# Patient Record
Sex: Male | Born: 1960 | Hispanic: No | Marital: Single | Smoking: Former smoker
Health system: Southern US, Community
[De-identification: ages and names within clinical notes are randomized; demographics above are authoritative.]

## PROBLEM LIST (undated history)

## (undated) DIAGNOSIS — J45909 Unspecified asthma, uncomplicated: Secondary | ICD-10-CM

## (undated) DIAGNOSIS — L409 Psoriasis, unspecified: Secondary | ICD-10-CM

## (undated) DIAGNOSIS — F1291 Cannabis use, unspecified, in remission: Secondary | ICD-10-CM

## (undated) DIAGNOSIS — I77810 Thoracic aortic ectasia: Secondary | ICD-10-CM

## (undated) DIAGNOSIS — I700 Atherosclerosis of aorta: Secondary | ICD-10-CM

## (undated) DIAGNOSIS — M47816 Spondylosis without myelopathy or radiculopathy, lumbar region: Secondary | ICD-10-CM

## (undated) DIAGNOSIS — Z87442 Personal history of urinary calculi: Secondary | ICD-10-CM

## (undated) DIAGNOSIS — I2510 Atherosclerotic heart disease of native coronary artery without angina pectoris: Secondary | ICD-10-CM

## (undated) DIAGNOSIS — G4730 Sleep apnea, unspecified: Secondary | ICD-10-CM

## (undated) DIAGNOSIS — J189 Pneumonia, unspecified organism: Secondary | ICD-10-CM

## (undated) DIAGNOSIS — E559 Vitamin D deficiency, unspecified: Secondary | ICD-10-CM

## (undated) DIAGNOSIS — I5189 Other ill-defined heart diseases: Secondary | ICD-10-CM

## (undated) DIAGNOSIS — R7303 Prediabetes: Secondary | ICD-10-CM

## (undated) DIAGNOSIS — Z8619 Personal history of other infectious and parasitic diseases: Secondary | ICD-10-CM

## (undated) DIAGNOSIS — M87051 Idiopathic aseptic necrosis of right femur: Secondary | ICD-10-CM

## (undated) DIAGNOSIS — N529 Male erectile dysfunction, unspecified: Secondary | ICD-10-CM

## (undated) DIAGNOSIS — A528 Late syphilis, latent: Secondary | ICD-10-CM

## (undated) DIAGNOSIS — L309 Dermatitis, unspecified: Secondary | ICD-10-CM

## (undated) DIAGNOSIS — D709 Neutropenia, unspecified: Secondary | ICD-10-CM

## (undated) DIAGNOSIS — Z87898 Personal history of other specified conditions: Secondary | ICD-10-CM

## (undated) DIAGNOSIS — M4185 Other forms of scoliosis, thoracolumbar region: Secondary | ICD-10-CM

## (undated) DIAGNOSIS — B20 Human immunodeficiency virus [HIV] disease: Secondary | ICD-10-CM

## (undated) DIAGNOSIS — F419 Anxiety disorder, unspecified: Secondary | ICD-10-CM

## (undated) DIAGNOSIS — R200 Anesthesia of skin: Secondary | ICD-10-CM

## (undated) DIAGNOSIS — J439 Emphysema, unspecified: Secondary | ICD-10-CM

## (undated) DIAGNOSIS — M545 Low back pain, unspecified: Secondary | ICD-10-CM

## (undated) DIAGNOSIS — M1990 Unspecified osteoarthritis, unspecified site: Secondary | ICD-10-CM

## (undated) DIAGNOSIS — E785 Hyperlipidemia, unspecified: Secondary | ICD-10-CM

## (undated) DIAGNOSIS — I10 Essential (primary) hypertension: Secondary | ICD-10-CM

## (undated) DIAGNOSIS — Z860100 Personal history of colon polyps, unspecified: Secondary | ICD-10-CM

## (undated) DIAGNOSIS — G8929 Other chronic pain: Secondary | ICD-10-CM

## (undated) DIAGNOSIS — J449 Chronic obstructive pulmonary disease, unspecified: Secondary | ICD-10-CM

## (undated) DIAGNOSIS — D649 Anemia, unspecified: Secondary | ICD-10-CM

## (undated) DIAGNOSIS — T7840XA Allergy, unspecified, initial encounter: Secondary | ICD-10-CM

## (undated) DIAGNOSIS — G2581 Restless legs syndrome: Secondary | ICD-10-CM

## (undated) DIAGNOSIS — G4700 Insomnia, unspecified: Secondary | ICD-10-CM

## (undated) DIAGNOSIS — M87052 Idiopathic aseptic necrosis of left femur: Secondary | ICD-10-CM

## (undated) DIAGNOSIS — A539 Syphilis, unspecified: Secondary | ICD-10-CM

## (undated) DIAGNOSIS — K635 Polyp of colon: Secondary | ICD-10-CM

## (undated) DIAGNOSIS — F32A Depression, unspecified: Secondary | ICD-10-CM

## (undated) HISTORY — DX: Hyperlipidemia, unspecified: E78.5

## (undated) HISTORY — DX: Unspecified asthma, uncomplicated: J45.909

## (undated) HISTORY — PX: EYE SURGERY: SHX253

## (undated) HISTORY — DX: Low back pain: M54.5

## (undated) HISTORY — DX: Restless legs syndrome: G25.81

## (undated) HISTORY — DX: Personal history of other infectious and parasitic diseases: Z86.19

## (undated) HISTORY — DX: Human immunodeficiency virus (HIV) disease: B20

## (undated) HISTORY — DX: Spondylosis without myelopathy or radiculopathy, lumbar region: M47.816

## (undated) HISTORY — DX: Polyp of colon: K63.5

## (undated) HISTORY — DX: Other forms of scoliosis, thoracolumbar region: M41.85

## (undated) HISTORY — DX: Personal history of colon polyps, unspecified: Z86.0100

## (undated) HISTORY — DX: Other chronic pain: G89.29

## (undated) HISTORY — DX: Emphysema, unspecified: J43.9

## (undated) HISTORY — DX: Insomnia, unspecified: G47.00

## (undated) HISTORY — DX: Low back pain, unspecified: M54.50

## (undated) HISTORY — DX: Syphilis, unspecified: A53.9

## (undated) HISTORY — DX: Personal history of colonic polyps: Z86.010

## (undated) HISTORY — DX: Allergy, unspecified, initial encounter: T78.40XA

---

## 1993-03-10 DIAGNOSIS — Z21 Asymptomatic human immunodeficiency virus [HIV] infection status: Secondary | ICD-10-CM

## 1993-03-10 DIAGNOSIS — B20 Human immunodeficiency virus [HIV] disease: Secondary | ICD-10-CM

## 1993-03-10 HISTORY — DX: Asymptomatic human immunodeficiency virus (hiv) infection status: Z21

## 1993-03-10 HISTORY — DX: Human immunodeficiency virus (HIV) disease: B20

## 1997-07-17 ENCOUNTER — Encounter: Admission: RE | Admit: 1997-07-17 | Discharge: 1997-07-17 | Payer: Self-pay | Admitting: Internal Medicine

## 1997-07-31 ENCOUNTER — Encounter: Admission: RE | Admit: 1997-07-31 | Discharge: 1997-07-31 | Payer: Self-pay | Admitting: Internal Medicine

## 2011-09-23 DIAGNOSIS — F4320 Adjustment disorder, unspecified: Secondary | ICD-10-CM | POA: Insufficient documentation

## 2012-01-19 HISTORY — PX: SPINAL CORD STIMULATOR IMPLANT: SHX2422

## 2012-01-21 HISTORY — PX: SPINE SURGERY: SHX786

## 2012-09-09 DIAGNOSIS — G8929 Other chronic pain: Secondary | ICD-10-CM | POA: Insufficient documentation

## 2012-09-09 DIAGNOSIS — M5136 Other intervertebral disc degeneration, lumbar region with discogenic back pain only: Secondary | ICD-10-CM

## 2012-09-09 DIAGNOSIS — M47816 Spondylosis without myelopathy or radiculopathy, lumbar region: Secondary | ICD-10-CM

## 2012-09-09 DIAGNOSIS — IMO0001 Reserved for inherently not codable concepts without codable children: Secondary | ICD-10-CM | POA: Insufficient documentation

## 2012-09-09 HISTORY — DX: Other intervertebral disc degeneration, lumbar region with discogenic back pain only: M51.360

## 2012-09-09 HISTORY — DX: Other intervertebral disc degeneration, lumbar region: M51.36

## 2012-09-09 HISTORY — DX: Spondylosis without myelopathy or radiculopathy, lumbar region: M47.816

## 2013-01-08 DIAGNOSIS — R561 Post traumatic seizures: Secondary | ICD-10-CM

## 2013-01-08 HISTORY — DX: Post traumatic seizures: R56.1

## 2013-04-19 DIAGNOSIS — Z87820 Personal history of traumatic brain injury: Secondary | ICD-10-CM | POA: Insufficient documentation

## 2013-04-19 HISTORY — DX: Personal history of traumatic brain injury: Z87.820

## 2013-10-26 ENCOUNTER — Other Ambulatory Visit: Payer: Self-pay | Admitting: Pain Medicine

## 2013-10-26 ENCOUNTER — Ambulatory Visit: Payer: Self-pay | Admitting: Pain Medicine

## 2013-10-26 LAB — BASIC METABOLIC PANEL
Anion Gap: 6 — ABNORMAL LOW (ref 7–16)
BUN: 8 mg/dL (ref 7–18)
Calcium, Total: 8.9 mg/dL (ref 8.5–10.1)
Chloride: 106 mmol/L (ref 98–107)
Co2: 31 mmol/L (ref 21–32)
Creatinine: 1.03 mg/dL (ref 0.60–1.30)
EGFR (African American): >60
EGFR (Non-African Amer.): >60
Glucose: 90 mg/dL (ref 65–99)
Osmolality: 283 (ref 275–301)
Potassium: 4 mmol/L (ref 3.5–5.1)
Sodium: 143 mmol/L (ref 136–145)

## 2013-10-26 LAB — HEPATIC FUNCTION PANEL A (ARMC)
Albumin: 4.1 g/dL (ref 3.4–5.0)
Alkaline Phosphatase: 98 U/L
Bilirubin, Direct: 0.1 mg/dL (ref 0.00–0.20)
Bilirubin,Total: 0.6 mg/dL (ref 0.2–1.0)
SGOT(AST): 30 U/L (ref 15–37)
SGPT (ALT): 21 U/L
Total Protein: 8.2 g/dL (ref 6.4–8.2)

## 2013-10-26 LAB — SEDIMENTATION RATE: Erythrocyte Sed Rate: 2 mm/hr (ref 0–20)

## 2013-10-26 LAB — MAGNESIUM: Magnesium: 2.2 mg/dL

## 2013-12-19 ENCOUNTER — Ambulatory Visit: Payer: Self-pay | Admitting: Pain Medicine

## 2014-01-02 ENCOUNTER — Ambulatory Visit: Payer: Self-pay | Admitting: Pain Medicine

## 2014-02-20 ENCOUNTER — Ambulatory Visit: Payer: Self-pay | Admitting: Family Medicine

## 2014-03-17 ENCOUNTER — Ambulatory Visit: Admit: 2014-03-17 | Disposition: A | Discharge status: Home / Self Care | Payer: Self-pay | Admitting: Pain Medicine

## 2014-04-06 DIAGNOSIS — L209 Atopic dermatitis, unspecified: Secondary | ICD-10-CM | POA: Insufficient documentation

## 2014-05-12 LAB — HEMOGLOBIN A1C: Hgb A1c MFr Bld: 6.1 % — AB (ref 4.0–6.0)

## 2014-05-12 LAB — LIPID PANEL
Cholesterol: 237 mg/dL — AB (ref 0–200)
HDL: 48 mg/dL (ref 35–70)
LDL Cholesterol: 170 mg/dL
Triglycerides: 96 mg/dL (ref 40–160)

## 2014-09-04 ENCOUNTER — Other Ambulatory Visit: Payer: Self-pay | Admitting: Family Medicine

## 2014-09-04 DIAGNOSIS — M545 Low back pain: Principal | ICD-10-CM

## 2014-09-04 DIAGNOSIS — G8929 Other chronic pain: Secondary | ICD-10-CM

## 2014-09-12 ENCOUNTER — Encounter: Payer: Self-pay | Admitting: Family Medicine

## 2014-09-12 ENCOUNTER — Ambulatory Visit (INDEPENDENT_AMBULATORY_CARE_PROVIDER_SITE_OTHER): Payer: Medicare Other | Admitting: Family Medicine

## 2014-09-12 VITALS — BP 134/88 | HR 103 | Temp 98.8°F | Resp 18 | Ht 69.2 in | Wt 162.3 lb

## 2014-09-12 DIAGNOSIS — J309 Allergic rhinitis, unspecified: Secondary | ICD-10-CM | POA: Insufficient documentation

## 2014-09-12 DIAGNOSIS — Z720 Tobacco use: Secondary | ICD-10-CM

## 2014-09-12 DIAGNOSIS — R05 Cough: Secondary | ICD-10-CM

## 2014-09-12 DIAGNOSIS — M419 Scoliosis, unspecified: Secondary | ICD-10-CM | POA: Insufficient documentation

## 2014-09-12 DIAGNOSIS — F17200 Nicotine dependence, unspecified, uncomplicated: Secondary | ICD-10-CM

## 2014-09-12 DIAGNOSIS — E785 Hyperlipidemia, unspecified: Secondary | ICD-10-CM | POA: Insufficient documentation

## 2014-09-12 DIAGNOSIS — B20 Human immunodeficiency virus [HIV] disease: Secondary | ICD-10-CM

## 2014-09-12 DIAGNOSIS — E1169 Type 2 diabetes mellitus with other specified complication: Secondary | ICD-10-CM | POA: Insufficient documentation

## 2014-09-12 DIAGNOSIS — E559 Vitamin D deficiency, unspecified: Secondary | ICD-10-CM | POA: Insufficient documentation

## 2014-09-12 DIAGNOSIS — E782 Mixed hyperlipidemia: Secondary | ICD-10-CM | POA: Insufficient documentation

## 2014-09-12 DIAGNOSIS — Z21 Asymptomatic human immunodeficiency virus [HIV] infection status: Secondary | ICD-10-CM | POA: Insufficient documentation

## 2014-09-12 DIAGNOSIS — R059 Cough, unspecified: Secondary | ICD-10-CM

## 2014-09-12 DIAGNOSIS — G2581 Restless legs syndrome: Secondary | ICD-10-CM | POA: Insufficient documentation

## 2014-09-12 DIAGNOSIS — F32A Depression, unspecified: Secondary | ICD-10-CM

## 2014-09-12 DIAGNOSIS — F329 Major depressive disorder, single episode, unspecified: Secondary | ICD-10-CM | POA: Insufficient documentation

## 2014-09-12 DIAGNOSIS — N529 Male erectile dysfunction, unspecified: Secondary | ICD-10-CM | POA: Insufficient documentation

## 2014-09-12 DIAGNOSIS — F419 Anxiety disorder, unspecified: Secondary | ICD-10-CM

## 2014-09-12 HISTORY — DX: Depression, unspecified: F32.A

## 2014-09-12 MED ORDER — HYDROCOD POLST-CPM POLST ER 10-8 MG/5ML PO SUER: 5.0000 mL | Freq: Every evening | ORAL | Status: DC | PRN

## 2014-09-12 MED ORDER — FLUTICASONE FUROATE-VILANTEROL 100-25 MCG/INH IN AEPB: 1.0000 | INHALATION_SPRAY | Freq: Once | RESPIRATORY_TRACT | Status: DC

## 2014-09-12 MED ORDER — MOXIFLOXACIN HCL 400 MG PO TABS: 400.0000 mg | ORAL_TABLET | Freq: Every day | ORAL | Status: DC

## 2014-09-12 MED ORDER — BENZONATATE 100 MG PO CAPS: 100.0000 mg | ORAL_CAPSULE | Freq: Three times a day (TID) | ORAL | Status: DC | PRN

## 2014-09-12 NOTE — Addendum Note (Signed)
Addended by: Inda Coke on: 09/12/2014 04:36 PM   Modules accepted: Orders, Medications

## 2014-09-12 NOTE — Progress Notes (Signed)
Name: Samuel Burnett   MRN: [id number]    DOB: 10/13/1960   Date:09/12/2014       Progress Note  Subjective  Chief Complaint  Chief Complaint  Patient presents with  . 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 He is a smoker, not diagnosed with COPD.  He developed cold symptoms about 5 days ago. He is now having a productive cough, SOB, some pleuritic chest pain. He denies a fever. He is allergic to penicillin. No sick contacts.  Patient Active Problem List   Diagnosis Date Noted  . Allergic rhinitis 09/12/2014  . Cannot sleep 09/12/2014  . Anxiety and depression 09/12/2014  . ED (erectile dysfunction) of organic origin 09/12/2014  . HIV (human immunodeficiency virus infection) 09/12/2014  . HLD (hyperlipidemia) 09/12/2014  . BP (high blood pressure) 09/12/2014  . Degenerative arthritis of lumbar spine 09/12/2014  . Restless leg 09/12/2014  . Current tobacco use 09/12/2014  . Avitaminosis D 09/12/2014  . AD (atopic dermatitis) 04/06/2014  . History of concussion 04/19/2013  . LBP (low back pain) 09/09/2012    Past Surgical History  Procedure Laterality Date  . Spine surgery  01.01.13  . Eye surgery  age 58    uncross eyes    Family History  Problem Relation Age of Onset  . Lupus Mother   . Cancer Father     Pancreatis  . Diabetes Sister   . Diabetes Brother     History   Social History  . Marital Status: Single    Spouse Name: N/A  . Number of Children: N/A  . Years of Education: N/A   Occupational History  . Not on file.   Social History Main Topics  . Smoking status: Current Every Day Smoker -- 0.50 packs/day for 30 years    Types: Cigarettes    Start date: 09/11/1984  . Smokeless tobacco: Never Used  . Alcohol Use: No  . Drug Use: No  . Sexual Activity: No    Other Topics Concern  . Not on file   Social History Narrative     Current outpatient prescriptions:  .  HYDROCORTISONE, TOPICAL, 1 % SOLN, Apply 1 application topically as needed., Disp: , Rfl:  .  amLODipine (NORVASC) 10 MG tablet, Take by mouth., Disp: , Rfl:  .  chlorpheniramine-HYDROcodone (TUSSIONEX PENNKINETIC ER) 10-8 MG/5ML SUER, Take 5 mLs by mouth at bedtime as needed for cough (up to twice daily)., Disp: 140 mL, Rfl: 0 .  Cholecalciferol (VITAMIN D3) 50000 UNITS CAPS, Take by mouth., Disp: , Rfl:  .  DULoxetine (CYMBALTA) 60 MG capsule, Take by mouth., Disp: , Rfl:  .  Ergocalciferol 2000 UNITS TABS, Take by mouth., Disp: , Rfl:  .  fluticasone (FLONASE) 50 MCG/ACT nasal spray, Place into the nose., Disp: , Rfl:  .  Fluticasone Furoate-Vilanterol (BREO ELLIPTA) 100-25 MCG/INH AEPB, Inhale 1 puff into the lungs once., Disp: 1 each, Rfl: 0 .  gabapentin (NEURONTIN) 300 MG capsule, Take by mouth., Disp: , Rfl:  .  Hydrocortisone Acetate 1 % CREA, HYDROCORTISONE ACETATE, 1% (External Cream) - Historical Medication  (1 %) Active, Disp: , Rfl:  .  ibuprofen (ADVIL,MOTRIN) 800 MG tablet, Take by mouth., Disp: , Rfl:  .  meloxicam (MOBIC) 15 MG tablet, TAKE  1 TABLET ORALLY ONCE A DAY, Disp: 30 tablet, Rfl: 4 .  mirtazapine (REMERON) 15 MG tablet, Take by mouth., Disp: , Rfl:  .  moxifloxacin (AVELOX) 400 MG tablet, Take 1 tablet (400 mg total) by mouth daily., Disp: 7 tablet, Rfl: 0 .  oxyCODONE-acetaminophen (PERCOCET) 5-325 MG per tablet, Take by mouth., Disp: , Rfl:  .  pravastatin (PRAVACHOL) 20 MG tablet, Take by mouth., Disp: , Rfl:  .  rOPINIRole (REQUIP) 1 MG tablet, Take by mouth., Disp: , Rfl:   Allergies  Allergen Reactions  . Fentanyl Shortness Of Breath  . Aspirin Nausea And Vomiting  . Penicillin G Swelling    Other reaction(s): Difficulty breathing     ROS  Constitutional: Negative for fever or weight change.  Respiratory: Positive  for cough and shortness  of breath.   Cardiovascular: Positive  for chest pain - pleuritic  or palpitations.  Gastrointestinal: Negative for abdominal pain, no bowel changes.  Musculoskeletal: Negative for gait problem or joint swelling. Chronic back pain Skin: Negative for rash.  Neurological: Negative for dizziness or headache.  No other specific complaints in a complete review of systems (except as listed in HPI above).  Objective  Filed Vitals:   09/12/14 1449  BP: 134/88  Pulse: 103  Temp: 98.8 F (37.1 C)  TempSrc: Oral  Resp: 18  Height: 5' 9.2" (1.758 m)  Weight: 162 lb 4.8 oz (73.619 kg)  SpO2: 97%    Body mass index is 23.82 kg/(m^2).  Physical Exam  Constitutional: Patient appears well-developed and well-nourished. No distress. Thin Eyes:  No scleral icterus. PERL Neck: Normal range of motion. Neck supple. Cardiovascular: Normal rate, regular rhythm and normal heart sounds.  No murmur heard. No BLE edema. Pulmonary/Chest: Effort normal , breath sounds shows some rhonchi both lung fields. No respiratory distress. Abdominal: Soft.  There is no tenderness. Psychiatric: Patient has a normal mood and affect. behavior is normal. Judgment and thought content normal.   PHQ2/9: Depression screen PHQ 2/9 09/12/2014  Decreased Interest 0  Down, Depressed, Hopeless 0  PHQ - 2 Score 0     Fall Risk: Fall Risk  09/12/2014  Falls in the past year? No    Assessment & Plan  1. Cough Discussed risk of tendinitis  with Avelox - Fluticasone Furoate-Vilanterol (BREO ELLIPTA) 100-25 MCG/INH AEPB; Inhale 1 puff into the lungs once.  Dispense: 1 each; Refill: 0 - moxifloxacin (AVELOX) 400 MG tablet; Take 1 tablet (400 mg total) by mouth daily.  Dispense: 7 tablet; Refill: 0 - chlorpheniramine-HYDROcodone (TUSSIONEX PENNKINETIC ER) 10-8 MG/5ML SUER; Take 5 mLs by mouth at bedtime as needed for cough (up to twice daily).  Dispense: 140 mL; Refill: 0  2. Tobacco smoker within last 12 months  Discussed  importance of quitting  3. HIV (human immunodeficiency virus infection)  Continue follow up with ID, offered to get CD4 and CXR, but he wants to hold off at this time

## 2014-09-20 ENCOUNTER — Ambulatory Visit: Payer: Medicare Other | Admitting: Family Medicine

## 2014-09-21 ENCOUNTER — Ambulatory Visit: Payer: Medicare Other | Admitting: Family Medicine

## 2014-09-21 ENCOUNTER — Ambulatory Visit (INDEPENDENT_AMBULATORY_CARE_PROVIDER_SITE_OTHER): Payer: Medicare Other | Admitting: Family Medicine

## 2014-09-21 ENCOUNTER — Ambulatory Visit
Admission: RE | Admit: 2014-09-21 | Discharge: 2014-09-21 | Disposition: A | Discharge status: Home / Self Care | Payer: Medicare Other | Source: Ambulatory Visit | Attending: Family Medicine | Admitting: Family Medicine

## 2014-09-21 ENCOUNTER — Encounter: Payer: Self-pay | Admitting: Family Medicine

## 2014-09-21 VITALS — BP 120/70 | HR 93 | Temp 98.7°F | Resp 18 | Ht 69.2 in | Wt 160.1 lb

## 2014-09-21 DIAGNOSIS — Z21 Asymptomatic human immunodeficiency virus [HIV] infection status: Secondary | ICD-10-CM

## 2014-09-21 DIAGNOSIS — B20 Human immunodeficiency virus [HIV] disease: Secondary | ICD-10-CM

## 2014-09-21 DIAGNOSIS — M4726 Other spondylosis with radiculopathy, lumbar region: Secondary | ICD-10-CM

## 2014-09-21 DIAGNOSIS — J40 Bronchitis, not specified as acute or chronic: Secondary | ICD-10-CM | POA: Insufficient documentation

## 2014-09-21 DIAGNOSIS — J209 Acute bronchitis, unspecified: Secondary | ICD-10-CM

## 2014-09-21 DIAGNOSIS — R05 Cough: Secondary | ICD-10-CM | POA: Diagnosis not present

## 2014-09-21 HISTORY — DX: Bronchitis, not specified as acute or chronic: J40

## 2014-09-21 MED ORDER — PREDNISONE 5 MG (21) PO TBPK: 5.0000 mg | ORAL_TABLET | Freq: Every day | ORAL | Status: DC

## 2014-09-21 MED ORDER — OXYCODONE-ACETAMINOPHEN 5-325 MG PO TABS: 1.0000 | ORAL_TABLET | Freq: Three times a day (TID) | ORAL | Status: DC | PRN

## 2014-09-21 MED ORDER — LEVOFLOXACIN 750 MG PO TABS: 750.0000 mg | ORAL_TABLET | Freq: Every day | ORAL | Status: DC

## 2014-09-21 NOTE — Progress Notes (Signed)
Name: Samuel Burnett   MRN: [id number]    DOB: 11/09/1960   Date:09/21/2014       Progress Note  Subjective  Chief Complaint  Chief Complaint  Patient presents with  . Follow-up    patient is here because his sx has not improved and he now has gray phlem  . Pain    patient has a stimilator in his spine but has been without his pain medicaiton    HPI  Chest congestion: Patient is here today with concerns regarding the following symptoms congestion, productive cough and achiness that started about 2 weeks ago.  Associated with fatigue and malaise. Patient's symptoms has not improved with the prescribed treatment, Avelox 400mg  one a day for 7 days and Breo inhaler. Patient stated that he now has thick gray mucus when he coughs. Patient states that he has this every time he visits his son.   Joint/Muscle Pain: Patient complains of arthralgias for which has been present for several years. Pain is located in lumbar spine, is described as aching, boring and throbbing, and is constant, moderate .  Associated symptoms include: decreased range of motion.  The patient has tried cold, heat, NSAIDs, position change, rest and opioid medication for pain, with moderate relief.  Related to injury:   not applicable. Has been working on securing pain specialist and spine specialist appointment regarding his implanted spinal stimulation device and they are currently awaiting his previous records to then schedule his appointment. He is requesting a refill on his pain medication until he sees the specialist.     Past Medical History  Diagnosis Date  . Controlled insomnia   . Osteoarthritis of lumbar spine   . Hyperlipidemia   . Other forms of scoliosis, thoracolumbar region   . HIV infection   . Restless leg syndrome   . Allergy     History  Substance Use Topics  . Smoking status: Current Every Day Smoker -- 0.50 packs/day for 30 years    Types: Cigarettes    Start date: 09/11/1984  . Smokeless  tobacco: Never Used  . Alcohol Use: No     Current outpatient prescriptions:  .  amLODipine (NORVASC) 10 MG tablet, Take by mouth., Disp: , Rfl:  .  benzonatate (TESSALON PERLES) 100 MG capsule, Take 1 capsule (100 mg total) by mouth 3 (three) times daily as needed for cough., Disp: 20 capsule, Rfl: 0 .  Cholecalciferol (VITAMIN D3) 50000 UNITS CAPS, Take by mouth., Disp: , Rfl:  .  DULoxetine (CYMBALTA) 60 MG capsule, Take by mouth., Disp: , Rfl:  .  Ergocalciferol 2000 UNITS TABS, Take by mouth., Disp: , Rfl:  .  fluticasone (FLONASE) 50 MCG/ACT nasal spray, Place into the nose., Disp: , Rfl:  .  Fluticasone Furoate-Vilanterol (BREO ELLIPTA) 100-25 MCG/INH AEPB, Inhale 1 puff into the lungs once., Disp: 1 each, Rfl: 0 .  gabapentin (NEURONTIN) 300 MG capsule, Take by mouth., Disp: , Rfl:  .  Hydrocortisone Acetate 1 % CREA, HYDROCORTISONE ACETATE, 1% (External Cream) - Historical Medication  (1 %) Active, Disp: , Rfl:  .  HYDROCORTISONE, TOPICAL, 1 % SOLN, Apply 1 application topically as needed., Disp: , Rfl:  .  ibuprofen (ADVIL,MOTRIN) 800 MG tablet, Take by mouth., Disp: , Rfl:  .  meloxicam (MOBIC) 15 MG tablet, TAKE 1 TABLET ORALLY ONCE A DAY, Disp: 30 tablet, Rfl: 4 .  mirtazapine (REMERON) 15 MG tablet, Take by mouth., Disp: , Rfl:  .  moxifloxacin (AVELOX) 400 MG tablet,  Take 1 tablet (400 mg total) by mouth daily., Disp: 7 tablet, Rfl: 0 .  oxyCODONE-acetaminophen (PERCOCET) 5-325 MG per tablet, Take by mouth., Disp: , Rfl:  .  pravastatin (PRAVACHOL) 20 MG tablet, Take by mouth., Disp: , Rfl:  .  rOPINIRole (REQUIP) 1 MG tablet, Take by mouth., Disp: , Rfl:   Allergies  Allergen Reactions  . Fentanyl Shortness Of Breath  . Aspirin Nausea And Vomiting  . Penicillin G Swelling    Other reaction(s): Difficulty breathing    ROS  CONSTITUTIONAL: No significant weight changes, fever, chills, weakness or fatigue.  HEENT:  - Eyes: No visual changes.  - Ears: No auditory  changes. No pain.  - Nose: No sneezing, congestion, runny nose. - Throat: No sore throat. No changes in swallowing. SKIN: No rash or itching.  CARDIOVASCULAR: No chest pain, chest pressure or chest discomfort. No palpitations or edema.  RESPIRATORY: Yes coughing with sputum production, chest tightness with deep breaths, wheezing left side.  GASTROINTESTINAL: No anorexia, nausea, vomiting. No changes in bowel habits. No abdominal pain or blood.  GENITOURINARY: No dysuria. No frequency. No discharge. NEUROLOGICAL: No headache, dizziness, syncope, paralysis, ataxia, numbness or tingling in the extremities. No memory changes. No change in bowel or bladder control.  MUSCULOSKELETAL: Chronic lumbar back pain. No muscle pain. HEMATOLOGIC: No anemia, bleeding or bruising.  LYMPHATICS: No enlarged lymph nodes.  PSYCHIATRIC: No change in mood. No change in sleep pattern.  ENDOCRINOLOGIC: No reports of sweating, cold or heat intolerance. No polyuria or polydipsia.      Objective  Filed Vitals:   09/21/14 1109  BP: 120/70  Pulse: 93  Temp: 98.7 F (37.1 C)  TempSrc: Oral  Resp: 18  Height: 5' 9.2" (1.758 m)  Weight: 160 lb 1.6 oz (72.621 kg)  SpO2: 96%   Body mass index is 23.5 kg/(m^2).   Physical Exam  Constitutional: Patient appears well-developed and well-nourished. In no acute distress but does appear to be fatigued from acute illness. HEENT:  - Head: Normocephalic and atraumatic.  - Ears: RIGHT TM bulging with minimal clear exudate, LEFT TM bulging with minimal clear exudate.  - Nose: Nasal mucosa boggy and congested.  - Mouth/Throat: Oropharynx is moist with slight erythema of bilateral tonsils without hypertrophy or exudates. Post nasal drainage present.  - Eyes: Conjunctivae clear, EOM movements normal. PERRLA. No scleral icterus.  Neck: Normal range of motion. Neck supple. No JVD present. No thyromegaly present. No local lymphadenopathy. Cardiovascular: Regular rate,  regular rhythm with no murmurs heard.  Pulmonary/Chest: Effort normal and breath sounds clear with exception to left lower lateral postero-lateral lung field with decreased breath sounds.  Musculoskeletal: Normal range of motion bilateral UE and LE, no joint effusions. Lumbar spine with no palpable step off or point tenderness over spinal column. Skin: Skin is warm and dry. No rash noted. Psychiatric: Patient has a normal mood and affect. Behavior is normal in office today. Judgment and thought content normal in office today.   Assessment & Plan  1. Osteoarthritis of spine with radiculopathy, lumbar region Refilled pain medication.   - oxyCODONE-acetaminophen (PERCOCET) 5-325 MG per tablet; Take 1 tablet by mouth every 8 (eight) hours as needed for severe pain.  Dispense: 60 tablet; Refill: 0  2. Bronchitis with bronchospasm Ongoing symptoms. Will switch patient to high dose Levofloxacin along with steroid taper and get CXR.   - predniSONE (STERAPRED UNI-PAK 21 TAB) 5 MG (21) TBPK tablet; Take 1 tablet (5 mg total) by mouth daily.  Use as directed in a 6 day taper PredPak  Dispense: 21 tablet; Refill: 1 - levofloxacin (LEVAQUIN) 750 MG tablet; Take 1 tablet (750 mg total) by mouth daily.  Dispense: 10 tablet; Refill: 0 - DG Chest 2 View; Future  3. HIV (human immunodeficiency virus infection) At risk for rare pathogens, encouraged patient to f/u with specialist of symptoms do not improve.

## 2014-10-05 DIAGNOSIS — Z113 Encounter for screening for infections with a predominantly sexual mode of transmission: Secondary | ICD-10-CM

## 2014-10-05 HISTORY — DX: Encounter for screening for infections with a predominantly sexual mode of transmission: Z11.3

## 2014-11-12 ENCOUNTER — Other Ambulatory Visit: Payer: Self-pay | Admitting: Family Medicine

## 2014-11-15 ENCOUNTER — Encounter: Payer: Self-pay | Admitting: Family Medicine

## 2014-11-15 ENCOUNTER — Telehealth: Payer: Self-pay | Admitting: Family Medicine

## 2014-11-15 ENCOUNTER — Ambulatory Visit (INDEPENDENT_AMBULATORY_CARE_PROVIDER_SITE_OTHER): Payer: Medicare Other | Admitting: Family Medicine

## 2014-11-15 ENCOUNTER — Other Ambulatory Visit: Payer: Self-pay | Admitting: Family Medicine

## 2014-11-15 VITALS — BP 118/72 | HR 85 | Temp 97.8°F | Resp 16 | Ht 69.0 in | Wt 155.3 lb

## 2014-11-15 DIAGNOSIS — G8929 Other chronic pain: Secondary | ICD-10-CM

## 2014-11-15 DIAGNOSIS — Z125 Encounter for screening for malignant neoplasm of prostate: Secondary | ICD-10-CM | POA: Diagnosis not present

## 2014-11-15 DIAGNOSIS — Z136 Encounter for screening for cardiovascular disorders: Secondary | ICD-10-CM | POA: Diagnosis not present

## 2014-11-15 DIAGNOSIS — Z2821 Immunization not carried out because of patient refusal: Secondary | ICD-10-CM

## 2014-11-15 DIAGNOSIS — Z13220 Encounter for screening for lipoid disorders: Secondary | ICD-10-CM | POA: Diagnosis not present

## 2014-11-15 DIAGNOSIS — Z113 Encounter for screening for infections with a predominantly sexual mode of transmission: Secondary | ICD-10-CM

## 2014-11-15 DIAGNOSIS — B20 Human immunodeficiency virus [HIV] disease: Secondary | ICD-10-CM

## 2014-11-15 DIAGNOSIS — Z0000 Encounter for general adult medical examination without abnormal findings: Secondary | ICD-10-CM

## 2014-11-15 DIAGNOSIS — Z1211 Encounter for screening for malignant neoplasm of colon: Secondary | ICD-10-CM | POA: Diagnosis not present

## 2014-11-15 DIAGNOSIS — Z8619 Personal history of other infectious and parasitic diseases: Secondary | ICD-10-CM | POA: Insufficient documentation

## 2014-11-15 DIAGNOSIS — M5416 Radiculopathy, lumbar region: Secondary | ICD-10-CM

## 2014-11-15 DIAGNOSIS — Z7689 Persons encountering health services in other specified circumstances: Secondary | ICD-10-CM | POA: Diagnosis not present

## 2014-11-15 DIAGNOSIS — Z21 Asymptomatic human immunodeficiency virus [HIV] infection status: Secondary | ICD-10-CM

## 2014-11-15 DIAGNOSIS — Z720 Tobacco use: Secondary | ICD-10-CM

## 2014-11-15 DIAGNOSIS — IMO0001 Reserved for inherently not codable concepts without codable children: Secondary | ICD-10-CM

## 2014-11-15 DIAGNOSIS — A539 Syphilis, unspecified: Secondary | ICD-10-CM

## 2014-11-15 DIAGNOSIS — F17210 Nicotine dependence, cigarettes, uncomplicated: Secondary | ICD-10-CM

## 2014-11-15 MED ORDER — BUPROPION HCL ER (SR) 150 MG PO TB12: 150.0000 mg | ORAL_TABLET | Freq: Two times a day (BID) | ORAL | Status: DC

## 2014-11-15 NOTE — Addendum Note (Signed)
Addended by: Bobetta Lime on: 11/15/2014 01:21 PM   Modules accepted: Miquel Dunn

## 2014-11-15 NOTE — Telephone Encounter (Signed)
Spoke with Naaman Plummer at Dr. Delfino Lovett Frothingham's office to get a follow up appt for this patient's HIV & Syphilis management. Patient was given an appt for 12/07/14 @12 :30pm.  A message was left on his voicemail with his appt date and time on it.

## 2014-11-15 NOTE — Telephone Encounter (Signed)
Please contact offices of   Dr. Delma Post, MD Infectious Disease Medicine  Male & Revonda Humphrey a Review  [place] [address] [place], [place] [address] Get Directions  [phone]  And make a follow up appointment for Zenovia Jarred for HIV, Syphilis management. He missed his appointment 09/2014.

## 2014-11-15 NOTE — Progress Notes (Signed)
Name: Samuel Burnett   MRN: [id number]    DOB: [birth year]   Date:11/15/2014       Progress Note  Subjective  Chief Complaint  Chief Complaint  Patient presents with  . Annual Exam    HPI  Patient is here today for a Complete Male Physical Exam:  The patient has no acute concerns. Overall feels health needs are stable. Diet is well balanced. In general does exercise regularly. Sees dentist regularly and addresses vision concerns with ophthalmologist if applicable. In regards to sexual activity the patient is currently not sexually active for 1 year now. He reports being bi-sexual. Currently is not concerned about exposure to any STDs but would like his urine tested. Current ever day cigarette smoker 1/2-1 ppd for many years. Interested in smoking cessation as OTC methods such as patches, gum, lozenges not helpful or cost effective. Patient complains of pain in lower back for which has been present for several years. Pain is located in lumbar spine, is described as aching, boring and throbbing, and is constant, moderate . Associated symptoms include: decreased range of motion. The patient has tried cold, heat, NSAIDs, position change, rest, invasive therapies, implanted stimulator and opioid medication for pain, with moderate relief. Related to injury: not applicable. Has consulted with spine specialist and brings me all relevant documents for review. Dr. Lauris Poag office visit note from 06/15/14 is included.    Past Medical History  Diagnosis Date  . Controlled insomnia   . Osteoarthritis of lumbar spine   . Hyperlipidemia   . Other forms of scoliosis, thoracolumbar region   . HIV infection   . Restless leg syndrome   . Allergy     Past Surgical History  Procedure Laterality Date  . Spine surgery  01.01.13  . Eye surgery  age 71    uncross eyes    Family History  Problem Relation Age of Onset  . Lupus Mother   . Cancer Father     Pancreatis  . Diabetes Sister   . Diabetes  Brother     Social History   Social History  . Marital Status: Single    Spouse Name: N/A  . Number of Children: N/A  . Years of Education: N/A   Occupational History  . Not on file.   Social History Main Topics  . Smoking status: Current Every Day Smoker -- 0.50 packs/day for 30 years    Types: Cigarettes    Start date: 09/11/1984  . Smokeless tobacco: Never Used  . Alcohol Use: No  . Drug Use: No  . Sexual Activity: No   Other Topics Concern  . Not on file   Social History Narrative     Current outpatient prescriptions:  .  amLODipine (NORVASC) 10 MG tablet, Take by mouth., Disp: , Rfl:  .  Cholecalciferol (VITAMIN D3) 50000 UNITS CAPS, Take by mouth., Disp: , Rfl:  .  DULoxetine (CYMBALTA) 60 MG capsule, Take by mouth., Disp: , Rfl:  .  Ergocalciferol 2000 UNITS TABS, Take by mouth., Disp: , Rfl:  .  fluticasone (FLONASE) 50 MCG/ACT nasal spray, Place into the nose., Disp: , Rfl:  .  Fluticasone Furoate-Vilanterol (BREO ELLIPTA) 100-25 MCG/INH AEPB, Inhale 1 puff into the lungs once., Disp: 1 each, Rfl: 0 .  gabapentin (NEURONTIN) 300 MG capsule, Take by mouth., Disp: , Rfl:  .  Hydrocortisone Acetate 1 % CREA, HYDROCORTISONE ACETATE, 1% (External Cream) - Historical Medication  (1 %) Active, Disp: , Rfl:  .  HYDROCORTISONE,  TOPICAL, 1 % SOLN, Apply 1 application topically as needed., Disp: , Rfl:  .  meloxicam (MOBIC) 15 MG tablet, TAKE 1 TABLET ORALLY ONCE A DAY, Disp: 30 tablet, Rfl: 4 .  mirtazapine (REMERON) 15 MG tablet, Take by mouth., Disp: , Rfl:  .  moxifloxacin (AVELOX) 400 MG tablet, Take 1 tablet (400 mg total) by mouth daily., Disp: 7 tablet, Rfl: 0 .  oxyCODONE-acetaminophen (PERCOCET) 5-325 MG per tablet, Take 1 tablet by mouth every 8 (eight) hours as needed for severe pain., Disp: 60 tablet, Rfl: 0 .  pravastatin (PRAVACHOL) 20 MG tablet, Take by mouth., Disp: , Rfl:  .  predniSONE (STERAPRED UNI-PAK 21 TAB) 5 MG (21) TBPK tablet, Take 1 tablet (5 mg  total) by mouth daily. Use as directed in a 6 day taper PredPak, Disp: 21 tablet, Rfl: 1 .  rOPINIRole (REQUIP) 1 MG tablet, Take by mouth., Disp: , Rfl:   Allergies  Allergen Reactions  . Fentanyl Shortness Of Breath  . Aspirin Nausea And Vomiting  . Penicillin G Swelling    Other reaction(s): Difficulty breathing    ROS  CONSTITUTIONAL: No significant weight changes, fever, chills, weakness or fatigue.  HEENT:  - Eyes: No visual changes.  - Ears: No auditory changes. No pain.  - Nose: No sneezing, congestion, runny nose. - Throat: No sore throat. No changes in swallowing. SKIN: No rash or itching.  CARDIOVASCULAR: No chest pain, chest pressure or chest discomfort. No palpitations or edema.  RESPIRATORY: No shortness of breath, cough or sputum.  GASTROINTESTINAL: No anorexia, nausea, vomiting. No changes in bowel habits. No abdominal pain or blood.  GENITOURINARY: No dysuria. No frequency. No discharge.  NEUROLOGICAL: No headache, dizziness, syncope, paralysis, ataxia, numbness or tingling in the extremities. No memory changes. No change in bowel or bladder control.  MUSCULOSKELETAL: Chronic back joint pain. No muscle pain. HEMATOLOGIC: No anemia, bleeding or bruising.  LYMPHATICS: No enlarged lymph nodes.  PSYCHIATRIC: No change in mood. No change in sleep pattern.  ENDOCRINOLOGIC: No reports of sweating, cold or heat intolerance. No polyuria or polydipsia.   Objective  Filed Vitals:   11/15/14 1207  BP: 118/72  Pulse: 85  Temp: 97.8 F (36.6 C)  TempSrc: Oral  Resp: 16  Height: 5\' 9"  (1.753 m)  Weight: 155 lb 4.8 oz (70.444 kg)  SpO2: 94%   Body mass index is 22.92 kg/(m^2).  Depression screen [facility] 2/9 11/15/2014 09/12/2014  Decreased Interest 0 0  Down, Depressed, Hopeless 0 0  PHQ - 2 Score 0 0     Physical Exam  Constitutional: Patient appears well-developed and well-nourished. In no distress.  HEENT:  - Head: Normocephalic and atraumatic.  - Ears:  Bilateral TMs gray, no erythema or effusion - Nose: Nasal mucosa moist - Mouth/Throat: Oropharynx is clear and moist. No tonsillar hypertrophy or erythema. No post nasal drainage.  - Eyes: Conjunctivae clear, EOM movements normal. PERRLA. No scleral icterus.  Neck: Normal range of motion. Neck supple. No JVD present. No thyromegaly present.  Cardiovascular: Normal rate, regular rhythm and normal heart sounds.  No murmur heard.  Pulmonary/Chest: Effort normal and breath sounds normal. No respiratory distress. Abdominal: Soft. Bowel sounds are normal, no distension. There is no tenderness. no masses BREAST: Bilateral breast exam normal with no masses, skin changes or nipple discharge MALE GENITALIA: Bilateral testes descended with no masses, no penile lesions, no penile discharge. PROSTATE: Normal prostate size and consistency. RECTAL: No rectal masses or hemorrhoids, prostate size normal. Musculoskeletal:  Normal range of motion bilateral UE and LE, no joint effusions. Peripheral vascular: Bilateral LE no edema. Neurological: CN II-XII grossly intact with no focal deficits. Alert and oriented to person, place, and time. Coordination, balance, strength, speech and gait are normal.  Skin: Skin is warm and dry. No rash noted. No erythema.  Psychiatric: Patient has a normal mood and affect. Behavior is normal in office today. Judgment and thought content normal in office today.    Assessment & Plan  1. Annual physical exam Declined flu shot. Has missed his appointment with ID Specialist on review of Care Everywhere in [facility] EMR. He states he is having a hard time securing appointment over the phone, my nursing staff will try.  2. Syphilis in male On review of his records at [facility] I am now aware of this diagnosis. Mr. Goodall confirms knowledge of diagnosis and previous treatments, forgot to mention it to me. Clinically stable.   3. Encounter for cholesteral screening for cardiovascular  disease  - Lipid panel  4. Screening for STD (sexually transmitted disease)  - GC/chlamydia probe amp, urine  5. HIV (human immunodeficiency virus infection) Will try and secure appointment with Dr. Delma Post, [facility]. Last labs done 03/2014 reviewed through [facility] EMR. Clinically stable picture.  - CBC with Differential/Platelet - Comprehensive metabolic panel  6. Chronic radicular lumbar pain Reviewed notes from neurosurgical specialist. Encouraged patient to continue following up with his specialists.   7. Encounter for screening for malignant neoplasm of prostate  - PSA  8. Influenza vaccination declined by patient   9. Encounter for screening for malignant neoplasm of colon Hemoccult negative. Due for C-scope.   - Ambulatory referral to General Surgery - POC Hemoccult Bld/Stl (1-Cd Office Dx)  10. Cigarette smoker The patient has been counseled on smoking cessation benefits, goals, strategies and available over the counter and prescription medications that may help them in their efforts.  Options discussed include Nicoderm patches, Wellbutrin and Chantix.  The patient voices understanding their increased risk of cardiovascular and pulmonary diseases with continued use of tobacco products.  - buPROPion (WELLBUTRIN SR) 150 MG 12 hr tablet; Take 1 tablet (150 mg total) by mouth 2 (two) times daily.  Dispense: 60 tablet; Refill: 1

## 2014-11-16 LAB — CBC WITH DIFFERENTIAL/PLATELET
Basophils Absolute: 0 10*3/uL (ref 0.0–0.2)
Basos: 1 %
EOS (ABSOLUTE): 0 10*3/uL (ref 0.0–0.4)
Eos: 1 %
Hematocrit: 45.9 % (ref 37.5–51.0)
Hemoglobin: 15.8 g/dL (ref 12.6–17.7)
Immature Grans (Abs): 0 10*3/uL (ref 0.0–0.1)
Immature Granulocytes: 0 %
Lymphocytes Absolute: 1.6 10*3/uL (ref 0.7–3.1)
Lymphs: 42 %
MCH: 31.7 pg (ref 26.6–33.0)
MCHC: 34.4 g/dL (ref 31.5–35.7)
MCV: 92 fL (ref 79–97)
Monocytes Absolute: 0.1 10*3/uL (ref 0.1–0.9)
Monocytes: 3 %
Neutrophils Absolute: 2 10*3/uL (ref 1.4–7.0)
Neutrophils: 53 %
Platelets: 193 10*3/uL (ref 150–379)
RBC: 4.98 x10E6/uL (ref 4.14–5.80)
RDW: 14.2 % (ref 12.3–15.4)
WBC: 3.8 10*3/uL (ref 3.4–10.8)

## 2014-11-16 LAB — LIPID PANEL
Chol/HDL Ratio: 3.5 ratio units (ref 0.0–5.0)
Cholesterol, Total: 163 mg/dL (ref 100–199)
HDL: 47 mg/dL (ref >39)
LDL Calculated: 103 mg/dL — ABNORMAL HIGH (ref 0–99)
Triglycerides: 67 mg/dL (ref 0–149)
VLDL Cholesterol Cal: 13 mg/dL (ref 5–40)

## 2014-11-16 LAB — COMPREHENSIVE METABOLIC PANEL
ALT: 13 IU/L (ref 0–44)
AST: 22 IU/L (ref 0–40)
Albumin/Globulin Ratio: 1.6 (ref 1.1–2.5)
Albumin: 4.4 g/dL (ref 3.5–5.5)
Alkaline Phosphatase: 102 IU/L (ref 39–117)
BUN/Creatinine Ratio: 9 (ref 9–20)
BUN: 8 mg/dL (ref 6–24)
Bilirubin Total: 0.3 mg/dL (ref 0.0–1.2)
CO2: 26 mmol/L (ref 18–29)
Calcium: 9.7 mg/dL (ref 8.7–10.2)
Chloride: 101 mmol/L (ref 97–108)
Creatinine, Ser: 0.9 mg/dL (ref 0.76–1.27)
GFR calc Af Amer: 110 mL/min/{1.73_m2} (ref >59)
GFR calc non Af Amer: 95 mL/min/{1.73_m2} (ref >59)
Globulin, Total: 2.7 g/dL (ref 1.5–4.5)
Glucose: 112 mg/dL — ABNORMAL HIGH (ref 65–99)
Potassium: 5 mmol/L (ref 3.5–5.2)
Sodium: 139 mmol/L (ref 134–144)
Total Protein: 7.1 g/dL (ref 6.0–8.5)

## 2014-11-16 LAB — PSA: Prostate Specific Ag, Serum: 0.6 ng/mL (ref 0.0–4.0)

## 2014-11-17 ENCOUNTER — Telehealth: Payer: Self-pay

## 2014-11-17 NOTE — Telephone Encounter (Signed)
I tried to contact Dr. Marlaine Hind at [facility] to see if we can get this patient in for a follow-up appointment but they had left for the evening.  A message was left for his staff stating that we we trying to get this patient in for a follow up appointment to either give [place] a call or they could reach out to the patient.

## 2014-11-18 LAB — SPECIMEN STATUS REPORT

## 2014-11-18 LAB — GC/CHLAMYDIA PROBE AMP
Chlamydia trachomatis, NAA: NEGATIVE
Neisseria gonorrhoeae by PCR: NEGATIVE

## 2014-11-20 ENCOUNTER — Telehealth: Payer: Self-pay | Admitting: Family Medicine

## 2014-11-20 NOTE — Telephone Encounter (Signed)
Please call back about patient

## 2014-11-21 ENCOUNTER — Telehealth: Payer: Self-pay

## 2014-11-21 ENCOUNTER — Other Ambulatory Visit: Payer: Self-pay

## 2014-11-21 ENCOUNTER — Telehealth: Payer: Self-pay | Admitting: Family Medicine

## 2014-11-21 NOTE — Telephone Encounter (Signed)
Spoke to patient regarding the message from earlier.

## 2014-11-21 NOTE — Telephone Encounter (Signed)
Patient stated that Dr. Assunta Curtis is wanting his records prior to seeing him b/c he has never seen a patient with an implant before. I informed the patient that the records that he brought [place] may have been sent out to be scanned in but I will fax what we have here to Dr. Dellis Filbert office.

## 2014-11-21 NOTE — Telephone Encounter (Signed)
Requesting return call. States the appointment with Dr Assunta Curtis ([facility]) is needing notes from [facility] Dr Camila Li and you.

## 2014-11-21 NOTE — Telephone Encounter (Signed)
Gastroenterology Pre-Procedure Review  Request Date: 12/01/14 Requesting Physician: Dr. Bobetta Lime  PATIENT REVIEW QUESTIONS: The patient responded to the following health history questions as indicated:    1. Are you having any GI issues? no 2. Do you have a personal history of Polyps? yes (5 years) 3. Do you have a family history of Colon Cancer or Polyps? no 4. Diabetes Mellitus? no 5. Joint replacements in the past 12 months?yes (01/2012) 6. Major health problems in the past 3 months?no 7. Any artificial heart valves, MVP, or defibrillator?no    MEDICATIONS & ALLERGIES:    Patient reports the following regarding taking any anticoagulation/antiplatelet therapy:   Plavix, Coumadin, Eliquis, Xarelto, Lovenox, Pradaxa, Brilinta, or Effient? no Aspirin? no  Patient confirms/reports the following medications:  Current Outpatient Prescriptions  Medication Sig Dispense Refill  . amLODipine (NORVASC) 10 MG tablet Take by mouth.    Marland Kitchen buPROPion (WELLBUTRIN SR) 150 MG 12 hr tablet Take 1 tablet (150 mg total) by mouth 2 (two) times daily. 60 tablet 1  . Cholecalciferol (VITAMIN D3) 50000 UNITS CAPS Take by mouth.    . DULoxetine (CYMBALTA) 60 MG capsule Take by mouth.    . Ergocalciferol 2000 UNITS TABS Take by mouth.    . fluticasone (FLONASE) 50 MCG/ACT nasal spray Place into the nose.    Marland Kitchen Fluticasone Furoate-Vilanterol (BREO ELLIPTA) 100-25 MCG/INH AEPB Inhale 1 puff into the lungs once. 1 each 0  . gabapentin (NEURONTIN) 300 MG capsule Take by mouth.    . Hydrocortisone Acetate 1 % CREA HYDROCORTISONE ACETATE, 1% (External Cream) - Historical Medication  (1 %) Active    . HYDROCORTISONE, TOPICAL, 1 % SOLN Apply 1 application topically as needed.    . meloxicam (MOBIC) 15 MG tablet TAKE 1 TABLET ORALLY ONCE A DAY 30 tablet 4  . mirtazapine (REMERON) 15 MG tablet Take by mouth.    . oxyCODONE-acetaminophen (PERCOCET) 5-325 MG per tablet Take 1 tablet by mouth every 8 (eight) hours  as needed for severe pain. 60 tablet 0  . pravastatin (PRAVACHOL) 20 MG tablet Take by mouth.    . predniSONE (STERAPRED UNI-PAK 21 TAB) 5 MG (21) TBPK tablet Take 1 tablet (5 mg total) by mouth daily. Use as directed in a 6 day taper PredPak 21 tablet 1  . rOPINIRole (REQUIP) 1 MG tablet Take by mouth.     No current facility-administered medications for this visit.    Patient confirms/reports the following allergies:  Allergies  Allergen Reactions  . Fentanyl Shortness Of Breath  . Aspirin Nausea And Vomiting  . Penicillin G Swelling    Other reaction(s): Difficulty breathing    No orders of the defined types were placed in this encounter.    AUTHORIZATION INFORMATION Primary Insurance: 1D#: Group #:  Secondary Insurance: 1D#: Group #:  SCHEDULE INFORMATION: Date: 12/01/14 Time: Location: [place]

## 2014-11-27 ENCOUNTER — Other Ambulatory Visit: Payer: Self-pay | Admitting: Family Medicine

## 2014-11-27 ENCOUNTER — Encounter: Payer: Self-pay | Admitting: *Deleted

## 2014-11-30 NOTE — Discharge Instructions (Signed)

## 2014-12-01 ENCOUNTER — Ambulatory Visit: Payer: Medicare Other | Admitting: Anesthesiology

## 2014-12-01 ENCOUNTER — Encounter: Payer: Self-pay | Admitting: *Deleted

## 2014-12-01 ENCOUNTER — Ambulatory Visit
Admission: RE | Admit: 2014-12-01 | Discharge: 2014-12-01 | Disposition: A | Discharge status: Home / Self Care | Payer: Medicare Other | Source: Ambulatory Visit | Attending: Gastroenterology | Admitting: Gastroenterology

## 2014-12-01 ENCOUNTER — Encounter
Admission: RE | Disposition: A | Discharge status: Home / Self Care | Payer: Self-pay | Source: Ambulatory Visit | Attending: Gastroenterology

## 2014-12-01 ENCOUNTER — Other Ambulatory Visit: Payer: Self-pay | Admitting: Gastroenterology

## 2014-12-01 DIAGNOSIS — Z91048 Other nonmedicinal substance allergy status: Secondary | ICD-10-CM | POA: Insufficient documentation

## 2014-12-01 DIAGNOSIS — Z91018 Allergy to other foods: Secondary | ICD-10-CM | POA: Insufficient documentation

## 2014-12-01 DIAGNOSIS — Z886 Allergy status to analgesic agent status: Secondary | ICD-10-CM | POA: Insufficient documentation

## 2014-12-01 DIAGNOSIS — D124 Benign neoplasm of descending colon: Secondary | ICD-10-CM | POA: Diagnosis not present

## 2014-12-01 DIAGNOSIS — Z21 Asymptomatic human immunodeficiency virus [HIV] infection status: Secondary | ICD-10-CM | POA: Diagnosis not present

## 2014-12-01 DIAGNOSIS — K635 Polyp of colon: Secondary | ICD-10-CM | POA: Insufficient documentation

## 2014-12-01 DIAGNOSIS — E785 Hyperlipidemia, unspecified: Secondary | ICD-10-CM | POA: Diagnosis not present

## 2014-12-01 DIAGNOSIS — K641 Second degree hemorrhoids: Secondary | ICD-10-CM | POA: Insufficient documentation

## 2014-12-01 DIAGNOSIS — G4700 Insomnia, unspecified: Secondary | ICD-10-CM | POA: Insufficient documentation

## 2014-12-01 DIAGNOSIS — M479 Spondylosis, unspecified: Secondary | ICD-10-CM | POA: Diagnosis not present

## 2014-12-01 DIAGNOSIS — Z860100 Personal history of colon polyps, unspecified: Secondary | ICD-10-CM | POA: Insufficient documentation

## 2014-12-01 DIAGNOSIS — Z885 Allergy status to narcotic agent status: Secondary | ICD-10-CM | POA: Diagnosis not present

## 2014-12-01 DIAGNOSIS — Z1211 Encounter for screening for malignant neoplasm of colon: Secondary | ICD-10-CM | POA: Diagnosis present

## 2014-12-01 DIAGNOSIS — L309 Dermatitis, unspecified: Secondary | ICD-10-CM | POA: Insufficient documentation

## 2014-12-01 DIAGNOSIS — I10 Essential (primary) hypertension: Secondary | ICD-10-CM | POA: Insufficient documentation

## 2014-12-01 DIAGNOSIS — Z880 Allergy status to penicillin: Secondary | ICD-10-CM | POA: Diagnosis not present

## 2014-12-01 DIAGNOSIS — D125 Benign neoplasm of sigmoid colon: Secondary | ICD-10-CM | POA: Insufficient documentation

## 2014-12-01 DIAGNOSIS — K5730 Diverticulosis of large intestine without perforation or abscess without bleeding: Secondary | ICD-10-CM | POA: Diagnosis not present

## 2014-12-01 DIAGNOSIS — G2581 Restless legs syndrome: Secondary | ICD-10-CM | POA: Diagnosis not present

## 2014-12-01 DIAGNOSIS — F17210 Nicotine dependence, cigarettes, uncomplicated: Secondary | ICD-10-CM | POA: Insufficient documentation

## 2014-12-01 DIAGNOSIS — Z91013 Allergy to seafood: Secondary | ICD-10-CM | POA: Diagnosis not present

## 2014-12-01 DIAGNOSIS — M160 Bilateral primary osteoarthritis of hip: Secondary | ICD-10-CM | POA: Insufficient documentation

## 2014-12-01 HISTORY — PX: POLYPECTOMY: SHX5525

## 2014-12-01 HISTORY — DX: Dermatitis, unspecified: L30.9

## 2014-12-01 HISTORY — DX: Anesthesia of skin: R20.0

## 2014-12-01 HISTORY — PX: COLONOSCOPY WITH PROPOFOL: SHX5780

## 2014-12-01 HISTORY — DX: Essential (primary) hypertension: I10

## 2014-12-01 SURGERY — COLONOSCOPY WITH PROPOFOL
Anesthesia: Monitor Anesthesia Care | Wound class: Contaminated

## 2014-12-01 MED ORDER — PROPOFOL 10 MG/ML IV BOLUS
INTRAVENOUS | Status: DC | PRN
  Administered 2014-12-01 (×2): 30 mg via INTRAVENOUS
  Administered 2014-12-01: 40 mg via INTRAVENOUS
  Administered 2014-12-01: 50 mg via INTRAVENOUS
  Administered 2014-12-01 (×4): 30 mg via INTRAVENOUS

## 2014-12-01 MED ORDER — ACETAMINOPHEN 325 MG PO TABS: 325.0000 mg | ORAL_TABLET | ORAL | Status: DC | PRN

## 2014-12-01 MED ORDER — STERILE WATER FOR IRRIGATION IR SOLN
Status: DC | PRN
  Administered 2014-12-01: 11:00:00

## 2014-12-01 MED ORDER — LACTATED RINGERS IV SOLN
INTRAVENOUS | Status: DC
  Administered 2014-12-01: 10:00:00 via INTRAVENOUS

## 2014-12-01 MED ORDER — LIDOCAINE HCL (CARDIAC) 20 MG/ML IV SOLN
INTRAVENOUS | Status: DC | PRN
  Administered 2014-12-01: 50 mg via INTRAVENOUS

## 2014-12-01 MED ORDER — ACETAMINOPHEN 160 MG/5ML PO SOLN: 325.0000 mg | ORAL | Status: DC | PRN

## 2014-12-01 SURGICAL SUPPLY — 28 items

## 2014-12-01 NOTE — Transfer of Care (Signed)
Immediate Anesthesia Transfer of Care Note  Patient: Samuel Burnett  Procedure(s) Performed: Procedure(s): COLONOSCOPY WITH PROPOFOL (N/A) POLYPECTOMY  Patient Location: PACU  Anesthesia Type: MAC  Level of Consciousness: awake, alert  and patient cooperative  Airway and Oxygen Therapy: Patient Spontanous Breathing and Patient connected to supplemental oxygen  Post-op Assessment: Post-op Vital signs reviewed, Patient's Cardiovascular Status Stable, Respiratory Function Stable, Patent Airway and No signs of Nausea or vomiting  Post-op Vital Signs: Reviewed and stable  Complications: No apparent anesthesia complications

## 2014-12-01 NOTE — Op Note (Signed)
[facility] Patient Name: Samuel Burnett Procedure Date: 12/01/2014 11:04 AM MRN: [id number] Account #: [id number] Date of Birth: [birth year] Admit Type: Outpatient Age: 54 Room: [facility] OR ROOM 01 Gender: Male Note Status: Finalized Procedure:         Colonoscopy Indications:       High risk colon cancer surveillance: Personal history of                     colonic polyps Providers:         [name], MD Referring MD:      Bobetta Lime (Referring MD) Medicines:         Propofol per Anesthesia Complications:     No immediate complications. Procedure:         Pre-Anesthesia Assessment:                    - Prior to the procedure, a History and Physical was                     performed, and patient medications and allergies were                     reviewed. The patient's tolerance of previous anesthesia                     was also reviewed. The risks and benefits of the procedure                     and the sedation options and risks were discussed with the                     patient. All questions were answered, and informed consent                     was obtained. Prior Anticoagulants: The patient has taken                     no previous anticoagulant or antiplatelet agents. ASA                     Grade Assessment: II - A patient with mild systemic                     disease. After reviewing the risks and benefits, the                     patient was deemed in satisfactory condition to undergo                     the procedure.                    After obtaining informed consent, the colonoscope was                     passed under direct vision. Throughout the procedure, the                     patient's blood pressure, pulse, and oxygen saturations                     were monitored continuously. The [place]  colonoscope (S#: [id number]) was introduced through the anus                     and advanced to the  the cecum, identified by appendiceal                     orifice and ileocecal valve. The colonoscopy was performed                     without difficulty. The patient tolerated the procedure                     well. The quality of the bowel preparation was poor. Findings:      The perianal and digital rectal examinations were normal.      Multiple small-mouthed diverticula were found in the entire colon.      A 5 mm polyp was found in the descending colon. The polyp was sessile.       The polyp was removed with a cold snare. Resection and retrieval were       complete.      A 3 mm polyp was found in the sigmoid colon. The polyp was sessile. The       polyp was removed with a cold biopsy forceps. Resection and retrieval       were complete.      Non-bleeding internal hemorrhoids were found during retroflexion. The       hemorrhoids were Grade II (internal hemorrhoids that prolapse but reduce       spontaneously). Impression:        - Preparation of the colon was poor.                    - Diverticulosis in the entire examined colon.                    - One 5 mm polyp in the descending colon. Resected and                     retrieved.                    - One 3 mm polyp in the sigmoid colon. Resected and                     retrieved.                    - Non-bleeding internal hemorrhoids. Recommendation:    - Await pathology results.                    - Repeat colonoscopy in 5 years for surveillance. Procedure Code(s): --- Professional ---                    [phone], Colonoscopy, flexible; with removal of tumor(s),                     polyp(s), or other lesion(s) by snare technique                    [id number], 63, Colonoscopy, flexible; with biopsy, single or                     multiple Diagnosis Code(s): --- Professional ---  Z86.010, Personal history of colonic polyps                    D12.4, Benign neoplasm of descending colon                    D12.5, Benign  neoplasm of sigmoid colon CPT copyright 2014 American Medical Association. All rights reserved. The codes documented in this report are preliminary and upon coder review may  be revised to meet current compliance requirements. [name], MD 12/01/2014 11:36:04 AM This report has been signed electronically. Number of Addenda: 0 Note Initiated On: 12/01/2014 11:04 AM Scope Withdrawal Time: 0 hours 5 minutes 43 seconds  Total Procedure Duration: 0 hours 11 minutes 50 seconds       [facility]

## 2014-12-01 NOTE — Anesthesia Preprocedure Evaluation (Signed)
Anesthesia Evaluation  Patient identified by MRN, date of birth, ID band Patient awake    Reviewed: Allergy & Precautions, H&P , NPO status , Patient's Chart, lab work & pertinent test results  Airway Mallampati: II  TM Distance: >3 FB Neck ROM: full    Dental no notable dental hx.    Pulmonary Current Smoker,    Pulmonary exam normal        Cardiovascular hypertension, Normal cardiovascular exam     Neuro/Psych Lower extremity numbness, s/p fusion and spinal cord stimulator placement.    GI/Hepatic negative GI ROS, Neg liver ROS,   Endo/Other  negative endocrine ROS  Renal/GU negative Renal ROS  negative genitourinary   Musculoskeletal   Abdominal   Peds  Hematology  (+) HIV,   Anesthesia Other Findings   Reproductive/Obstetrics                             Anesthesia Physical Anesthesia Plan  ASA: II  Anesthesia Plan: MAC   Post-op Pain Management:    Induction: Intravenous  Airway Management Planned: Nasal Cannula  Additional Equipment:   Intra-op Plan:   Post-operative Plan:   Informed Consent: I have reviewed the patients History and Physical, chart, labs and discussed the procedure including the risks, benefits and alternatives for the proposed anesthesia with the patient or authorized representative who has indicated his/her understanding and acceptance.     Plan Discussed with: CRNA  Anesthesia Plan Comments:         Anesthesia Quick Evaluation

## 2014-12-01 NOTE — Anesthesia Postprocedure Evaluation (Signed)
  Anesthesia Post-op Note  Patient: Samuel Burnett  Procedure(s) Performed: Procedure(s): COLONOSCOPY WITH PROPOFOL (N/A) POLYPECTOMY  Anesthesia type:MAC  Patient location: PACU  Post pain: Pain level controlled  Post assessment: Post-op Vital signs reviewed, Patient's Cardiovascular Status Stable, Respiratory Function Stable, Patent Airway and No signs of Nausea or vomiting  Post vital signs: Reviewed and stable  Last Vitals:  Filed Vitals:   12/01/14 1145  BP: 108/89  Pulse: 88  Temp:   Resp: 20    Level of consciousness: awake, alert  and patient cooperative  Complications: No apparent anesthesia complications

## 2014-12-01 NOTE — H&P (Signed)
[facility]  [address], [place], [place] [address] Phone: [phone] Fax : [phone]  Primary Care Physician:  Bobetta Lime, MD Primary Gastroenterologist:  Dr. Allen Norris  Pre-Procedure History & Physical: HPI:  Samuel Burnett is a 54 y.o. male is here for an colonoscopy.   Past Medical History  Diagnosis Date  . Controlled insomnia   . Hyperlipidemia   . Other forms of scoliosis, thoracolumbar region   . HIV infection   . Restless leg syndrome   . Allergy   . Numbness in right leg     outside of right foot, related to medical device implant in spine  . Osteoarthritis of lumbar spine     also- hips  . Hypertension   . Eczema     Past Surgical History  Procedure Laterality Date  . Spine surgery  01/21/12    implanted medtronics SCS  . Eye surgery  age 17    uncross eyes    Prior to Admission medications   Medication Sig Start Date End Date Taking? Authorizing Provider  amLODipine (NORVASC) 10 MG tablet Take by mouth. 05/11/14  Yes Historical Provider, MD  amLODipine (NORVASC) 5 MG tablet TAKE 1 TABLET BY MOUTH DAILY 11/28/14  Yes Bobetta Lime, MD  buPROPion (WELLBUTRIN SR) 150 MG 12 hr tablet Take 1 tablet (150 mg total) by mouth 2 (two) times daily. 11/15/14  Yes Bobetta Lime, MD  DULoxetine (CYMBALTA) 60 MG capsule Take by mouth. 05/11/14  Yes Historical Provider, MD  fluticasone (FLONASE) 50 MCG/ACT nasal spray Place into the nose. 05/11/14  Yes Historical Provider, MD  Fluticasone Furoate-Vilanterol (BREO ELLIPTA) 100-25 MCG/INH AEPB Inhale 1 puff into the lungs once. 09/12/14  Yes Steele Sizer, MD  gabapentin (NEURONTIN) 300 MG capsule Take by mouth. 05/11/14  Yes Historical Provider, MD  Hydrocortisone Acetate 1 % CREA HYDROCORTISONE ACETATE, 1% (External Cream) - Historical Medication  (1 %) Active   Yes Historical Provider, MD  HYDROCORTISONE, TOPICAL, 1 % SOLN Apply 1 application topically as needed. 12/20/10  Yes Historical Provider, MD    meloxicam (MOBIC) 15 MG tablet TAKE 1 TABLET ORALLY ONCE A DAY 09/05/14  Yes Bobetta Lime, MD  mirtazapine (REMERON) 15 MG tablet Take by mouth. 05/11/14  Yes Historical Provider, MD  oxyCODONE-acetaminophen (PERCOCET) 5-325 MG per tablet Take 1 tablet by mouth every 8 (eight) hours as needed for severe pain. 09/21/14  Yes Bobetta Lime, MD  pravastatin (PRAVACHOL) 20 MG tablet Take by mouth. 05/11/14  Yes Historical Provider, MD  rOPINIRole (REQUIP) 1 MG tablet Take by mouth. 06/04/14  Yes Historical Provider, MD  Cholecalciferol (VITAMIN D3) 50000 UNITS CAPS Take by mouth. 05/11/14   Historical Provider, MD  Ergocalciferol 2000 UNITS TABS Take by mouth. 05/12/14   Historical Provider, MD  predniSONE (STERAPRED UNI-PAK 21 TAB) 5 MG (21) TBPK tablet Take 1 tablet (5 mg total) by mouth daily. Use as directed in a 6 day taper PredPak 09/21/14   Bobetta Lime, MD    Allergies as of 11/21/2014 - Review Complete 11/21/2014  Allergen Reaction Noted  . Fentanyl Shortness Of Breath 09/12/2014  . Aspirin Nausea And Vomiting 09/12/2014  . Dust mite extract  11/21/2014  . Penicillin g Swelling 09/12/2014  . Pollen extract  11/21/2014    Family History  Problem Relation Age of Onset  . Lupus Mother   . Cancer Father     Pancreatis  . Diabetes Sister   . Diabetes Brother     Social History  Social History  . Marital Status: Single    Spouse Name: N/A  . Number of Children: N/A  . Years of Education: N/A   Occupational History  . Not on file.   Social History Main Topics  . Smoking status: Current Every Day Smoker -- 0.50 packs/day for 30 years    Types: Cigarettes    Start date: 09/11/1984  . Smokeless tobacco: Never Used  . Alcohol Use: No     Comment: may drink on "special occasions"  . Drug Use: No  . Sexual Activity: No   Other Topics Concern  . Not on file   Social History Narrative    Review of Systems: See HPI, otherwise negative ROS  Physical Exam: BP 121/92 mmHg   Pulse 81  Temp(Src) 97.5 F (36.4 C)  Resp 17  Ht 5\' 9"  (1.753 m)  Wt 153 lb (69.4 kg)  BMI 22.58 kg/m2  SpO2 95% General:   Alert,  pleasant and cooperative in NAD Head:  Normocephalic and atraumatic. Neck:  Supple; no masses or thyromegaly. Lungs:  Clear throughout to auscultation.    Heart:  Regular rate and rhythm. Abdomen:  Soft, nontender and nondistended. Normal bowel sounds, without guarding, and without rebound.   Neurologic:  Alert and  oriented x4;  grossly normal neurologically.  Impression/Plan: Samuel Burnett is here for an colonoscopy to be performed for history of colon polyps.  Risks, benefits, limitations, and alternatives regarding  colonoscopy have been reviewed with the patient.  Questions have been answered.  All parties agreeable.   Ollen Bowl, MD  12/01/2014, 10:29 AM

## 2014-12-01 NOTE — Anesthesia Procedure Notes (Signed)
Procedure Name: MAC Performed by: AMYOT, MICHAEL Pre-anesthesia Checklist: Patient identified, Emergency Drugs available, Suction available, Timeout performed and Patient being monitored Patient Re-evaluated:Patient Re-evaluated prior to inductionOxygen Delivery Method: Nasal cannula Placement Confirmation: positive ETCO2       

## 2014-12-04 ENCOUNTER — Encounter: Payer: Self-pay | Admitting: Gastroenterology

## 2014-12-06 ENCOUNTER — Encounter: Payer: Self-pay | Admitting: Gastroenterology

## 2014-12-07 DIAGNOSIS — D72820 Lymphocytosis (symptomatic): Secondary | ICD-10-CM | POA: Diagnosis not present

## 2014-12-07 DIAGNOSIS — Z21 Asymptomatic human immunodeficiency virus [HIV] infection status: Secondary | ICD-10-CM | POA: Diagnosis not present

## 2014-12-07 DIAGNOSIS — A528 Late syphilis, latent: Secondary | ICD-10-CM | POA: Diagnosis not present

## 2014-12-11 ENCOUNTER — Other Ambulatory Visit: Payer: Self-pay

## 2014-12-11 DIAGNOSIS — G8929 Other chronic pain: Secondary | ICD-10-CM

## 2014-12-11 DIAGNOSIS — M545 Low back pain: Principal | ICD-10-CM

## 2014-12-11 MED ORDER — MELOXICAM 15 MG PO TABS: ORAL_TABLET | ORAL | Status: DC

## 2014-12-11 MED ORDER — ROPINIROLE HCL 1 MG PO TABS: 1.0000 mg | ORAL_TABLET | Freq: Every day | ORAL | Status: DC

## 2014-12-11 NOTE — Telephone Encounter (Signed)
Refill request was sent to Dr. Ashany Sundaram for approval and submission.  

## 2014-12-22 ENCOUNTER — Other Ambulatory Visit: Payer: Self-pay | Admitting: Family Medicine

## 2015-02-14 ENCOUNTER — Other Ambulatory Visit: Payer: Self-pay | Admitting: Family Medicine

## 2015-02-14 ENCOUNTER — Ambulatory Visit: Payer: Medicare Other | Admitting: Family Medicine

## 2015-02-14 DIAGNOSIS — M4726 Other spondylosis with radiculopathy, lumbar region: Secondary | ICD-10-CM

## 2015-02-14 MED ORDER — OXYCODONE-ACETAMINOPHEN 5-325 MG PO TABS: 1.0000 | ORAL_TABLET | Freq: Three times a day (TID) | ORAL | Status: DC | PRN

## 2015-02-15 DIAGNOSIS — M19041 Primary osteoarthritis, right hand: Secondary | ICD-10-CM | POA: Diagnosis not present

## 2015-02-15 DIAGNOSIS — M19042 Primary osteoarthritis, left hand: Secondary | ICD-10-CM | POA: Diagnosis not present

## 2015-02-15 DIAGNOSIS — M545 Low back pain: Secondary | ICD-10-CM | POA: Diagnosis not present

## 2015-04-10 ENCOUNTER — Other Ambulatory Visit: Payer: Self-pay | Admitting: Family Medicine

## 2015-04-15 ENCOUNTER — Other Ambulatory Visit: Payer: Self-pay | Admitting: Family Medicine

## 2015-04-24 ENCOUNTER — Encounter: Payer: Self-pay | Admitting: Family Medicine

## 2015-04-24 ENCOUNTER — Ambulatory Visit (INDEPENDENT_AMBULATORY_CARE_PROVIDER_SITE_OTHER): Payer: Medicare Other | Admitting: Family Medicine

## 2015-04-24 VITALS — BP 126/82 | HR 78 | Temp 98.0°F | Resp 18 | Wt 167.5 lb

## 2015-04-24 DIAGNOSIS — Z6741 Type O blood, Rh negative: Secondary | ICD-10-CM

## 2015-04-24 DIAGNOSIS — J40 Bronchitis, not specified as acute or chronic: Secondary | ICD-10-CM

## 2015-04-24 DIAGNOSIS — Z21 Asymptomatic human immunodeficiency virus [HIV] infection status: Secondary | ICD-10-CM | POA: Diagnosis not present

## 2015-04-24 DIAGNOSIS — R05 Cough: Secondary | ICD-10-CM

## 2015-04-24 DIAGNOSIS — R059 Cough, unspecified: Secondary | ICD-10-CM

## 2015-04-24 DIAGNOSIS — Z720 Tobacco use: Secondary | ICD-10-CM | POA: Diagnosis not present

## 2015-04-24 DIAGNOSIS — B20 Human immunodeficiency virus [HIV] disease: Secondary | ICD-10-CM

## 2015-04-24 DIAGNOSIS — F17210 Nicotine dependence, cigarettes, uncomplicated: Secondary | ICD-10-CM

## 2015-04-24 HISTORY — DX: Type O blood, Rh negative: Z67.41

## 2015-04-24 MED ORDER — SULFAMETHOXAZOLE-TRIMETHOPRIM 800-160 MG PO TABS: 1.0000 | ORAL_TABLET | Freq: Two times a day (BID) | ORAL | Status: DC

## 2015-04-24 NOTE — Progress Notes (Signed)
Name: Samuel Burnett   MRN: [id number]    DOB: 1960-03-17   Date:04/24/2015       Progress Note  Subjective  Chief Complaint  Chief Complaint  Patient presents with  . Bronchitis    patient presents with chest congestion for about a week. patient has taking lots of otc medication.    HPI  URI: he states he has been sick for the past week. Started with rhinorrhea, nasal congestion, facial pressure and post-nasal drainage and now symptoms have gone to his chest and has a productive cough, and mild SOB with activity. He is HIV positive , not on anti-retrovirals . Diagnosed 20 years ago - on a study for O negative blood type. Last viral load was in 11/2014 . See ID clinic at [facility]. He denies fever, chills, change in appetite or weight loss. No rashes   Patient Active Problem List   Diagnosis Date Noted  . Type o blood, rh negative 04/24/2015  . Hx of colonic polyps   . Benign neoplasm of descending colon   . Benign neoplasm of sigmoid colon   . History of syphilis 11/15/2014  . Influenza vaccination declined by patient 11/15/2014  . Cigarette smoker 11/15/2014  . Allergic rhinitis 09/12/2014  . Cannot sleep 09/12/2014  . Anxiety and depression 09/12/2014  . ED (erectile dysfunction) of organic origin 09/12/2014  . HIV (human immunodeficiency virus infection) ([place]) 09/12/2014  . HLD (hyperlipidemia) 09/12/2014  . Hypertension goal BP (blood pressure) < 140/90 09/12/2014  . Degenerative arthritis of lumbar spine 09/12/2014  . Restless leg 09/12/2014  . Current tobacco use 09/12/2014  . Vitamin D deficiency 09/12/2014  . Scoliosis 09/12/2014  . AD (atopic dermatitis) 04/06/2014  . History of concussion 04/19/2013  . Chronic radicular lumbar pain 09/09/2012    Past Surgical History  Procedure Laterality Date  . Spine surgery  01/21/12    implanted medtronics SCS  . Eye surgery  age 55    uncross eyes  . Colonoscopy with propofol N/A 12/01/2014    Procedure: COLONOSCOPY WITH  PROPOFOL;  Surgeon: [name], MD;  [facility];  Service: Endoscopy;  Laterality: N/A;  . Polypectomy  12/01/2014    Procedure: POLYPECTOMY;  Surgeon: [name], MD;  Location: [place];  Service: Endoscopy;;    Family History  Problem Relation Age of Onset  . Lupus Mother   . Cancer Father     Pancreatis  . Diabetes Sister   . Diabetes Brother     Social History   Social History  . Marital Status: Single    Spouse Name: N/A  . Number of Children: N/A  . Years of Education: N/A   Occupational History  . Not on file.   Social History Main Topics  . Smoking status: Current Every Day Smoker -- 0.50 packs/day for 30 years    Types: Cigarettes    Start date: 09/11/1984  . Smokeless tobacco: Never Used  . Alcohol Use: No     Comment: may drink on "special occasions"  . Drug Use: No  . Sexual Activity: No   Other Topics Concern  . Not on file   Social History Narrative     Current outpatient prescriptions:  .  ibuprofen (ADVIL,MOTRIN) 800 MG tablet, , Disp: , Rfl:  .  sildenafil (VIAGRA) 50 MG tablet, , Disp: , Rfl:  .  amLODipine (NORVASC) 5 MG tablet, TAKE 1 TABLET BY MOUTH DAILY, Disp: 90 tablet, Rfl: 3 .  buPROPion [facility]  SR) 150 MG 12 hr tablet, TAKE 1 TABLET (150 MG TOTAL) BY MOUTH 2 (TWO) TIMES DAILY., Disp: 60 tablet, Rfl: 3 .  Cholecalciferol (VITAMIN D3) 50000 UNITS CAPS, Take by mouth., Disp: , Rfl:  .  DULoxetine (CYMBALTA) 60 MG capsule, Take 1 capsule by mouth  daily, Disp: 90 capsule, Rfl: 1 .  Ergocalciferol 2000 UNITS TABS, Take by mouth., Disp: , Rfl:  .  fluticasone (FLONASE) 50 MCG/ACT nasal spray, Place into the nose., Disp: , Rfl:  .  Fluticasone Furoate-Vilanterol (BREO ELLIPTA) 100-25 MCG/INH AEPB, Inhale 1 puff into the lungs once., Disp: 1 each, Rfl: 0 .  gabapentin (NEURONTIN) 300 MG capsule, Take 1 capsule by mouth 4  times daily, Disp: 360 capsule, Rfl: 1 .  Hydrocortisone Acetate 1 % CREA, HYDROCORTISONE  ACETATE, 1% (External Cream) - Historical Medication  (1 %) Active, Disp: , Rfl:  .  meloxicam (MOBIC) 15 MG tablet, TAKE 1 TABLET ORALLY ONCE A DAY, Disp: 90 tablet, Rfl: 3 .  mirtazapine (REMERON) 15 MG tablet, Take by mouth., Disp: , Rfl:  .  oxyCODONE-acetaminophen (PERCOCET) 5-325 MG tablet, Take 1 tablet by mouth every 8 (eight) hours as needed for severe pain., Disp: 30 tablet, Rfl: 0 .  pravastatin (PRAVACHOL) 20 MG tablet, TAKE ONE TABLET BY MOUTH AT BEDTIME, Disp: 30 tablet, Rfl: 6 .  rOPINIRole (REQUIP) 1 MG tablet, Take 1 tablet (1 mg total) by mouth at bedtime., Disp: 90 tablet, Rfl: 3 .  sulfamethoxazole-trimethoprim (BACTRIM DS,SEPTRA DS) 800-160 MG tablet, Take 1 tablet by mouth 2 (two) times daily., Disp: 20 tablet, Rfl: 0  Allergies  Allergen Reactions  . Fentanyl Shortness Of Breath  . Shellfish Allergy Other (See Comments)    Angioedema  . Tomato Other (See Comments)    Angioedema  . Aspirin Nausea And Vomiting  . Dust Mite Extract   . Penicillin G Swelling    Other reaction(s): Difficulty breathing  . Pollen Extract      ROS  Ten systems reviewed and is negative except as mentioned in HPI  Objective  Filed Vitals:   04/24/15 1135  BP: 126/82  Pulse: 78  Temp: 98 F (36.7 C)  TempSrc: Oral  Resp: 18  Weight: 167 lb 8 oz (75.978 kg)  SpO2: 95%    Body mass index is 24.72 kg/(m^2).  Physical Exam  Constitutional: Patient appears well-developed and thin. No distress.  HEENT: head atraumatic, normocephalic, pupils equal and reactive to light, ears TM normal bilaterally  neck supple, throat within normal limits Cardiovascular: Normal rate, regular rhythm and normal heart sounds.  No murmur heard. No BLE edema. Pulmonary/Chest: Effort normal bilateral rhonchi, no wheezing or crackles Abdominal: Soft.  There is no tenderness. Psychiatric: Patient has a normal mood and affect. behavior is normal. Judgment and thought content normal.  PHQ2/9: Depression  screen [facility] 2/9 04/24/2015 11/15/2014 09/12/2014  Decreased Interest 0 0 0  Down, Depressed, Hopeless 0 0 0  PHQ - 2 Score 0 0 0     Fall Risk: Fall Risk  04/24/2015 11/15/2014 09/12/2014  Falls in the past year? No No No    Functional Status Survey: Is the patient deaf or have difficulty hearing?: No Does the patient have difficulty seeing, even when wearing glasses/contacts?: No Does the patient have difficulty concentrating, remembering, or making decisions?: No Does the patient have difficulty walking or climbing stairs?: No Does the patient have difficulty dressing or bathing?: No Does the patient have difficulty doing errands alone such as  visiting a doctor's office or shopping?: No   Assessment & Plan  1. Bronchitis  - sulfamethoxazole-trimethoprim (BACTRIM DS,SEPTRA DS) 800-160 MG tablet; Take 1 tablet by mouth 2 (two) times daily.  Dispense: 20 tablet; Refill: 0 Drinks fluids, rest and resume Breo that he has at home, can take otc medication for cough  2. HIV (human immunodeficiency virus infection) ([place])  Continue follow up with ID  3. Cigarette smoker  - DG Chest 2 View; Future  4. Cough  - DG Chest 2 View; Future

## 2015-05-01 ENCOUNTER — Encounter: Payer: Self-pay | Admitting: Family Medicine

## 2015-05-01 ENCOUNTER — Ambulatory Visit (INDEPENDENT_AMBULATORY_CARE_PROVIDER_SITE_OTHER): Payer: Medicare Other | Admitting: Family Medicine

## 2015-05-01 VITALS — BP 118/72 | HR 94 | Temp 97.7°F | Resp 20 | Ht 69.0 in | Wt 161.5 lb

## 2015-05-01 DIAGNOSIS — G8929 Other chronic pain: Secondary | ICD-10-CM | POA: Diagnosis not present

## 2015-05-01 DIAGNOSIS — I10 Essential (primary) hypertension: Secondary | ICD-10-CM | POA: Diagnosis not present

## 2015-05-01 DIAGNOSIS — Z79899 Other long term (current) drug therapy: Secondary | ICD-10-CM | POA: Diagnosis not present

## 2015-05-01 DIAGNOSIS — M545 Low back pain: Secondary | ICD-10-CM | POA: Diagnosis not present

## 2015-05-01 MED ORDER — OXYCODONE-ACETAMINOPHEN 5-325 MG PO TABS: 1.0000 | ORAL_TABLET | Freq: Three times a day (TID) | ORAL | Status: DC | PRN

## 2015-05-01 NOTE — Progress Notes (Signed)
Name: Samuel Burnett   MRN: [id number]    DOB: June 18, 1960   Date:05/01/2015       Progress Note  Subjective  Chief Complaint  Chief Complaint  Patient presents with  . Pain    pt here for 1 week follow up for pain management    HPI  Samuel Burnett is a 55 year old male with HIV, Syphilis, Chronic back pain. Patient complains of pain in lower back for which has been present for several years. Pain is located in lumbar spine, is described as aching, boring and throbbing, and is constant, moderate . Associated symptoms include: decreased range of motion. The patient has tried cold, heat, NSAIDs, position change, rest, invasive therapies, implanted stimulator and opioid medication for pain, with moderate relief. Related to injury: not applicable. Has consulted with spine specialist, Dr. Brien Few.   To clarify, Samuel Burnett is not on chronic pain management with me although he requests opioid medication RX often. I last prescribed Oxycodone-Aceto 5-325 mg quantity #30 on 02/14/15.  He recently was seen in the office by a colleague for acute respiratory symptoms and reports these are improving.   Patient presents for follow-up of hypertension. It has been present for several years.  Patient states that there is compliance with medical regimen with no side effects.  End organ disease include nonet. Cardiac risk factors include HIV, hypertension, hyperlipidemia.  Exercise regimen consist of none.  Diet consist of low salt diet.  Past Medical History  Diagnosis Date  . Controlled insomnia   . Hyperlipidemia   . Other forms of scoliosis, thoracolumbar region   . HIV infection ([place])   . Restless leg syndrome   . Allergy   . Numbness in right leg     outside of right foot, related to medical device implant in spine  . Osteoarthritis of lumbar spine     also- hips  . Hypertension   . Eczema     Patient Active Problem List   Diagnosis Date Noted  . Type o blood, rh negative 04/24/2015  . Hx of  colonic polyps   . Benign neoplasm of descending colon   . Benign neoplasm of sigmoid colon   . History of syphilis 11/15/2014  . Influenza vaccination declined by patient 11/15/2014  . Cigarette smoker 11/15/2014  . Allergic rhinitis 09/12/2014  . Cannot sleep 09/12/2014  . Anxiety and depression 09/12/2014  . ED (erectile dysfunction) of organic origin 09/12/2014  . HIV (human immunodeficiency virus infection) ([place]) 09/12/2014  . HLD (hyperlipidemia) 09/12/2014  . Hypertension goal BP (blood pressure) < 140/90 09/12/2014  . Degenerative arthritis of lumbar spine 09/12/2014  . Restless leg 09/12/2014  . Current tobacco use 09/12/2014  . Vitamin D deficiency 09/12/2014  . Scoliosis 09/12/2014  . AD (atopic dermatitis) 04/06/2014  . History of concussion 04/19/2013  . Chronic radicular lumbar pain 09/09/2012    Social History  Substance Use Topics  . Smoking status: Current Every Day Smoker -- 0.50 packs/day for 30 years    Types: Cigarettes    Start date: 09/11/1984  . Smokeless tobacco: Never Used  . Alcohol Use: No     Comment: may drink on "special occasions"     Current outpatient prescriptions:  .  amLODipine (NORVASC) 5 MG tablet, TAKE 1 TABLET BY MOUTH DAILY, Disp: 90 tablet, Rfl: 3 .  buPROPion (WELLBUTRIN SR) 150 MG 12 hr tablet, TAKE 1 TABLET (150 MG TOTAL) BY MOUTH 2 (TWO) TIMES DAILY., Disp: 60 tablet, Rfl: 3 .  Cholecalciferol (VITAMIN D3) 50000 UNITS CAPS, Take by mouth., Disp: , Rfl:  .  DULoxetine (CYMBALTA) 60 MG capsule, Take 1 capsule by mouth  daily, Disp: 90 capsule, Rfl: 1 .  Ergocalciferol 2000 UNITS TABS, Take by mouth., Disp: , Rfl:  .  fluticasone (FLONASE) 50 MCG/ACT nasal spray, Place into the nose., Disp: , Rfl:  .  Fluticasone Furoate-Vilanterol (BREO ELLIPTA) 100-25 MCG/INH AEPB, Inhale 1 puff into the lungs once., Disp: 1 each, Rfl: 0 .  gabapentin (NEURONTIN) 300 MG capsule, Take 1 capsule by mouth 4  times daily, Disp: 360 capsule, Rfl: 1 .   Hydrocortisone Acetate 1 % CREA, HYDROCORTISONE ACETATE, 1% (External Cream) - Historical Medication  (1 %) Active, Disp: , Rfl:  .  ibuprofen (ADVIL,MOTRIN) 800 MG tablet, , Disp: , Rfl:  .  meloxicam (MOBIC) 15 MG tablet, TAKE 1 TABLET ORALLY ONCE A DAY, Disp: 90 tablet, Rfl: 3 .  mirtazapine (REMERON) 15 MG tablet, Take by mouth., Disp: , Rfl:  .  oxyCODONE-acetaminophen (PERCOCET) 5-325 MG tablet, Take 1 tablet by mouth every 8 (eight) hours as needed for severe pain., Disp: 30 tablet, Rfl: 0 .  pravastatin (PRAVACHOL) 20 MG tablet, TAKE ONE TABLET BY MOUTH AT BEDTIME, Disp: 30 tablet, Rfl: 6 .  rOPINIRole (REQUIP) 1 MG tablet, Take 1 tablet (1 mg total) by mouth at bedtime., Disp: 90 tablet, Rfl: 3 .  sildenafil (VIAGRA) 50 MG tablet, , Disp: , Rfl:  .  sulfamethoxazole-trimethoprim (BACTRIM DS,SEPTRA DS) 800-160 MG tablet, Take 1 tablet by mouth 2 (two) times daily., Disp: 20 tablet, Rfl: 0  Past Surgical History  Procedure Laterality Date  . Spine surgery  01/21/12    implanted medtronics SCS  . Eye surgery  age 62    uncross eyes  . Colonoscopy with propofol N/A 12/01/2014    Procedure: COLONOSCOPY WITH PROPOFOL;  Surgeon: [name], MD;  Location: [facility];  Service: Endoscopy;  Laterality: N/A;  . Polypectomy  12/01/2014    Procedure: POLYPECTOMY;  Surgeon: [name], MD;  Location: [facility];  Service: Endoscopy;;    Family History  Problem Relation Age of Onset  . Lupus Mother   . Cancer Father     Pancreatis  . Diabetes Sister   . Diabetes Brother     Allergies  Allergen Reactions  . Fentanyl Shortness Of Breath  . Shellfish Allergy Other (See Comments)    Angioedema  . Tomato Other (See Comments)    Angioedema  . Aspirin Nausea And Vomiting  . Dust Mite Extract   . Penicillin G Swelling    Other reaction(s): Difficulty breathing  . Pollen Extract      Review of Systems  CONSTITUTIONAL: No significant weight changes, fever, chills,  weakness or fatigue.  HEENT:  - Eyes: No visual changes.  - Ears: No auditory changes. No pain.  - Nose: No sneezing, congestion, runny nose. - Throat: No sore throat. No changes in swallowing. SKIN: No rash or itching.  CARDIOVASCULAR: No chest pain, chest pressure or chest discomfort. No palpitations or edema.  RESPIRATORY: No shortness of breath, cough or sputum.  GASTROINTESTINAL: No anorexia, nausea, vomiting. No changes in bowel habits. No abdominal pain or blood.  GENITOURINARY: No dysuria. No frequency. No discharge. NEUROLOGICAL: No headache, dizziness, syncope, paralysis, ataxia, numbness or tingling in the extremities. No memory changes. No change in bowel or bladder control.  MUSCULOSKELETAL: Chronic back joint pain. No muscle pain. HEMATOLOGIC: No anemia, bleeding or bruising.  LYMPHATICS: No enlarged lymph nodes.  PSYCHIATRIC: No change in mood. No change in sleep pattern.  ENDOCRINOLOGIC: No reports of sweating, cold or heat intolerance. No polyuria or polydipsia.     Objective  BP 118/72 mmHg  Pulse 94  Temp(Src) 97.7 F (36.5 C)  Resp 20  Ht 5\' 9"  (1.753 m)  Wt 161 lb 8 oz (73.256 kg)  BMI 23.84 kg/m2  SpO2 96% Body mass index is 23.84 kg/(m^2).  Physical Exam  Constitutional: Patient appears well-developed and well-nourished. In no distress.  HEENT:  - Head: Normocephalic and atraumatic.  - Ears: Bilateral TMs gray, no erythema or effusion - Nose: Nasal mucosa moist - Mouth/Throat: Oropharynx is clear and moist. No tonsillar hypertrophy or erythema. No post nasal drainage.  - Eyes: Conjunctivae clear, EOM movements normal. PERRLA. No scleral icterus.  Neck: Normal range of motion. Neck supple. No JVD present. No thyromegaly present.  Cardiovascular: Normal rate, regular rhythm and normal heart sounds.  No murmur heard.  Pulmonary/Chest: Effort normal and breath sounds normal. No respiratory distress. Musculoskeletal: Normal range of motion bilateral UE  and LE, no joint effusions. Lumbar spine with no palpable step off or point tenderness over spinal column. Skin: Skin is warm and dry. No rash noted. Psychiatric: Patient has a normal mood and affect. Behavior is normal in office today. Judgment and thought content normal in office today.   Assessment & Plan  1. Chronic lumbar pain Samuel Burnett is knowledgeable regarding being mindful of his pain, utilizing his implanted stimulator and pain medication sparingly. 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 Because I am leaving the practice in a few weeks I reminded him that he should only use opioid medication for severe pain and he will have to discuss pain management with a new PCP. I have checked the [place] and there is no irregular prescription history. UDS obtained today.  - Drug Screen, Urine - oxyCODONE-acetaminophen (PERCOCET) 5-325 MG tablet; Take 1 tablet by mouth every 8 (eight) hours as needed for severe pain.  Dispense: 30 tablet; Refill: 0  2. Hypertension goal BP (blood pressure) < 140/90 Well controled on current regimen. Continue.

## 2015-05-02 LAB — DRUG SCREEN, URINE
Amphetamines, Urine: NEGATIVE ng/mL
Barbiturate screen, urine: NEGATIVE ng/mL
Benzodiazepine Quant, Ur: NEGATIVE ng/mL
Cannabinoid Quant, Ur: POSITIVE ng/mL
Cocaine (Metab.): NEGATIVE ng/mL
Opiate Quant, Ur: NEGATIVE ng/mL
PCP Quant, Ur: NEGATIVE ng/mL

## 2015-05-16 ENCOUNTER — Ambulatory Visit: Payer: Medicare Other | Admitting: Family Medicine

## 2015-10-30 ENCOUNTER — Other Ambulatory Visit: Payer: Self-pay

## 2015-10-30 MED ORDER — DULOXETINE HCL 60 MG PO CPEP: 60.0000 mg | ORAL_CAPSULE | Freq: Every day | ORAL | 0 refills | Status: DC

## 2015-10-30 MED ORDER — GABAPENTIN 300 MG PO CAPS: 300.0000 mg | ORAL_CAPSULE | Freq: Four times a day (QID) | ORAL | 0 refills | Status: DC

## 2015-10-30 NOTE — Telephone Encounter (Signed)
Last office note reviewed; Rxs approved

## 2015-11-19 ENCOUNTER — Ambulatory Visit (INDEPENDENT_AMBULATORY_CARE_PROVIDER_SITE_OTHER): Payer: Medicare Other | Admitting: Family Medicine

## 2015-11-19 ENCOUNTER — Encounter: Payer: Self-pay | Admitting: Family Medicine

## 2015-11-19 VITALS — BP 124/80 | HR 74 | Temp 98.4°F | Wt 157.5 lb

## 2015-11-19 DIAGNOSIS — F32A Depression, unspecified: Secondary | ICD-10-CM

## 2015-11-19 DIAGNOSIS — F418 Other specified anxiety disorders: Secondary | ICD-10-CM

## 2015-11-19 DIAGNOSIS — J309 Allergic rhinitis, unspecified: Secondary | ICD-10-CM

## 2015-11-19 DIAGNOSIS — G2581 Restless legs syndrome: Secondary | ICD-10-CM

## 2015-11-19 DIAGNOSIS — F329 Major depressive disorder, single episode, unspecified: Secondary | ICD-10-CM

## 2015-11-19 DIAGNOSIS — R634 Abnormal weight loss: Secondary | ICD-10-CM

## 2015-11-19 DIAGNOSIS — L719 Rosacea, unspecified: Secondary | ICD-10-CM | POA: Diagnosis not present

## 2015-11-19 DIAGNOSIS — Z5181 Encounter for therapeutic drug level monitoring: Secondary | ICD-10-CM

## 2015-11-19 DIAGNOSIS — Z21 Asymptomatic human immunodeficiency virus [HIV] infection status: Secondary | ICD-10-CM

## 2015-11-19 DIAGNOSIS — E785 Hyperlipidemia, unspecified: Secondary | ICD-10-CM | POA: Diagnosis not present

## 2015-11-19 DIAGNOSIS — B20 Human immunodeficiency virus [HIV] disease: Secondary | ICD-10-CM

## 2015-11-19 DIAGNOSIS — Z720 Tobacco use: Secondary | ICD-10-CM

## 2015-11-19 DIAGNOSIS — I10 Essential (primary) hypertension: Secondary | ICD-10-CM

## 2015-11-19 DIAGNOSIS — M4726 Other spondylosis with radiculopathy, lumbar region: Secondary | ICD-10-CM

## 2015-11-19 DIAGNOSIS — E559 Vitamin D deficiency, unspecified: Secondary | ICD-10-CM

## 2015-11-19 DIAGNOSIS — F419 Anxiety disorder, unspecified: Secondary | ICD-10-CM

## 2015-11-19 HISTORY — DX: Encounter for therapeutic drug level monitoring: Z51.81

## 2015-11-19 HISTORY — DX: Abnormal weight loss: R63.4

## 2015-11-19 LAB — COMPLETE METABOLIC PANEL WITH GFR
ALT: 16 U/L (ref 9–46)
AST: 25 U/L (ref 10–35)
Albumin: 4.3 g/dL (ref 3.6–5.1)
Alkaline Phosphatase: 89 U/L (ref 40–115)
BUN: 12 mg/dL (ref 7–25)
CO2: 25 mmol/L (ref 20–31)
Calcium: 9.6 mg/dL (ref 8.6–10.3)
Chloride: 103 mmol/L (ref 98–110)
Creat: 1.1 mg/dL (ref 0.70–1.33)
GFR, Est African American: 86 mL/min (ref >=60)
GFR, Est Non African American: 74 mL/min (ref >=60)
Glucose, Bld: 95 mg/dL (ref 65–99)
Potassium: 4.9 mmol/L (ref 3.5–5.3)
Sodium: 138 mmol/L (ref 135–146)
Total Bilirubin: 0.6 mg/dL (ref 0.2–1.2)
Total Protein: 7.5 g/dL (ref 6.1–8.1)

## 2015-11-19 LAB — LIPID PANEL
Cholesterol: 267 mg/dL — ABNORMAL HIGH (ref 125–200)
HDL: 50 mg/dL (ref >=40)
LDL Cholesterol: 192 mg/dL — ABNORMAL HIGH (ref <130)
Total CHOL/HDL Ratio: 5.3 Ratio — ABNORMAL HIGH (ref <=5.0)
Triglycerides: 125 mg/dL (ref <150)
VLDL: 25 mg/dL (ref <30)

## 2015-11-19 MED ORDER — MIRTAZAPINE 30 MG PO TABS: 30.0000 mg | ORAL_TABLET | Freq: Every day | ORAL | 5 refills | Status: DC

## 2015-11-19 MED ORDER — PRAVASTATIN SODIUM 20 MG PO TABS: 20.0000 mg | ORAL_TABLET | Freq: Every day | ORAL | 5 refills | Status: DC

## 2015-11-19 MED ORDER — DULOXETINE HCL 60 MG PO CPEP: 60.0000 mg | ORAL_CAPSULE | Freq: Every day | ORAL | 1 refills | Status: DC

## 2015-11-19 MED ORDER — METRONIDAZOLE 1 % EX GEL: Freq: Every day | CUTANEOUS | 5 refills | Status: DC

## 2015-11-19 MED ORDER — GABAPENTIN 400 MG PO CAPS: 400.0000 mg | ORAL_CAPSULE | Freq: Three times a day (TID) | ORAL | 1 refills | Status: DC

## 2015-11-19 NOTE — Patient Instructions (Addendum)
Your goal blood pressure is less than 140 mmHg on top. Try to follow the DASH guidelines (DASH stands for Dietary Approaches to Stop Hypertension) Try to limit the sodium in your diet.  Ideally, consume less than 1.5 grams (less than 1,500mg ) per day. Do not add salt when cooking or at the table.  Check the sodium amount on labels when shopping, and choose items lower in sodium when given a choice. Avoid or limit foods that already contain a lot of sodium. Eat a diet rich in fruits and vegetables and whole grains.  Increase mirtazipine to 30 mg at bedtime When you finish the current bottle of gabapentin, start the 400 mg and take just three times a day  I do encourage you to quit smoking Call [phone] to sign up for smoking cessation classes You can call 1-800-QUIT-NOW to talk with a smoking cessation coach  Let's get labs today and bring you back for physical  DASH Eating Plan DASH stands for "Dietary Approaches to Stop Hypertension." The DASH eating plan is a healthy eating plan that has been shown to reduce high blood pressure (hypertension). Additional health benefits may include reducing the risk of type 2 diabetes mellitus, heart disease, and stroke. The DASH eating plan may also help with weight loss. WHAT DO I NEED TO KNOW ABOUT THE DASH EATING PLAN? For the DASH eating plan, you will follow these general guidelines:  Choose foods with a percent daily value for sodium of less than 5% (as listed on the food label).  Use salt-free seasonings or herbs instead of table salt or sea salt.  Check with your health care provider or pharmacist before using salt substitutes.  Eat lower-sodium products, often labeled as "lower sodium" or "no salt added."  Eat fresh foods.  Eat more vegetables, fruits, and low-fat dairy products.  Choose whole grains. Look for the word "whole" as the first word in the ingredient list.  Choose fish and skinless chicken or [place] more often than red  meat. Limit fish, poultry, and meat to 6 oz (170 g) each day.  Limit sweets, desserts, sugars, and sugary drinks.  Choose heart-healthy fats.  Limit cheese to 1 oz (28 g) per day.  Eat more home-cooked food and less restaurant, buffet, and fast food.  Limit fried foods.  Cook foods using methods other than frying.  Limit canned vegetables. If you do use them, rinse them well to decrease the sodium.  When eating at a restaurant, ask that your food be prepared with less salt, or no salt if possible. WHAT FOODS CAN I EAT? Seek help from a dietitian for individual calorie needs. Grains Whole grain or whole wheat bread. Brown rice. Whole grain or whole wheat pasta. Quinoa, bulgur, and whole grain cereals. Low-sodium cereals. Corn or whole wheat flour tortillas. Whole grain cornbread. Whole grain crackers. Low-sodium crackers. Vegetables Fresh or frozen vegetables (raw, steamed, roasted, or grilled). Low-sodium or reduced-sodium tomato and vegetable juices. Low-sodium or reduced-sodium tomato sauce and paste. Low-sodium or reduced-sodium canned vegetables.  Fruits All fresh, canned (in natural juice), or frozen fruits. Meat and Other Protein Products Ground beef (85% or leaner), grass-fed beef, or beef trimmed of fat. Skinless chicken or [place]. Ground chicken or [place]. Pork trimmed of fat. All fish and seafood. Eggs. Dried beans, peas, or lentils. Unsalted nuts and seeds. Unsalted canned beans. Dairy Low-fat dairy products, such as skim or 1% milk, 2% or reduced-fat cheeses, low-fat ricotta or cottage cheese, or plain low-fat yogurt. Low-sodium  or reduced-sodium cheeses. Fats and Oils Tub margarines without trans fats. Light or reduced-fat mayonnaise and salad dressings (reduced sodium). Avocado. Safflower, olive, or canola oils. Natural peanut or almond butter. Other Unsalted popcorn and pretzels. The items listed above may not be a complete list of recommended foods or beverages.  Contact your dietitian for more options. WHAT FOODS ARE NOT RECOMMENDED? Grains White bread. White pasta. White rice. Refined cornbread. Bagels and croissants. Crackers that contain trans fat. Vegetables Creamed or fried vegetables. Vegetables in a cheese sauce. Regular canned vegetables. Regular canned tomato sauce and paste. Regular tomato and vegetable juices. Fruits Dried fruits. Canned fruit in light or heavy syrup. Fruit juice. Meat and Other Protein Products Fatty cuts of meat. Ribs, chicken wings, bacon, sausage, bologna, salami, chitterlings, fatback, hot dogs, bratwurst, and packaged luncheon meats. Salted nuts and seeds. Canned beans with salt. Dairy Whole or 2% milk, cream, half-and-half, and cream cheese. Whole-fat or sweetened yogurt. Full-fat cheeses or blue cheese. Nondairy creamers and whipped toppings. Processed cheese, cheese spreads, or cheese curds. Condiments Onion and garlic salt, seasoned salt, table salt, and sea salt. Canned and packaged gravies. Worcestershire sauce. Tartar sauce. Barbecue sauce. Teriyaki sauce. Soy sauce, including reduced sodium. Steak sauce. Fish sauce. Oyster sauce. Cocktail sauce. Horseradish. Ketchup and mustard. Meat flavorings and tenderizers. Bouillon cubes. Hot sauce. Tabasco sauce. Marinades. Taco seasonings. Relishes. Fats and Oils Butter, stick margarine, lard, shortening, ghee, and bacon fat. Coconut, palm kernel, or palm oils. Regular salad dressings. Other Pickles and olives. Salted popcorn and pretzels. The items listed above may not be a complete list of foods and beverages to avoid. Contact your dietitian for more information. WHERE CAN I FIND MORE INFORMATION? National Heart, Lung, and Blood Institute: travelstabloid.com   This information is not intended to replace advice given to you by your health care provider. Make sure you discuss any questions you have with your health care provider.    Document Released: 02/13/2011 Document Revised: 03/17/2014 Document Reviewed: 12/29/2012 Elsevier Interactive Patient Education [employer].

## 2015-11-19 NOTE — Assessment & Plan Note (Addendum)
Check TSH; patient reports more activity and healthier eating, but will check to r/o hyperthyroidism

## 2015-11-19 NOTE — Assessment & Plan Note (Signed)
Monitor CBC and creatinine on the NSAID, monitor SGPT on statin

## 2015-11-19 NOTE — Progress Notes (Signed)
Patient ID: Samuel Burnett, male   DOB: [birth year], 55 y.o.   MRN: [id number]   Subjective:   Samuel Burnett is a 55 y.o. male here for a complete physical exam He is new to me, as his previous provider left the practice; his last visit was in Feb 2017  Interim issues since last visit: none  USPSTF grade A and B recommendations -- postpone and let's talk about current medical issues I started a physical note, but then we realized that patient would need to come back for the physical, since we had so many problems to discuss and he needed medicine refills Osteoporosis: no prednisone as a child; used hot towels as a child for scoliosis Aspirin: allergic to aspirin Diet: he eats more vegetables, not much pork, eating more healthy Exercise: walking, walking now to get around after losing car ----------------------------------------------- We reviewed his several different chronic issues and medications that he takes  His back issues are handled here; he does not see ortho; he says he needs refills of oxycodone; he says the oxycodone was prescribed here; has been out for seven months; I reviewed the med list and the last oxycodone Rx was from Dr. Nadine Counts in Feb for 30 pills Using duloxetine; no hx of self-harm; medicine is working well; for anxiety attacks, can't sleep; on mirtazipine 15 mg but not as effective; worked well at first, less effective now  He takes pravastatin for high cholesterol; was eating the wrong things, used to go out to eat a lot; now eating healthier; no muscle aches on the medicine  HTN; on amlodipine; does have constipation; takes it at night  Uses ropinorole for restless legs; does get restless even on the medicine though  Was supposed to take vit D once a week he says; I looked through old labs in [place] and do not see a vitamin D level  COPD; on Breo, not limited; able to do what he needs to do with current medicine  He has HIV and is on no anti-retrovirals; he  is enrolled in a study at a tertiary care center on no medicines  Past Medical History:  Diagnosis Date  . Allergy   . Controlled insomnia   . Eczema   . HIV infection ([place])   . Hyperlipidemia   . Hypertension   . Numbness in right leg    outside of right foot, related to medical device implant in spine  . Osteoarthritis of lumbar spine    also- hips  . Other forms of scoliosis, thoracolumbar region   . Restless leg syndrome    Past Surgical History:  Procedure Laterality Date  . COLONOSCOPY WITH PROPOFOL N/A 12/01/2014   Procedure: COLONOSCOPY WITH PROPOFOL;  Surgeon: [name], MD;  Location: [facility];  Service: Endoscopy;  Laterality: N/A;  . EYE SURGERY  age 48   uncross eyes  . POLYPECTOMY  12/01/2014   Procedure: POLYPECTOMY;  Surgeon: [name], MD;  Location: [facility];  Service: Endoscopy;;  . SPINE SURGERY  01/21/12   implanted medtronics SCS   Family History  Problem Relation Age of Onset  . Lupus Mother   . Cancer Father     Pancreatis  . Diabetes Sister   . Diabetes Brother    Social History  Substance Use Topics  . Smoking status: Current Every Day Smoker    Packs/day: 0.50    Years: 30.00    Types: Cigarettes    Start date: 09/11/1984  . Smokeless tobacco: Never  Used  . Alcohol use No     Comment: may drink on "special occasions"  MD note: He is "actively quitting"; using wellbutrin and decreasing him from smoking  Review of Systems  Cardiovascular: Negative for leg swelling.  Skin: Positive for rash (cheeks).   He is not concerned about weight loss; eating better; has been more active since he no longer has a car  Objective:   Vitals:   11/19/15 1420  BP: 124/80  Pulse: 74  Temp: 98.4 F (36.9 C)  SpO2: 95%  Weight: 157 lb 8 oz (71.4 kg)   Body mass index is 23.26 kg/m. Wt Readings from Last 3 Encounters:  11/19/15 157 lb 8 oz (71.4 kg)  05/01/15 161 lb 8 oz (73.3 kg)  04/24/15 167 lb 8 oz (76 kg)   Physical  Exam  Constitutional: He appears well-developed and well-nourished. No distress.  HENT:  Head: Normocephalic and atraumatic.  Eyes: EOM are normal. No scleral icterus.  Neck: No thyromegaly present.  Cardiovascular: Normal rate and regular rhythm.   Pulmonary/Chest: Effort normal and breath sounds normal.  Abdominal: Soft. Bowel sounds are normal. He exhibits no distension.  Musculoskeletal: He exhibits no edema.  Neurological: Coordination normal.  Skin: Skin is warm and dry. Rash (erythematous papular rash middle third of face) noted. No pallor.  Psychiatric: He has a normal mood and affect. His behavior is normal. Judgment and thought content normal.    Assessment/Plan:   Problem List Items Addressed This Visit      Cardiovascular and Mediastinum   Hypertension goal BP (blood pressure) < 140/90    Continue medicine; try DASH guidelines; glad he is more active and eating better      Relevant Medications   pravastatin (PRAVACHOL) 20 MG tablet   Other Relevant Orders   Microalbumin / creatinine urine ratio (Completed)     Respiratory   Allergic rhinitis    Avoid decongestants with his HTN        Musculoskeletal and Integument   Rosacea    Start metronidazole topically      Degenerative arthritis of lumbar spine    Refer to pain clinic; I explained that I won't be prescribing oxycodone as his previous provider did; I will be glad to refer him to a pain specialist to see if injections or other modalities would be beneficial; he has been on gabapentin 300 mg four times a day; I suggested switching to 400 mg three times a day to improve compliance; he agrees      Relevant Orders   Ambulatory referral to Pain Clinic     Other   Weight loss, abnormal    Check TSH; patient reports more activity and healthier eating, but will check to r/o hyperthyroidism      Relevant Orders   TSH (Completed)   Vitamin D deficiency    Check level today and Rx only if needed       Relevant Orders   VITAMIN D 25 Hydroxy (Vit-D Deficiency, Fractures) (Completed)   Restless leg    Continue restless legs medicine      HLD (hyperlipidemia) - Primary    Check lipids, continue statin; check sgpt on the statin      Relevant Medications   pravastatin (PRAVACHOL) 20 MG tablet   Other Relevant Orders   Lipid panel (Completed)   HIV (human immunodeficiency virus infection) ([place])    Followed by Dr[facility] Del[facility] at [facility]; on no anti-retrovirals as part of a study  Encounter for medication monitoring    Monitor CBC and creatinine on the NSAID, monitor SGPT on statin      Relevant Orders   CBC with Differential/Platelet (Completed)   COMPLETE METABOLIC PANEL WITH GFR (Completed)   Current tobacco use    See AVS; encouraged him to quit smoking      Anxiety and depression    With insomnia; will increase the remeron from 15 mg to 30 mg nightly       Other Visit Diagnoses   None.     Meds ordered this encounter  Medications  . gabapentin (NEURONTIN) 400 MG capsule    Sig: Take 1 capsule (400 mg total) by mouth 3 (three) times daily.    Dispense:  270 capsule    Refill:  1  . DULoxetine (CYMBALTA) 60 MG capsule    Sig: Take 1 capsule (60 mg total) by mouth daily.    Dispense:  90 capsule    Refill:  1  . mirtazapine (REMERON) 30 MG tablet    Sig: Take 1 tablet (30 mg total) by mouth at bedtime.    Dispense:  30 tablet    Refill:  5    New higher dose  . pravastatin (PRAVACHOL) 20 MG tablet    Sig: Take 1 tablet (20 mg total) by mouth at bedtime.    Dispense:  30 tablet    Refill:  5  . metroNIDAZOLE (METROGEL) 1 % gel    Sig: Apply topically daily.    Dispense:  45 g    Refill:  5   Orders Placed This Encounter  Procedures  . TSH  . CBC with Differential/Platelet  . Lipid panel  . Microalbumin / creatinine urine ratio  . COMPLETE METABOLIC PANEL WITH GFR  . VITAMIN D 25 Hydroxy (Vit-D Deficiency, Fractures)  . Ambulatory referral to  Pain Clinic    Referral Priority:   Routine    Referral Type:   Consultation    Referral Reason:   Specialty Services Required    Requested Specialty:   Pain Medicine    Number of Visits Requested:   1    Follow up plan: No Follow-up on file.  An After Visit Summary was printed and given to the patient.

## 2015-11-19 NOTE — Assessment & Plan Note (Addendum)
Continue medicine; try DASH guidelines; glad he is more active and eating better

## 2015-11-19 NOTE — Assessment & Plan Note (Addendum)
Continue restless legs medicine

## 2015-11-19 NOTE — Assessment & Plan Note (Signed)
See AVS; encouraged him to quit smoking

## 2015-11-19 NOTE — Assessment & Plan Note (Signed)
Check level today and Rx only if needed

## 2015-11-19 NOTE — Assessment & Plan Note (Addendum)
Refer to pain clinic; I explained that I won't be prescribing oxycodone as his previous provider did; I will be glad to refer him to a pain specialist to see if injections or other modalities would be beneficial; he has been on gabapentin 300 mg four times a day; I suggested switching to 400 mg three times a day to improve compliance; he agrees

## 2015-11-19 NOTE — Assessment & Plan Note (Signed)
Check lipids, continue statin; check sgpt on the statin

## 2015-11-20 LAB — CBC WITH DIFFERENTIAL/PLATELET
Basophils Absolute: 37 cells/uL (ref 0–200)
Basophils Relative: 1 %
Eosinophils Absolute: 370 cells/uL (ref 15–500)
Eosinophils Relative: 10 %
HCT: 43.9 % (ref 38.5–50.0)
Hemoglobin: 14.9 g/dL (ref 13.2–17.1)
Lymphocytes Relative: 56 %
Lymphs Abs: 2072 cells/uL (ref 850–3900)
MCH: 31.3 pg (ref 27.0–33.0)
MCHC: 33.9 g/dL (ref 32.0–36.0)
MCV: 92.2 fL (ref 80.0–100.0)
MPV: 12.7 fL — ABNORMAL HIGH (ref 7.5–12.5)
Monocytes Absolute: 481 cells/uL (ref 200–950)
Monocytes Relative: 13 %
Neutro Abs: 740 cells/uL — ABNORMAL LOW (ref 1500–7800)
Neutrophils Relative %: 20 %
Platelets: 236 10*3/uL (ref 140–400)
RBC: 4.76 MIL/uL (ref 4.20–5.80)
RDW: 14.6 % (ref 11.0–15.0)
WBC: 3.7 10*3/uL — ABNORMAL LOW (ref 3.8–10.8)

## 2015-11-20 LAB — VITAMIN D 25 HYDROXY (VIT D DEFICIENCY, FRACTURES): Vit D, 25-Hydroxy: 24 ng/mL — ABNORMAL LOW (ref 30–100)

## 2015-11-20 LAB — MICROALBUMIN / CREATININE URINE RATIO
Creatinine, Urine: 109 mg/dL (ref 20–370)
Microalb Creat Ratio: 5 mcg/mg creat (ref <30)
Microalb, Ur: 0.5 mg/dL

## 2015-11-20 LAB — TSH: TSH: 2.08 mIU/L (ref 0.40–4.50)

## 2015-11-21 ENCOUNTER — Encounter: Payer: Self-pay | Admitting: Family Medicine

## 2015-11-21 DIAGNOSIS — L719 Rosacea, unspecified: Secondary | ICD-10-CM | POA: Insufficient documentation

## 2015-11-21 NOTE — Assessment & Plan Note (Signed)
Start metronidazole topically

## 2015-11-21 NOTE — Assessment & Plan Note (Signed)
Followed by Dr. Delma Post at [facility]; on no anti-retrovirals as part of a study

## 2015-11-21 NOTE — Assessment & Plan Note (Signed)
Avoid decongestants with his HTN

## 2015-11-21 NOTE — Assessment & Plan Note (Signed)
With insomnia; will increase the remeron from 15 mg to 30 mg nightly

## 2015-11-22 ENCOUNTER — Other Ambulatory Visit: Payer: Self-pay | Admitting: Family Medicine

## 2015-11-22 NOTE — Progress Notes (Signed)
We opted to not use this; no controlled substances being prescribed for him after all

## 2015-11-26 ENCOUNTER — Other Ambulatory Visit: Payer: Self-pay | Admitting: Family Medicine

## 2015-11-26 MED ORDER — TRAZODONE HCL 50 MG PO TABS: 25.0000 mg | ORAL_TABLET | Freq: Every evening | ORAL | 3 refills | Status: DC | PRN

## 2015-11-26 NOTE — Progress Notes (Signed)
Stop remeron; try trazodone

## 2015-12-04 ENCOUNTER — Other Ambulatory Visit: Payer: Self-pay

## 2015-12-04 MED ORDER — AMLODIPINE BESYLATE 5 MG PO TABS: 5.0000 mg | ORAL_TABLET | Freq: Every day | ORAL | 3 refills | Status: DC

## 2015-12-08 ENCOUNTER — Ambulatory Visit
Admission: EM | Admit: 2015-12-08 | Discharge: 2015-12-08 | Disposition: A | Discharge status: Home / Self Care | Payer: Medicare Other | Attending: Family Medicine | Admitting: Family Medicine

## 2015-12-08 DIAGNOSIS — G8929 Other chronic pain: Secondary | ICD-10-CM | POA: Diagnosis not present

## 2015-12-08 DIAGNOSIS — M545 Low back pain, unspecified: Secondary | ICD-10-CM

## 2015-12-08 DIAGNOSIS — M549 Dorsalgia, unspecified: Secondary | ICD-10-CM | POA: Diagnosis not present

## 2015-12-08 MED ORDER — MELOXICAM 15 MG PO TABS: ORAL_TABLET | ORAL | 1 refills | Status: DC

## 2015-12-08 MED ORDER — KETOROLAC TROMETHAMINE 60 MG/2ML IM SOLN
30.0000 mg | Freq: Once | INTRAMUSCULAR | Status: AC
  Administered 2015-12-08: 30 mg via INTRAMUSCULAR

## 2015-12-08 MED ORDER — TIZANIDINE HCL 4 MG PO TABS: 4.0000 mg | ORAL_TABLET | Freq: Three times a day (TID) | ORAL | 0 refills | Status: DC

## 2015-12-08 NOTE — ED Provider Notes (Signed)
CSN: [id number]     Arrival date & time 12/08/15  0844 History   First MD Initiated Contact with Patient 12/08/15 [phone]     Chief Complaint  Patient presents with  . Back Pain   (Consider location/radiation/quality/duration/timing/severity/associated sxs/prior Treatment) HPI  This a 55 year old male who presents with back pain leading into his right buttocks posterior thigh and lateral calf to about the mid calf level. She has a history of low back problems currently being on Social Security disability for his back. He has an implanted spinal stimulator that he states it is not beneficial when the weather changes to cool. He states for the last 48 hours he has been fairly miserable. He lists aspirin and ibuprofen as allergies but is able to tolerate other nonsteroidals. The ibuprofen does cause him to have a stomach upset when he takes it for a length of time. He states when the pain does occur with the weather changes that in the past he has been treated with narcotic medications. I told him I will not treat him with narcotics for back pain and because of his implantable spinal stimulator should be under the care of a pain management specialist. Has moved here  from [place] some 2 years ago and has discussed recently with his primary care physician Dr. Sanda Klein who is going to arrange a consult. He denies any incontinence.    Past Medical History:  Diagnosis Date  . Allergy   . Controlled insomnia   . Eczema   . HIV infection ( [place])   . Hyperlipidemia   . Hypertension   . Numbness in right leg    outside of right foot, related to medical device implant in spine  . Osteoarthritis of lumbar spine    also- hips  . Other forms of scoliosis, thoracolumbar region   . Restless leg syndrome    Past Surgical History:  Procedure Laterality Date  . COLONOSCOPY WITH PROPOFOL N/A 12/01/2014   Procedure: COLONOSCOPY WITH PROPOFOL;  Surgeon: [name], MD;  Location: [place];  Service:  Endoscopy;  Laterality: N/A;  . EYE SURGERY  age 28   uncross eyes  . POLYPECTOMY  12/01/2014   Procedure: POLYPECTOMY;  Surgeon: [name], MD;  Location: [place];  Service: Endoscopy;;  . SPINE SURGERY  01/21/12   implanted [employer]tron[employer] SCS   Family History  Problem Relation Age of Onset  . Lupus Mother   . Cancer Father     Pancreatis  . Diabetes Sister   . Diabetes Brother    Social History  Substance Use Topics  . Smoking status: Former Smoker    Packs/day: 0.50    Years: 30.00    Types: Cigarettes    Start date: 09/11/1984    Quit date: 11/08/2015  . Smokeless tobacco: Never Used  . Alcohol use No     Comment: may drink on "special occasions"    Review of Systems  Constitutional: Positive for activity change. Negative for chills, fatigue and fever.  Musculoskeletal: Positive for back pain.  All other systems reviewed and are negative.   Allergies  Fentanyl; Shellfish allergy; Tomato; Aspirin; Dust mite extract; Penicillin g; and Pollen extract  Home Medications   Prior to Admission medications   Medication Sig Start Date End Date Taking? Authorizing Provider  amLODipine (NORVASC) 5 MG tablet Take 1 tablet (5 mg total) by mouth daily. 12/04/15  Yes Arnetha Courser, MD  buPROPion (WELLBUTRIN SR) 150 MG 12 hr tablet TAKE 1 TABLET (  150 MG TOTAL) BY MOUTH 2 (TWO) TIMES DAILY. 04/10/15  Yes Bobetta Lime, MD  DULoxetine (CYMBALTA) 60 MG capsule Take 1 capsule (60 mg total) by mouth daily. 11/19/15  Yes Arnetha Courser, MD  Ergocalciferol 2000 UNITS TABS Take by mouth. 05/12/14  Yes Historical Provider, MD  fluticasone (FLONASE) 50 MCG/ACT nasal spray Place into the nose. 05/11/14  Yes Historical Provider, MD  Fluticasone Furoate-Vilanterol (BREO ELLIPTA) 100-25 MCG/INH AEPB Inhale 1 puff into the lungs once. 09/12/14  Yes Steele Sizer, MD  gabapentin (NEURONTIN) 400 MG capsule Take 1 capsule (400 mg total) by mouth 3 (three) times daily. 11/19/15  Yes Arnetha Courser, MD   Hydrocortisone Acetate 1 % CREA HYDROCORTISONE ACETATE, 1% (External Cream) - Historical Medication  (1 %) Active   Yes Historical Provider, MD  metroNIDAZOLE (METROGEL) 1 % gel Apply topically daily. 11/19/15  Yes Arnetha Courser, MD  pravastatin (PRAVACHOL) 20 MG tablet Take 1 tablet (20 mg total) by mouth at bedtime. 11/19/15  Yes Arnetha Courser, MD  rOPINIRole (REQUIP) 1 MG tablet Take 1 tablet (1 mg total) by mouth at bedtime. 12/11/14  Yes Bobetta Lime, MD  traZODone (DESYREL) 50 MG tablet Take 0.5-1 tablets (25-50 mg total) by mouth at bedtime as needed for sleep. 11/26/15  Yes Arnetha Courser, MD  meloxicam (MOBIC) 15 MG tablet TAKE 1 TABLET ORALLY ONCE A DAY 12/08/15   Lorin Picket, PA-C  tiZANidine (ZANAFLEX) 4 MG tablet Take 1 tablet (4 mg total) by mouth 3 (three) times daily. 12/08/15   Lorin Picket, PA-C   Meds Ordered and Administered this Visit   Medications  ketorolac (TORADOL) injection 30 mg (30 mg Intramuscular Given 12/08/15 0945)    BP (!) 121/93 (BP Location: Left Arm)   Pulse 94   Temp 97.7 F (36.5 C) (Tympanic)   Resp 17   Ht 5\' 9"  (1.753 m)   Wt 157 lb 12.8 oz (71.6 kg)   SpO2 98%   BMI 23.30 kg/m  No data found.   Physical Exam  Constitutional: He is oriented to person, place, and time. He appears well-developed and well-nourished. No distress.  HENT:  Head: Normocephalic and atraumatic.  Eyes: EOM are normal. Pupils are equal, round, and reactive to light.  Neck: Normal range of motion. Neck supple.  Musculoskeletal: He exhibits no edema, tenderness or deformity.  His admission of the lumbar spine shows a previous well-healed incisions from implantation of a spinal stimulator. He has a level pelvis in stance. Range of motion is good without complaints of discomfort. Patient is able to toe and heel walk adequately. DTRs are 2+ over 4 and bilaterally symmetrical.  Neurological: He is alert and oriented to person, place, and time. He has normal  reflexes. He displays normal reflexes. He exhibits normal muscle tone. Coordination normal.  Skin: Skin is warm and dry. He is not diaphoretic.  Psychiatric: He has a normal mood and affect. His behavior is normal. Judgment and thought content normal.  Nursing note and vitals reviewed.   Urgent Care Course   Clinical Course    Procedures (including critical care time)  Labs Review Labs Reviewed - No data to display  Imaging Review No results found.   Visual Acuity Review  Right Eye Distance:   Left Eye Distance:   Bilateral Distance:    Right Eye Near:   Left Eye Near:    Bilateral Near:     Medications  ketorolac (TORADOL) injection 30 mg (  30 mg Intramuscular Given 12/08/15 0945)      MDM   1. Chronic back pain   2. Chronic lumbar pain    New Prescriptions   TIZANIDINE (ZANAFLEX) 4 MG TABLET    Take 1 tablet (4 mg total) by mouth 3 (three) times daily.  Plan: 1. Test/x-ray results and diagnosis reviewed with patient 2. rx as per orders; risks, benefits, potential side effects reviewed with patient 3. Recommend supportive treatment with Symptom avoidance. I also renewed his prescription for Mobic 15 mg at a can take daily. I have highly recommend that he follow-up with his primary care physician for referral to a pain management specialist. The weather was only turned colder over the next few months and he will need some evaluation and intervention to help ease that transition. 4. F/u prn if symptoms worsen or don't improve     Lorin Picket, PA-C 12/08/15 [phone]

## 2015-12-08 NOTE — ED Triage Notes (Signed)
Patient complains of low back pain. Patient has history with multiple surgeries to lower back. Patient reports that this started 2 days ago and tends to occur when the weather changes.

## 2015-12-10 ENCOUNTER — Telehealth: Payer: Self-pay | Admitting: Emergency Medicine

## 2015-12-10 NOTE — Telephone Encounter (Signed)
Patient returning our phone called.  Patient states that his back pain has improved and that he has a follow-up appointment with his PCP tomorrow.

## 2015-12-20 ENCOUNTER — Encounter: Payer: Self-pay | Admitting: Family Medicine

## 2015-12-20 ENCOUNTER — Other Ambulatory Visit: Payer: Self-pay | Admitting: Family Medicine

## 2015-12-20 ENCOUNTER — Ambulatory Visit (INDEPENDENT_AMBULATORY_CARE_PROVIDER_SITE_OTHER): Payer: Medicare Other | Admitting: Family Medicine

## 2015-12-20 VITALS — BP 118/72 | HR 95 | Temp 99.0°F | Resp 16 | Ht 69.0 in | Wt 157.0 lb

## 2015-12-20 DIAGNOSIS — I252 Old myocardial infarction: Secondary | ICD-10-CM

## 2015-12-20 DIAGNOSIS — Z23 Encounter for immunization: Secondary | ICD-10-CM | POA: Diagnosis not present

## 2015-12-20 DIAGNOSIS — Z113 Encounter for screening for infections with a predominantly sexual mode of transmission: Secondary | ICD-10-CM

## 2015-12-20 DIAGNOSIS — Z0000 Encounter for general adult medical examination without abnormal findings: Secondary | ICD-10-CM | POA: Diagnosis not present

## 2015-12-20 DIAGNOSIS — F17210 Nicotine dependence, cigarettes, uncomplicated: Secondary | ICD-10-CM

## 2015-12-20 DIAGNOSIS — Z125 Encounter for screening for malignant neoplasm of prostate: Secondary | ICD-10-CM | POA: Diagnosis not present

## 2015-12-20 DIAGNOSIS — Z7952 Long term (current) use of systemic steroids: Secondary | ICD-10-CM

## 2015-12-20 HISTORY — DX: Encounter for general adult medical examination without abnormal findings: Z00.00

## 2015-12-20 HISTORY — DX: Long term (current) use of systemic steroids: Z79.52

## 2015-12-20 HISTORY — DX: Old myocardial infarction: I25.2

## 2015-12-20 LAB — HEMOCCULT GUIAC POC 1CARD (OFFICE)
Card #1 Date: 10122017
Card #2 Fecal Occult Blod, POC: NEGATIVE

## 2015-12-20 LAB — PSA: PSA: 0.8 ng/mL (ref <=4.0)

## 2015-12-20 NOTE — Patient Instructions (Addendum)
I do encourage you to quit smoking Call [phone] to sign up for smoking cessation classes You can call 1-800-QUIT-NOW to talk with a smoking cessation coach  Please do call to schedule your bone density study; the number to schedule one at either [facility] or [facility] is [phone]  We'll have you see the cardiologist  Try to limit saturated fats in your diet (bologna, hot dogs, barbeque, cheeseburgers, hamburgers, steak, bacon, sausage, cheese, etc.) and get more fresh fruits, vegetables, and whole grains  Health Maintenance, Male A healthy lifestyle and preventative care can promote health and wellness.  Maintain regular health, dental, and eye exams.  Eat a healthy diet. Foods like vegetables, fruits, whole grains, low-fat dairy products, and lean protein foods contain the nutrients you need and are low in calories. Decrease your intake of foods high in solid fats, added sugars, and salt. Get information about a proper diet from your health care provider, if necessary.  Regular physical exercise is one of the most important things you can do for your health. Most adults should get at least 150 minutes of moderate-intensity exercise (any activity that increases your heart rate and causes you to sweat) each week. In addition, most adults need muscle-strengthening exercises on 2 or more days a week.   Maintain a healthy weight. The body mass index (BMI) is a screening tool to identify possible weight problems. It provides an estimate of body fat based on height and weight. Your health care provider can find your BMI and can help you achieve or maintain a healthy weight. For males 20 years and older:  A BMI below 18.5 is considered underweight.  A BMI of 18.5 to 24.9 is normal.  A BMI of 25 to 29.9 is considered overweight.  A BMI of 30 and above is considered obese.  Maintain normal blood lipids and cholesterol by exercising and minimizing your  intake of saturated fat. Eat a balanced diet with plenty of fruits and vegetables. Blood tests for lipids and cholesterol should begin at age 56 and be repeated every 5 years. If your lipid or cholesterol levels are high, you are over age 42, or you are at high risk for heart disease, you may need your cholesterol levels checked more frequently.Ongoing high lipid and cholesterol levels should be treated with medicines if diet and exercise are not working.  If you smoke, find out from your health care provider how to quit. If you do not use tobacco, do not start.  Lung cancer screening is recommended for adults aged 58-80 years who are at high risk for developing lung cancer because of a history of smoking. A yearly low-dose CT scan of the lungs is recommended for people who have at least a 30-pack-year history of smoking and are current smokers or have quit within the past 15 years. A pack year of smoking is smoking an average of 1 pack of cigarettes a day for 1 year (for example, a 30-pack-year history of smoking could mean smoking 1 pack a day for 30 years or 2 packs a day for 15 years). Yearly screening should continue until the smoker has stopped smoking for at least 15 years. Yearly screening should be stopped for people who develop a health problem that would prevent them from having lung cancer treatment.  If you choose to drink alcohol, do not have more than 2 drinks per day. One drink is considered to be 12 oz (360 mL) of beer, 5 oz (  150 mL) of wine, or 1.5 oz (45 mL) of liquor.  Avoid the use of street drugs. Do not share needles with anyone. Ask for help if you need support or instructions about stopping the use of drugs.  High blood pressure causes heart disease and increases the risk of stroke. High blood pressure is more likely to develop in:  People who have blood pressure in the end of the normal range (100-139/85-89 mm Hg).  People who are overweight or obese.  People who are  African American.  If you are 99-62 years of age, have your blood pressure checked every 3-5 years. If you are 43 years of age or older, have your blood pressure checked every year. You should have your blood pressure measured twice--once when you are at a hospital or clinic, and once when you are not at a hospital or clinic. Record the average of the two measurements. To check your blood pressure when you are not at a hospital or clinic, you can use:  An automated blood pressure machine at a pharmacy.  A home blood pressure monitor.  If you are 66-71 years old, ask your health care provider if you should take aspirin to prevent heart disease.  Diabetes screening involves taking a blood sample to check your fasting blood sugar level. This should be done once every 3 years after age 30 if you are at a normal weight and without risk factors for diabetes. Testing should be considered at a younger age or be carried out more frequently if you are overweight and have at least 1 risk factor for diabetes.  Colorectal cancer can be detected and often prevented. Most routine colorectal cancer screening begins at the age of 22 and continues through age 26. However, your health care provider may recommend screening at an earlier age if you have risk factors for colon cancer. On a yearly basis, your health care provider may provide home test kits to check for hidden blood in the stool. A small camera at the end of a tube may be used to directly examine the colon (sigmoidoscopy or colonoscopy) to detect the earliest forms of colorectal cancer. Talk to your health care provider about this at age 23 when routine screening begins. A direct exam of the colon should be repeated every 5-10 years through age 35, unless early forms of precancerous polyps or small growths are found.  People who are at an increased risk for hepatitis B should be screened for this virus. You are considered at high risk for hepatitis B  if:  You were born in a country where hepatitis B occurs often. Talk with your health care provider about which countries are considered high risk.  Your parents were born in a high-risk country and you have not received a shot to protect against hepatitis B (hepatitis B vaccine).  You have HIV or AIDS.  You use needles to inject street drugs.  You live with, or have sex with, someone who has hepatitis B.  You are a man who has sex with other men (MSM).  You get hemodialysis treatment.  You take certain medicines for conditions like cancer, organ transplantation, and autoimmune conditions.  Hepatitis C blood testing is recommended for all people born from [birth year] through 1967 and any individual with known risk factors for hepatitis C.  Healthy men should no longer receive prostate-specific antigen (PSA) blood tests as part of routine cancer screening. Talk to your health care provider about prostate cancer screening.  Testicular cancer screening is not recommended for adolescents or adult males who have no symptoms. Screening includes self-exam, a health care provider exam, and other screening tests. Consult with your health care provider about any symptoms you have or any concerns you have about testicular cancer.  Practice safe sex. Use condoms and avoid high-risk sexual practices to reduce the spread of sexually transmitted infections (STIs).  You should be screened for STIs, including gonorrhea and chlamydia if:  You are sexually active and are younger than 24 years.  You are older than 24 years, and your health care provider tells you that you are at risk for this type of infection.  Your sexual activity has changed since you were last screened, and you are at an increased risk for chlamydia or gonorrhea. Ask your health care provider if you are at risk.  If you are at risk of being infected with HIV, it is recommended that you take a prescription medicine daily to prevent HIV  infection. This is called pre-exposure prophylaxis (PrEP). You are considered at risk if:  You are a man who has sex with other men (MSM).  You are a heterosexual man who is sexually active with multiple partners.  You take drugs by injection.  You are sexually active with a partner who has HIV.  Talk with your health care provider about whether you are at high risk of being infected with HIV. If you choose to begin PrEP, you should first be tested for HIV. You should then be tested every 3 months for as long as you are taking PrEP.  Use sunscreen. Apply sunscreen liberally and repeatedly throughout the day. You should seek shade when your shadow is shorter than you. Protect yourself by wearing long sleeves, pants, a wide-brimmed hat, and sunglasses year round whenever you are outdoors.  Tell your health care provider of new moles or changes in moles, especially if there is a change in shape or color. Also, tell your health care provider if a mole is larger than the size of a pencil eraser.  A one-time screening for abdominal aortic aneurysm (AAA) and surgical repair of large AAAs by ultrasound is recommended for men aged 69-75 years who are current or former smokers.  Stay current with your vaccines (immunizations).   This information is not intended to replace advice given to you by your health care provider. Make sure you discuss any questions you have with your health care provider.   Document Released: 08/23/2007 Document Revised: 03/17/2014 Document Reviewed: 07/22/2010 Elsevier Interactive Patient Education [employer].

## 2015-12-20 NOTE — Assessment & Plan Note (Signed)
Order DEXA 

## 2015-12-20 NOTE — Progress Notes (Signed)
Patient ID: Samuel Burnett, male   DOB: 03-Jul-1960, 55 y.o.   MRN: [id number]   Subjective:   Rajender Sokolski is a 55 y.o. male here for a complete physical exam  Interim issues since last visit: no medical excitement; he has not heard back about the pain clinic yet; he'll check up front to get the appt  USPSTF grade A and B recommendations Alcohol: no Depression:  Depression screen [facility] 2/9 12/20/2015 11/19/2015 05/01/2015 04/24/2015 11/15/2014  Decreased Interest 0 0 0 0 0  Down, Depressed, Hopeless 0 0 0 0 0  PHQ - 2 Score 0 0 0 0 0   Hypertension: controlled Obesity: stable Tobacco use: 2 cigarettes a day; he used to smoke 1.5 ppd; he thought about Chantix; stressed over car he is going to buy HIV, hep B, hep C: Dr. Napoleon Form; they checked for that STD testing and prevention (chl/gon/syphilis): wishes to have that Lipids: just done in Sept Glucose: just done in Sept Colorectal cancer: UTD; just had one Sept 2021 Breast cancer: no lumps Lung cancer: order CT scan Osteoporosis: 2 months of steroids as an adult, prior to 2013 AAA: n/a Aspirin: allergic to aspirin Diet: bakes greens, avoid saturated fats Exercise: does ride bike, wears helmet Skin cancer: no worrisome moles  Past Medical History:  Diagnosis Date  . Allergy   . Controlled insomnia   . Eczema   . HIV infection ([place])   . Hyperlipidemia   . Hypertension   . Numbness in right leg    outside of right foot, related to medical device implant in spine  . Osteoarthritis of lumbar spine    also- hips  . Other forms of scoliosis, thoracolumbar region   . Restless leg syndrome    Past Surgical History:  Procedure Laterality Date  . COLONOSCOPY WITH PROPOFOL N/A 12/01/2014   Procedure: COLONOSCOPY WITH PROPOFOL;  Surgeon: [name], MD;  Location: [place];  Service: Endoscopy;  Laterality: N/A;  . EYE SURGERY  age 42   uncross eyes  . POLYPECTOMY  12/01/2014   Procedure: POLYPECTOMY;  Surgeon: [name], MD;  Location: [place];  Service: Endoscopy;;  . SPINE SURGERY  01/21/12   implanted medtronics SCS   Family History  Problem Relation Age of Onset  . Lupus Mother   . Cancer Father     Pancreatis  . Diabetes Sister   . Diabetes Brother    Social History  Substance Use Topics  . Smoking status: Former Smoker    Packs/day: 0.50    Years: 30.00    Types: Cigarettes    Start date: 09/11/1984    Quit date: 11/08/2015  . Smokeless tobacco: Never Used  . Alcohol use No     Comment: may drink on "special occasions"   Review of Systems  Cardiovascular: Positive for chest pain (sometimes when riding bike, going up a hill; really hoofing it, but better now; neg stress test).  He further clarifies that it was more when he was first riding his bike everywhere and he was having to work really hard; now that he rides everywhere, not a problem; he had a negative stress test he says  Objective:   Vitals:   12/20/15 1415  BP: 118/72  Pulse: 95  Resp: 16  Temp: 99 F (37.2 C)  TempSrc: Oral  SpO2: 95%  Weight: 157 lb (71.2 kg)  Height: 5\' 9"  (1.753 m)   Body mass index is 23.18 kg/m. Wt Readings from Last 3  Encounters:  12/20/15 157 lb (71.2 kg)  12/08/15 157 lb 12.8 oz (71.6 kg)  11/19/15 157 lb 8 oz (71.4 kg)   Physical Exam  Constitutional: He appears well-developed and well-nourished. No distress.  HENT:  Head: Normocephalic and atraumatic.  Nose: Nose normal.  Mouth/Throat: Oropharynx is clear and moist.  Eyes: EOM are normal. No scleral icterus.  Neck: No JVD present. No thyromegaly present.  Cardiovascular: Normal rate, regular rhythm and normal heart sounds.   Pulmonary/Chest: Effort normal and breath sounds normal. No respiratory distress. He has no wheezes. He has no rales.  Abdominal: Soft. Bowel sounds are normal. He exhibits no distension. There is no tenderness. There is no guarding.  Genitourinary: Rectal exam shows no mass, no tenderness and  anal tone normal. Prostate is not enlarged and not tender.  Musculoskeletal: Normal range of motion. He exhibits no edema.  Lymphadenopathy:    He has no cervical adenopathy.  Neurological: He is alert. He displays normal reflexes. He exhibits normal muscle tone. Coordination normal.  Skin: Skin is warm and dry. No rash noted. He is not diaphoretic. No erythema. No pallor.  Psychiatric: He has a normal mood and affect. His behavior is normal. Judgment and thought content normal. His mood appears not anxious. He does not exhibit a depressed mood.    Assessment/Plan:   Problem List Items Addressed This Visit      Other   Screen for STD (sexually transmitted disease)    test      Relevant Orders   GC/Chlamydia Probe Amp   Hepatitis C Antibody   RPR   Prostate cancer screening    Screen PSA      Relevant Orders   PSA (Completed)   POCT Occult Blood Stool (Completed)   Preventative health care    USPSTF grade A and B recommendations reviewed with patient; age-appropriate recommendations, preventive care, screening tests, etc discussed and encouraged; healthy living encouraged; see AVS for patient education given to patient      Long term systemic steroid user    Order DEXA      Relevant Orders   DG Bone Density   Hx of myocardial infarction    Cannot take aspirin; on statin, goal LDL under 70; refer to cardiologist      Relevant Orders   Ambulatory referral to Cardiology   Cigarette smoker    Order CT scan      Relevant Orders   CT CHEST LUNG CA SCREEN LOW DOSE W/O CM   Ambulatory referral to Cardiology    Other Visit Diagnoses    Need for Tdap vaccination    -  Primary   Relevant Orders   Tdap vaccine greater than or equal to 7yo IM (Completed)      Meds ordered this encounter  Medications  . mirtazapine (REMERON) 30 MG tablet   Orders Placed This Encounter  Procedures  . GC/Chlamydia Probe Amp  . CT CHEST LUNG CA SCREEN LOW DOSE W/O CM    Standing  Status:   Future    Standing Expiration Date:   02/18/2017    Order Specific Question:   Reason for Exam (SYMPTOM  OR DIAGNOSIS REQUIRED)    Answer:   smoking >30 pack years    Order Specific Question:   Preferred Imaging Location?    Answer:   [facility]  . DG Bone Density    Order Specific Question:   Reason for Exam (SYMPTOM  OR DIAGNOSIS REQUIRED)  Answer:   smoking >30 pack years    Order Specific Question:   Preferred imaging location?    Answer:   [facility]  . Tdap vaccine greater than or equal to 7yo IM  . Hepatitis C Antibody  . RPR  . PSA  . Ambulatory referral to Cardiology    Referral Priority:   Routine    Referral Type:   Consultation    Referral Reason:   Specialty Services Required    Requested Specialty:   Cardiology    Number of Visits Requested:   1  . POCT Occult Blood Stool    Follow up plan: Return in about 1 year (around 12/19/2016) for complete physical.  An After Visit Summary was printed and given to the patient.

## 2015-12-20 NOTE — Assessment & Plan Note (Signed)
test

## 2015-12-20 NOTE — Assessment & Plan Note (Signed)
Order CT scan 

## 2015-12-20 NOTE — Assessment & Plan Note (Signed)
Screen PSA

## 2015-12-20 NOTE — Assessment & Plan Note (Signed)
USPSTF grade A and B recommendations reviewed with patient; age-appropriate recommendations, preventive care, screening tests, etc discussed and encouraged; healthy living encouraged; see AVS for patient education given to patient  

## 2015-12-20 NOTE — Assessment & Plan Note (Signed)
Cannot take aspirin; on statin, goal LDL under 70; refer to cardiologist

## 2015-12-21 ENCOUNTER — Telehealth: Payer: Self-pay | Admitting: Family Medicine

## 2015-12-21 DIAGNOSIS — A530 Latent syphilis, unspecified as early or late: Secondary | ICD-10-CM

## 2015-12-21 LAB — HEPATITIS C ANTIBODY: HCV Ab: NEGATIVE

## 2015-12-21 LAB — FLUORESCENT TREPONEMAL AB(FTA)-IGG-BLD: Fluorescent Treponemal ABS: REACTIVE — AB

## 2015-12-21 LAB — GC/CHLAMYDIA PROBE AMP
CT Probe RNA: NOT DETECTED
GC Probe RNA: NOT DETECTED

## 2015-12-21 LAB — RPR TITER: RPR Titer: 1:2 {titer}

## 2015-12-21 LAB — RPR: RPR Ser Ql: REACTIVE — AB

## 2015-12-21 NOTE — Telephone Encounter (Signed)
Positive RPR; confirmatory testing in progress I tried to call patient; reached voicemail; called and just said I was calling about lab results Samuel Burnett -- please call the health department and let them know that we have a patient with positive RPR, please report,and we're requesting guidance for next step or will they do further testing from here

## 2015-12-24 NOTE — Telephone Encounter (Signed)
Let me know results and then we will fax that info to the health department.

## 2015-12-25 NOTE — Telephone Encounter (Signed)
I spoke with patient Patient is aware of syphilis antibody being present; he reports having had syphilis years ago, treated by his infectious disease doctor Please add to history and fax results to his ID specialist Thank you

## 2015-12-25 NOTE — Telephone Encounter (Signed)
Added to history and labs faxed to ID Docotor

## 2015-12-26 ENCOUNTER — Telehealth: Payer: Self-pay | Admitting: *Deleted

## 2015-12-26 NOTE — Telephone Encounter (Signed)
Received referral for initial lung cancer screening scan. Contacted patient and obtained smoking history,(former, quit 11/08/15, 30 pack year) as well as answering questions related to screening process. Patient denies signs of lung cancer such as weight loss or hemoptysis. Patient denies comorbidity that would prevent curative treatment if lung cancer were found. Patient is tentatively scheduled for shared decision making visit and CT scan on 01/08/16 at 1:30pm, pending insurance approval from business office.

## 2015-12-27 ENCOUNTER — Ambulatory Visit: Admission: RE | Admit: 2015-12-27 | Payer: Medicare Other | Source: Ambulatory Visit

## 2015-12-27 ENCOUNTER — Telehealth: Payer: Self-pay | Admitting: Family Medicine

## 2015-12-27 NOTE — Telephone Encounter (Signed)
PT IS CHECKING ON STATUS OF HIS PAIN SPECIALIST ( NEUROLOGIST). SAYS HE WAS HERE LAST WEEK (01-29-16)

## 2015-12-27 NOTE — Telephone Encounter (Signed)
I'm not sure of what referral he is referring to but none has been ordered regarding his pain and it was not mentioned in the note.

## 2015-12-28 NOTE — Telephone Encounter (Signed)
JAMIE OR DR LADA PLEASE ADVISE. PT THINKS THAT HE HAS A REFERRAL

## 2015-12-31 NOTE — Telephone Encounter (Signed)
Please advise 

## 2015-12-31 NOTE — Telephone Encounter (Signed)
The referral was entered in September to the pain clinic; please check on that for him

## 2016-01-01 ENCOUNTER — Telehealth: Payer: Self-pay

## 2016-01-01 NOTE — Telephone Encounter (Signed)
A message was left for this patient stating that a new referral has been placed with [facility] pain clinic and that he should receive a call by the end of this week, but if not to give [place] a call back.

## 2016-01-01 NOTE — Telephone Encounter (Signed)
Great; if you'll just keep the patient in the loop; I won't be prescribing any narcotics, so we'd like someone to help manage his pain

## 2016-01-01 NOTE — Telephone Encounter (Signed)
Since this message was put in the referral box from [facility]  Dr. Dossie Arbour declined referral. Will hold per Jocelyn Lamer, because we may be getting a new doctor, or Dr. Idelia Salm is trying to come back.   I sent this patient's information over to the pain center at [facility] to see if they could get him in right away.

## 2016-01-07 ENCOUNTER — Other Ambulatory Visit: Payer: Self-pay

## 2016-01-07 MED ORDER — ROPINIROLE HCL 1 MG PO TABS: 1.0000 mg | ORAL_TABLET | Freq: Every day | ORAL | 3 refills | Status: DC

## 2016-01-07 NOTE — Telephone Encounter (Signed)
rx approved

## 2016-01-14 ENCOUNTER — Other Ambulatory Visit: Payer: Self-pay | Admitting: *Deleted

## 2016-01-14 DIAGNOSIS — Z87891 Personal history of nicotine dependence: Secondary | ICD-10-CM

## 2016-01-15 ENCOUNTER — Inpatient Hospital Stay: Payer: Medicare Other | Attending: Oncology | Admitting: Oncology

## 2016-01-15 ENCOUNTER — Encounter: Payer: Self-pay | Admitting: Oncology

## 2016-01-15 ENCOUNTER — Ambulatory Visit
Admission: RE | Admit: 2016-01-15 | Discharge: 2016-01-15 | Disposition: A | Discharge status: Home / Self Care | Payer: Medicare Other | Source: Ambulatory Visit | Attending: Oncology | Admitting: Oncology

## 2016-01-15 DIAGNOSIS — Z87891 Personal history of nicotine dependence: Secondary | ICD-10-CM | POA: Diagnosis not present

## 2016-01-15 DIAGNOSIS — I2510 Atherosclerotic heart disease of native coronary artery without angina pectoris: Secondary | ICD-10-CM | POA: Insufficient documentation

## 2016-01-15 DIAGNOSIS — J439 Emphysema, unspecified: Secondary | ICD-10-CM | POA: Insufficient documentation

## 2016-01-15 DIAGNOSIS — Z122 Encounter for screening for malignant neoplasm of respiratory organs: Secondary | ICD-10-CM | POA: Diagnosis not present

## 2016-01-15 DIAGNOSIS — I700 Atherosclerosis of aorta: Secondary | ICD-10-CM | POA: Insufficient documentation

## 2016-01-16 ENCOUNTER — Other Ambulatory Visit: Payer: Self-pay

## 2016-01-16 ENCOUNTER — Telehealth: Payer: Self-pay | Admitting: *Deleted

## 2016-01-16 ENCOUNTER — Telehealth: Payer: Self-pay

## 2016-01-16 DIAGNOSIS — E782 Mixed hyperlipidemia: Secondary | ICD-10-CM

## 2016-01-16 DIAGNOSIS — I252 Old myocardial infarction: Secondary | ICD-10-CM

## 2016-01-16 DIAGNOSIS — D709 Neutropenia, unspecified: Secondary | ICD-10-CM | POA: Insufficient documentation

## 2016-01-16 DIAGNOSIS — Z5181 Encounter for therapeutic drug level monitoring: Secondary | ICD-10-CM

## 2016-01-16 DIAGNOSIS — D702 Other drug-induced agranulocytosis: Secondary | ICD-10-CM

## 2016-01-16 MED ORDER — PRAVASTATIN SODIUM 40 MG PO TABS: 40.0000 mg | ORAL_TABLET | Freq: Every day | ORAL | 0 refills | Status: DC

## 2016-01-16 NOTE — Telephone Encounter (Signed)
Called pt regarding his refill medication.  dosage has went up but need fasting labs. Stated to pt that he needs fasting labs. Pt didn't;t know when his next appointment was. Mention to the pt he has an appointment on the 21st at 840 am but pt ask me to move it back. I went ahead and moved it back to 11:20pm.

## 2016-01-16 NOTE — Telephone Encounter (Signed)
Ins requesting 90 day supply.  Also in labs from sept you told him to increase dose to 40mg  please write new rx

## 2016-01-16 NOTE — Assessment & Plan Note (Signed)
Check lipids 

## 2016-01-16 NOTE — Assessment & Plan Note (Signed)
Check sgpt 

## 2016-01-16 NOTE — Telephone Encounter (Signed)
Notified patient of LDCT lung cancer screening results with recommendation for 12 month follow up imaging. Also notified of incidental finding noted below and encouraged to discuss with PCP for evaluation of changes in diet or medication management. Patient verbalizes understanding. This note will be forwarded to PCP.  IMPRESSION: 1. Lung-RADS Category 2S, benign appearance or behavior. Continue annual screening with low-dose chest CT without contrast in 12 months. 2. The "S" modifier above refers to potentially clinically significant non lung cancer related findings. Specifically, three-vessel coronary atherosclerosis. 3. Aortic atherosclerosis . 4. Mild to moderate emphysema with mild diffuse bronchial wall thickening, suggesting COPD.

## 2016-01-16 NOTE — Telephone Encounter (Signed)
Thank you for seeing that I'll send in the new 40 mg but we would like him to come in for labs now please We want to make sure cholesterol is at target at this dose, but also liver enzyme okay I'll also recheck his white count now that we've stopped the remeron to see if came back up Thank you

## 2016-01-16 NOTE — Assessment & Plan Note (Signed)
Possibly due to remeron; stopped; recheck cbc

## 2016-01-16 NOTE — Telephone Encounter (Signed)
Left detailed voicemail

## 2016-01-20 DIAGNOSIS — Z87891 Personal history of nicotine dependence: Secondary | ICD-10-CM | POA: Insufficient documentation

## 2016-01-20 HISTORY — DX: Personal history of nicotine dependence: Z87.891

## 2016-01-20 NOTE — Progress Notes (Signed)
In accordance with CMS guidelines, patient has met eligibility criteria including age, absence of signs or symptoms of lung cancer.  Social History  Substance Use Topics  . Smoking status: Former Smoker    Packs/day: 1.00    Years: 30.00    Types: Cigarettes    Start date: 09/11/1984    Quit date: 11/08/2015  . Smokeless tobacco: Never Used  . Alcohol use No     Comment: may drink on "special occasions"     A shared decision-making session was conducted prior to the performance of CT scan. This includes one or more decision aids, includes benefits and harms of screening, follow-up diagnostic testing, over-diagnosis, false positive rate, and total radiation exposure.  Counseling on the importance of adherence to annual lung cancer LDCT screening, impact of co-morbidities, and ability or willingness to undergo diagnosis and treatment is imperative for compliance of the program.  Counseling on the importance of continued smoking cessation for former smokers; the importance of smoking cessation for current smokers, and information about tobacco cessation interventions have been given to patient including [place] and 1800 quit [place] programs.  Written order for lung cancer screening with LDCT has been given to the patient and any and all questions have been answered to the best of my abilities.   Yearly follow up will be coordinated by Burgess Estelle, Thoracic Navigator.

## 2016-01-29 ENCOUNTER — Ambulatory Visit: Payer: Medicare Other | Admitting: Family Medicine

## 2016-02-21 ENCOUNTER — Ambulatory Visit: Payer: Medicare Other | Admitting: Family Medicine

## 2016-03-05 ENCOUNTER — Other Ambulatory Visit: Payer: Self-pay | Admitting: Family Medicine

## 2016-03-05 DIAGNOSIS — I252 Old myocardial infarction: Secondary | ICD-10-CM

## 2016-03-05 DIAGNOSIS — E782 Mixed hyperlipidemia: Secondary | ICD-10-CM

## 2016-03-12 NOTE — Telephone Encounter (Signed)
Called Patient regarding his refill request. Left a detail  Voicemail asking the patient to come in for labs and also mention his approved refill.

## 2016-03-12 NOTE — Telephone Encounter (Signed)
Glitch in our system; I was not receiving electronic refill requests from Dec 13 until yesterday; addressed with IT; addressing now ------------------------------------ Please ask patient to have labs drawn Rx sent as requested

## 2016-03-17 ENCOUNTER — Ambulatory Visit: Payer: Medicare Other | Admitting: Family Medicine

## 2016-03-20 ENCOUNTER — Other Ambulatory Visit: Payer: Self-pay | Admitting: Family Medicine

## 2016-03-20 DIAGNOSIS — M5416 Radiculopathy, lumbar region: Secondary | ICD-10-CM | POA: Diagnosis not present

## 2016-03-20 NOTE — Telephone Encounter (Signed)
Please see if labs done

## 2016-03-21 NOTE — Telephone Encounter (Signed)
Left detailed voicemail about coming in to have labs drawn fasting before we can refill med

## 2016-04-01 ENCOUNTER — Ambulatory Visit: Payer: Medicare Other | Admitting: Family Medicine

## 2016-04-02 ENCOUNTER — Telehealth: Payer: Self-pay

## 2016-04-02 ENCOUNTER — Ambulatory Visit (INDEPENDENT_AMBULATORY_CARE_PROVIDER_SITE_OTHER): Payer: Medicare HMO | Admitting: Family Medicine

## 2016-04-02 ENCOUNTER — Encounter: Payer: Self-pay | Admitting: Family Medicine

## 2016-04-02 DIAGNOSIS — I10 Essential (primary) hypertension: Secondary | ICD-10-CM

## 2016-04-02 DIAGNOSIS — E782 Mixed hyperlipidemia: Secondary | ICD-10-CM | POA: Diagnosis not present

## 2016-04-02 DIAGNOSIS — I700 Atherosclerosis of aorta: Secondary | ICD-10-CM | POA: Diagnosis not present

## 2016-04-02 DIAGNOSIS — I2510 Atherosclerotic heart disease of native coronary artery without angina pectoris: Secondary | ICD-10-CM | POA: Diagnosis not present

## 2016-04-02 DIAGNOSIS — B20 Human immunodeficiency virus [HIV] disease: Secondary | ICD-10-CM

## 2016-04-02 DIAGNOSIS — D702 Other drug-induced agranulocytosis: Secondary | ICD-10-CM

## 2016-04-02 DIAGNOSIS — R69 Illness, unspecified: Secondary | ICD-10-CM | POA: Diagnosis not present

## 2016-04-02 DIAGNOSIS — I252 Old myocardial infarction: Secondary | ICD-10-CM

## 2016-04-02 DIAGNOSIS — Z5181 Encounter for therapeutic drug level monitoring: Secondary | ICD-10-CM

## 2016-04-02 DIAGNOSIS — Z8619 Personal history of other infectious and parasitic diseases: Secondary | ICD-10-CM

## 2016-04-02 HISTORY — DX: Atherosclerotic heart disease of native coronary artery without angina pectoris: I25.10

## 2016-04-02 LAB — CBC WITH DIFFERENTIAL/PLATELET
Basophils Absolute: 42 cells/uL (ref 0–200)
Basophils Relative: 1 %
Eosinophils Absolute: 168 cells/uL (ref 15–500)
Eosinophils Relative: 4 %
HCT: 45.2 % (ref 38.5–50.0)
Hemoglobin: 15.2 g/dL (ref 13.2–17.1)
Lymphocytes Relative: 43 %
Lymphs Abs: 1806 cells/uL (ref 850–3900)
MCH: 31.3 pg (ref 27.0–33.0)
MCHC: 33.6 g/dL (ref 32.0–36.0)
MCV: 93.2 fL (ref 80.0–100.0)
MPV: 12.7 fL — ABNORMAL HIGH (ref 7.5–12.5)
Monocytes Absolute: 504 cells/uL (ref 200–950)
Monocytes Relative: 12 %
Neutro Abs: 1680 cells/uL (ref 1500–7800)
Neutrophils Relative %: 40 %
Platelets: 218 10*3/uL (ref 140–400)
RBC: 4.85 MIL/uL (ref 4.20–5.80)
RDW: 13.8 % (ref 11.0–15.0)
WBC: 4.2 10*3/uL (ref 3.8–10.8)

## 2016-04-02 LAB — COMPLETE METABOLIC PANEL WITH GFR
ALT: 14 U/L (ref 9–46)
AST: 22 U/L (ref 10–35)
Albumin: 4 g/dL (ref 3.6–5.1)
Alkaline Phosphatase: 108 U/L (ref 40–115)
BUN: 11 mg/dL (ref 7–25)
CO2: 29 mmol/L (ref 20–31)
Calcium: 9.7 mg/dL (ref 8.6–10.3)
Chloride: 103 mmol/L (ref 98–110)
Creat: 1.15 mg/dL (ref 0.70–1.33)
GFR, Est African American: 81 mL/min (ref >=60)
GFR, Est Non African American: 70 mL/min (ref >=60)
Glucose, Bld: 91 mg/dL (ref 65–99)
Potassium: 5 mmol/L (ref 3.5–5.3)
Sodium: 138 mmol/L (ref 135–146)
Total Bilirubin: 0.4 mg/dL (ref 0.2–1.2)
Total Protein: 7.2 g/dL (ref 6.1–8.1)

## 2016-04-02 LAB — LIPID PANEL
Cholesterol: 167 mg/dL (ref <200)
HDL: 41 mg/dL (ref >40)
LDL Cholesterol: 95 mg/dL (ref <100)
Total CHOL/HDL Ratio: 4.1 Ratio (ref <5.0)
Triglycerides: 153 mg/dL — ABNORMAL HIGH (ref <150)
VLDL: 31 mg/dL — ABNORMAL HIGH (ref <30)

## 2016-04-02 MED ORDER — PRAVASTATIN SODIUM 40 MG PO TABS: 60.0000 mg | ORAL_TABLET | Freq: Every day | ORAL | 1 refills | Status: DC

## 2016-04-02 NOTE — Assessment & Plan Note (Signed)
Check CBC 

## 2016-04-02 NOTE — Progress Notes (Signed)
BP 126/70   Pulse 91   Temp 97.9 F (36.6 C) (Oral)   Resp 16   Wt 159 lb 9.6 oz (72.4 kg)   SpO2 94%   BMI 23.57 kg/m    Subjective:    Patient ID: Samuel Burnett, male    DOB: May 10, 1960, 56 y.o.   MRN: [id number]  HPI: Samuel Burnett is a 56 y.o. male  Chief Complaint  Patient presents with  . Follow-up   Here for follow-up No medical excitement since last visit Last visit was December 20, 2015 for physical  He does not want a flu shot; he is careful about germ exposure  Emphysema on CT scan; breathing not affecting quality of life he says  Coronary artery atherosclerosis and aortic atherosclerosis on CT scan; no chest pain; cannot take aspirin due to allergic reaction; he had a heart attack in 2013, [facility]; he was stressed on his job; his arm got numb at work; was told that he had a heart attack, started on the outside and worked its way in; he says the doctor had never seen anything like it; he does not currently have a cardiologist; he denies chest pain  Had natural mumps and whooping cough as a child  He is seeing someone about pain; he is going to call his provider about the cyclobenzaprine; meloxicam; having constipation; colonoscopy UTD   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 Allergy   . Controlled insomnia   . Eczema   . HIV infection ([place])   . Hyperlipidemia   . Hypertension   . Numbness in right leg    outside of right foot, related to medical device implant in spine  . Osteoarthritis of lumbar spine    also- hips  . Other forms  of scoliosis, thoracolumbar region   . Restless leg syndrome   . Syphilis    Past Surgical History:  Procedure Laterality Date  . COLONOSCOPY WITH PROPOFOL N/A 12/01/2014   Procedure: COLONOSCOPY WITH PROPOFOL;  Surgeon: [name], MD;  Location: [place];  Service: Endoscopy;  Laterality: N/A;  . EYE SURGERY  age 27   uncross eyes  . POLYPECTOMY  12/01/2014   Procedure: POLYPECTOMY;  Surgeon: [name], MD;  Location: [place];  Service: Endoscopy;;  . SPINE SURGERY  01/21/12   implanted medtronics SCS   Family History  Problem Relation Age of Onset  . Lupus Mother   . Cancer Father     Pancreatis  . Diabetes Sister   . Diabetes Brother    Social History  Substance Use Topics  . Smoking status: Former Smoker    Packs/day: 1.00    Years: 30.00    Types: Cigarettes    Start date: 09/11/1984    Quit date: 11/08/2015  . Smokeless tobacco: Never Used  . Alcohol use No     Comment: may drink on "special occasions"   Interim medical history since last visit reviewed. Allergies and medications reviewed  Review of Systems Per HPI unless specifically  indicated above     Objective:    BP 126/70   Pulse 91   Temp 97.9 F (36.6 C) (Oral)   Resp 16   Wt 159 lb 9.6 oz (72.4 kg)   SpO2 94%   BMI 23.57 kg/m   Wt Readings from Last 3 Encounters:  04/02/16 159 lb 9.6 oz (72.4 kg)  01/15/16 158 lb (71.7 kg)  12/20/15 157 lb (71.2 kg)    Physical Exam  Constitutional: He appears well-developed and well-nourished. No distress.  HENT:  Head: Normocephalic and atraumatic.  Eyes: EOM are normal. No scleral icterus.  Neck: No thyromegaly present.  Cardiovascular: Normal rate and regular rhythm.   Pulmonary/Chest: Effort normal and breath sounds normal.  Abdominal: Soft. Bowel sounds are normal. He exhibits no distension.  Musculoskeletal: He exhibits no edema.  Neurological: Coordination normal.  Skin: Skin is warm and dry. No rash noted. No pallor.    Psychiatric: He has a normal mood and affect. His behavior is normal. Judgment and thought content normal.      Assessment & Plan:   Problem List Items Addressed This Visit      Cardiovascular and Mediastinum   Hypertension goal BP (blood pressure) < 140/90    Well-controlled; try to follow DASH guidelines      Relevant Medications   pravastatin (PRAVACHOL) 40 MG tablet   CAD (coronary artery disease)    Hx of heart attack about 5 years ago; cannot take aspirin due to allergy; aggressive lipid lowering; refer to cardiologist to see if stress testing needed, given that last MI was five years ago      Relevant Medications   pravastatin (PRAVACHOL) 40 MG tablet   Other Relevant Orders   Lipid panel (Completed)   Ambulatory referral to Cardiology   Aortic atherosclerosis (HCC)    Goal LDL less than 70; aggressive lipid lowering      Relevant Medications   pravastatin (PRAVACHOL) 40 MG tablet   Other Relevant Orders   Lipid panel (Completed)   Ambulatory referral to Cardiology     Other   Neutropenia ([place])    Check CBC      Hx of myocardial infarction    Goal LDL of less than 70; cannot take aspirin; refer to cardiologist      Relevant Medications   pravastatin (PRAVACHOL) 40 MG tablet   Other Relevant Orders   Lipid panel (Completed)   Ambulatory referral to Cardiology   HLD (hyperlipidemia)    Check lipids, goal LDL less than 70; limit saturated fats      Relevant Medications   pravastatin (PRAVACHOL) 40 MG tablet   Other Relevant Orders   Lipid panel (Completed)   HIV (human immunodeficiency virus infection) ([place])    Managed by Dr. Radonna Ricker at [facility]      History of syphilis    Encouraged patient to continue to f/u with ID specialist about his concerns      Encounter for medication monitoring    Check labs      Relevant Orders   CBC with Differential/Platelet (Completed)   COMPLETE METABOLIC PANEL WITH GFR (Completed)      Follow up  plan: Return in about 6 months (around 09/30/2016) for follow-up.  An after-visit summary was printed and given to the patient at [facility].  Please see the patient instructions which may contain other information and recommendations beyond what is mentioned above in the assessment and plan.  Meds ordered this encounter  Medications  . cyclobenzaprine (  FLEXERIL) 10 MG tablet    Sig: Take 10 mg by mouth 3 (three) times daily as needed.  . pravastatin (PRAVACHOL) 40 MG tablet    Sig: Take 1.5 tablets (60 mg total) by mouth at bedtime.    Dispense:  135 tablet    Refill:  1    Increasing dose just a little more    Orders Placed This Encounter  Procedures  . Lipid panel  . CBC with Differential/Platelet  . COMPLETE METABOLIC PANEL WITH GFR  . Ambulatory referral to Cardiology

## 2016-04-02 NOTE — Assessment & Plan Note (Signed)
Hx of heart attack about 5 years ago; cannot take aspirin due to allergy; aggressive lipid lowering; refer to cardiologist to see if stress testing needed, given that last MI was five years ago

## 2016-04-02 NOTE — Assessment & Plan Note (Signed)
Check lipids, goal LDL less than 70; limit saturated fats

## 2016-04-02 NOTE — Assessment & Plan Note (Signed)
Well-controlled; try to follow DASH guidelines 

## 2016-04-02 NOTE — Telephone Encounter (Signed)
Pt seen today for a follow up and he was concern about possible having syphilis. Since he has an history of it. Talked to Dr. lada about it and she wanted me to call his ID doctor ( dr.  Radonna Ricker) and spoke with his nurse to figure out what test we need to run.  His nurse mention to me he has not seen him since November 24 2014 and she would prefer that he talk to Dr. lada about it. Gave the nurse our office number to have him call and talk to Dr. Sanda Klein.

## 2016-04-02 NOTE — Assessment & Plan Note (Signed)
Check labs 

## 2016-04-02 NOTE — Assessment & Plan Note (Signed)
Managed by Dr. Radonna Ricker at [facility]

## 2016-04-02 NOTE — Assessment & Plan Note (Signed)
Encouraged patient to continue to f/u with ID specialist about his concerns

## 2016-04-02 NOTE — Assessment & Plan Note (Signed)
Goal LDL of less than 70; cannot take aspirin; refer to cardiologist

## 2016-04-02 NOTE — Patient Instructions (Signed)
Try to limit saturated fats in your diet (bologna, hot dogs, barbeque, cheeseburgers, hamburgers, steak, bacon, sausage, cheese, etc.) and get more fresh fruits, vegetables, and whole grains  Let's get labs today I've referred you to a cardiologist If you have not heard anything from my staff in a week about any orders/referrals/studies from today, please contact us here to follow-up [phone]

## 2016-04-02 NOTE — Assessment & Plan Note (Signed)
Goal LDL less than 70; aggressive lipid lowering

## 2016-04-11 ENCOUNTER — Other Ambulatory Visit: Payer: Self-pay | Admitting: Family Medicine

## 2016-04-11 DIAGNOSIS — I252 Old myocardial infarction: Secondary | ICD-10-CM

## 2016-04-11 DIAGNOSIS — E782 Mixed hyperlipidemia: Secondary | ICD-10-CM

## 2016-04-11 MED ORDER — PRAVASTATIN SODIUM 40 MG PO TABS: 60.0000 mg | ORAL_TABLET | Freq: Every day | ORAL | 1 refills | Status: DC

## 2016-04-11 MED ORDER — DULOXETINE HCL 60 MG PO CPEP: 60.0000 mg | ORAL_CAPSULE | Freq: Every day | ORAL | 1 refills | Status: DC

## 2016-04-11 NOTE — Telephone Encounter (Signed)
Pt no longer uses [facility] and is asking that you please send pravastatin and duloxetine to [facility] order. There (P) [id number] (F) [phone]

## 2016-04-11 NOTE — Telephone Encounter (Signed)
Rxs sent

## 2016-04-16 ENCOUNTER — Ambulatory Visit: Payer: Medicare Other | Admitting: Internal Medicine

## 2016-04-17 ENCOUNTER — Encounter: Payer: Self-pay | Admitting: Cardiology

## 2016-04-17 ENCOUNTER — Ambulatory Visit (INDEPENDENT_AMBULATORY_CARE_PROVIDER_SITE_OTHER): Payer: Medicare HMO | Admitting: Cardiology

## 2016-04-17 VITALS — BP 100/80 | HR 80 | Ht 70.0 in | Wt 161.0 lb

## 2016-04-17 DIAGNOSIS — I7090 Unspecified atherosclerosis: Secondary | ICD-10-CM

## 2016-04-17 DIAGNOSIS — E7849 Other hyperlipidemia: Secondary | ICD-10-CM

## 2016-04-17 DIAGNOSIS — R079 Chest pain, unspecified: Secondary | ICD-10-CM

## 2016-04-17 DIAGNOSIS — I10 Essential (primary) hypertension: Secondary | ICD-10-CM

## 2016-04-17 DIAGNOSIS — M545 Low back pain: Secondary | ICD-10-CM | POA: Diagnosis not present

## 2016-04-17 DIAGNOSIS — M5416 Radiculopathy, lumbar region: Secondary | ICD-10-CM | POA: Diagnosis not present

## 2016-04-17 DIAGNOSIS — E784 Other hyperlipidemia: Secondary | ICD-10-CM

## 2016-04-17 NOTE — Progress Notes (Signed)
Cardiology Office Note   Date:  04/17/2016   ID:  Samuel Burnett, DOB 12/14/1960, MRN [id number]  Referring Doctor:  Enid Derry, MD   Cardiologist:   Wende Bushy, MD   Reason for consultation:  Chief Complaint  Patient presents with  . other    C/o chest discomfort feels like "air". Meds reviewed verbally with pt.      History of Present Illness: Samuel Burnett is a 56 y.o. male who presents for Chest pain. Patient reports that he has a history of " heart attack" many years ago. He does not recall getting a cardiac catheterization done.  He had a CT scan of his lungs as cancer screening. Incidentally, that showed three-vessel coronary atherosclerosis.  Patient mentions no shortness of breath but he does have very limited walking due to having an neurostimulator, likely for question of scoliosis.  He does have some chest pains, mild to moderate intensity, lasting minutes at a time, randomly occurring, nonradiating.  No PND, orthopnea, edema. No abdominal pain.   ROS:  Please see the history of present illness. Aside from mentioned under HPI, all other systems are reviewed and negative.     Past Medical History:  Diagnosis Date  . Allergy   . Controlled insomnia   . Eczema   . HIV infection ([place])   . Hyperlipidemia   . Hypertension   . Numbness in right leg    outside of right foot, related to medical device implant in spine  . Osteoarthritis of lumbar spine    also- hips  . Other forms of scoliosis, thoracolumbar region   . Restless leg syndrome   . Syphilis     Past Surgical History:  Procedure Laterality Date  . COLONOSCOPY WITH PROPOFOL N/A 12/01/2014   Procedure: COLONOSCOPY WITH PROPOFOL;  Surgeon: [name], MD;  Location: [facility];  Service: Endoscopy;  Laterality: N/A;  . EYE SURGERY  age 43   uncross eyes  . POLYPECTOMY  12/01/2014   Procedure: POLYPECTOMY;  Surgeon: [name], MD;  Location: [facility];  Service:  Endoscopy;;  . SPINAL CORD STIMULATOR IMPLANT    . SPINE SURGERY  01/21/12   implanted medtronics SCS     reports that he quit smoking about 5 months ago. His smoking use included Cigarettes. He started smoking about 31 years ago. He has a 30.00 pack-year smoking history. He has never used smokeless tobacco. He reports that he drinks alcohol. He reports that he does not use drugs.   family history includes Cancer in his father; Diabetes in his brother and sister; Lupus in his mother.   Outpatient Medications Prior to Visit  Medication Sig Dispense Refill  . amLODipine (NORVASC) 5 MG tablet Take 1 tablet (5 mg total) by mouth daily. 90 tablet 3  . buPROPion (WELLBUTRIN SR) 150 MG 12 hr tablet TAKE 1 TABLET (150 MG TOTAL) BY MOUTH 2 (TWO) TIMES DAILY. 60 tablet 3  . DULoxetine (CYMBALTA) 60 MG capsule Take 1 capsule (60 mg total) by mouth daily. 90 capsule 1  . gabapentin (NEURONTIN) 400 MG capsule Take 1 capsule (400 mg total) by mouth 3 (three) times daily. 270 capsule 1  . metroNIDAZOLE (METROGEL) 1 % gel Apply topically daily. 45 g 5  . pravastatin (PRAVACHOL) 40 MG tablet Take 1.5 tablets (60 mg total) by mouth at bedtime. 135 tablet 1  . rOPINIRole (REQUIP) 1 MG tablet Take 1 tablet (1 mg total) by mouth at bedtime. 90 tablet 3  .  traZODone (DESYREL) 50 MG tablet Take 0.5-1 tablets (25-50 mg total) by mouth at bedtime as needed for sleep. 30 tablet 3  . cyclobenzaprine (FLEXERIL) 10 MG tablet Take 10 mg by mouth 3 (three) times daily as needed.    . meloxicam (MOBIC) 15 MG tablet TAKE 1 TABLET ORALLY ONCE A DAY (Patient not taking: Reported on 04/17/2016) 30 tablet 1  . mirtazapine (REMERON) 30 MG tablet      No facility-administered medications prior to visit.      Allergies: Fentanyl; Shellfish allergy; Tomato; Aspirin; Dust mite extract; Penicillin g; and Pollen extract    PHYSICAL EXAM: VS:  BP 100/80 (BP Location: Right Arm, Patient Position: Sitting, Cuff Size: Normal)   Pulse  80   Ht 5\' 10"  (1.778 m)   Wt 161 lb (73 kg)   BMI 23.10 kg/m  , Body mass index is 23.1 kg/m. Wt Readings from Last 3 Encounters:  04/17/16 161 lb (73 kg)  04/02/16 159 lb 9.6 oz (72.4 kg)  01/15/16 158 lb (71.7 kg)    GENERAL:  well developed, well nourished, , not in acute distress HEENT: normocephalic, pink conjunctivae, anicteric sclerae, no xanthelasma, normal dentition, oropharynx clear NECK:  no neck vein engorgement, JVP normal, no hepatojugular reflux, carotid upstroke brisk and symmetric, no bruit, no thyromegaly, no lymphadenopathy LUNGS:  good respiratory effort, clear to auscultation bilaterally CV:  PMI not displaced, no thrills, no lifts, S1 and S2 within normal limits, no palpable S3 or S4, no murmurs, no rubs, no gallops ABD:  Soft, nontender, nondistended, normoactive bowel sounds, no abdominal aortic bruit, no hepatomegaly, no splenomegaly MS: nontender back, no kyphosis, no scoliosis, no joint deformities EXT:  2+ DP/PT pulses, no edema, no varicosities, no cyanosis, no clubbing SKIN: warm, nondiaphoretic, normal turgor, no ulcers NEUROPSYCH: alert, oriented to person, place, and time, sensory/motor grossly intact, normal mood, appropriate affect  Recent Labs: 11/19/2015: TSH 2.08 04/02/2016: ALT 14; BUN 11; Creat 1.15; Hemoglobin 15.2; Platelets 218; Potassium 5.0; Sodium 138   Lipid Panel    Component Value Date/Time   CHOL 167 04/02/2016 1150   CHOL 163 11/15/2014 1317   TRIG 153 (H) 04/02/2016 1150   HDL 41 04/02/2016 1150   HDL 47 11/15/2014 1317   CHOLHDL 4.1 04/02/2016 1150   VLDL 31 (H) 04/02/2016 1150   LDLCALC 95 04/02/2016 1150   LDLCALC 103 (H) 11/15/2014 1317     Other studies Reviewed:  EKG:  The ekg from 04/17/2016 was personally reviewed by me and it revealed sinus rhythm, 80 BPM.  Additional studies/ records that were reviewed personally reviewed by me today include: None available   ASSESSMENT AND PLAN: Coronary atherosclerosis  noted on CT Atypical chest pain. Risk factors for CAD include hyperlipidemia, hypertension, smoking history Recommend echocardiogram and pharmacologic nuclear stress test. If no contraindication, she try Plavix 75 mg by mouth daily. Patient reports allergies to to aspirin. Risk factor modification including blood pressure control, LDL goal should be less than 70.  Hypertension BP is well controlled. Continue monitoring BP. Continue current medical therapy and lifestyle changes.  Hyperlipidemia LDL goal should be less than 70. PCP following labs.    Current medicines are reviewed at length with the patient today.  The patient does not have concerns regarding medicines.  Labs/ tests ordered today include:  Orders Placed This Encounter  Procedures  . NM Myocar Multi W/Spect W/Wall Motion / EF  . EKG 12-Lead  . ECHOCARDIOGRAM COMPLETE    I had a  lengthy and detailed discussion with the patient regarding diagnoses, prognosis, diagnostic options, treatment options , and side effects of medications.   I counseled the patient on importance of lifestyle modification including heart healthy diet, regular physical activity .   Disposition:   FU with undersigned after tests   Thank you for this consultation. We will forwarding this consultation to referring physician.   Signed, Wende Bushy, MD  04/17/2016 1:01 PM    [place]  This note was generated in part with voice recognition software and I apologize for any typographical errors that were not detected and corrected.

## 2016-04-17 NOTE — Patient Instructions (Addendum)
Testing/Procedures: Your physician has requested that you have an echocardiogram. Echocardiography is a painless test that uses sound waves to create images of your heart. It provides your doctor with information about the size and shape of your heart and how well your heart's chambers and valves are working. This procedure takes approximately one hour. There are no restrictions for this procedure.  [place]  Your caregiver has ordered a Stress Test with nuclear imaging. The purpose of this test is to evaluate the blood supply to your heart muscle. This procedure is referred to as a "Non-Invasive Stress Test." This is because other than having an IV started in your vein, nothing is inserted or "invades" your body. Cardiac stress tests are done to find areas of poor blood flow to the heart by determining the extent of coronary artery disease (CAD). Some patients exercise on a treadmill, which naturally increases the blood flow to your heart, while others who are  unable to walk on a treadmill due to physical limitations have a pharmacologic/chemical stress agent called Lexiscan . This medicine will mimic walking on a treadmill by temporarily increasing your coronary blood flow.   Please note: these test may take anywhere between 2-4 hours to complete  PLEASE REPORT TO [place] AT THE FIRST DESK WILL DIRECT YOU WHERE TO GO  Date of Procedure:_Thursday May 01, 2016 at 09:00AM__  Arrival Time for Procedure:_Arrive at 08:45AM to register_   PLEASE NOTIFY THE OFFICE AT LEAST 24 HOURS IN ADVANCE IF YOU ARE UNABLE TO [place].  [phone] AND  PLEASE NOTIFY NUCLEAR MEDICINE AT [facility] AT LEAST 24 HOURS IN ADVANCE IF YOU ARE UNABLE TO KEEP YOUR APPOINTMENT. [phone]  How to prepare for your Myoview test:  1. Do not eat or drink after midnight 2. No caffeine for 24 hours prior to test 3. No smoking 24 hours prior to test. 4. Your medication  may be taken with water.  If your doctor stopped a medication because of this test, do not take that medication. 5. Ladies, please do not wear dresses.  Skirts or pants are appropriate. Please wear a short sleeve shirt. 6. No perfume, cologne or lotion. 7. Wear comfortable walking shoes. No heels!   Follow-Up: Your physician recommends that you schedule a follow-up appointment: 3 months with Dr. Yvone Neu.   It was a pleasure seeing you today here in the office. Please do not hesitate to give [place] a call back if you have any further questions. P[place], BSN   Echocardiogram An echocardiogram, or echocardiography, uses sound waves (ultrasound) to produce an image of your heart. The echocardiogram is simple, painless, obtained within a short period of time, and offers valuable information to your health care provider. The images from an echocardiogram can provide information such as:  Evidence of coronary artery disease (CAD).  Heart size.  Heart muscle function.  Heart valve function.  Aneurysm detection.  Evidence of a past heart attack.  Fluid buildup around the heart.  Heart muscle thickening.  Assess heart valve function. Tell a health care provider about:  Any allergies you have.  All medicines you are taking, including vitamins, herbs, eye drops, creams, and over-the-counter medicines.  Any problems you or family members have had with anesthetic medicines.  Any blood disorders you have.  Any surgeries you have had.  Any medical conditions you have.  Whether you are pregnant or may be pregnant. What happens before the procedure?  No special preparation is needed. Eat and drink normally. What happens during the procedure?  In order to produce an image of your heart, gel will be applied to your chest and a wand-like tool (transducer) will be moved over your chest. The gel will help transmit the sound waves from the transducer. The sound waves will  harmlessly bounce off your heart to allow the heart images to be captured in real-time motion. These images will then be recorded.  You may need an IV to receive a medicine that improves the quality of the pictures. What happens after the procedure? You may return to your normal schedule including diet, activities, and medicines, unless your health care provider tells you otherwise. This information is not intended to replace advice given to you by your health care provider. Make sure you discuss any questions you have with your health care provider. Document Released: 02/22/2000 Document Revised: 10/13/2015 Document Reviewed: 11/01/2012 Elsevier Interactive Patient Education  2017 [place]. Pharmacologic Stress Electrocardiogram A pharmacologic stress electrocardiogram is a heart (cardiac) test that uses nuclear imaging to evaluate the blood supply to your heart. This test may also be called a pharmacologic stress electrocardiography. Pharmacologic means that a medicine is used to increase your heart rate and blood pressure.  This stress test is done to find areas of poor blood flow to the heart by determining the extent of coronary artery disease (CAD). Some people exercise on a treadmill, which naturally increases the blood flow to the heart. For those people unable to exercise on a treadmill, a medicine is used. This medicine stimulates your heart and will cause your heart to beat harder and more quickly, as if you were exercising.  Pharmacologic stress tests can help determine:  The adequacy of blood flow to your heart during increased levels of activity in order to clear you for discharge home.  The extent of coronary artery blockage caused by CAD.  Your prognosis if you have suffered a heart attack.  The effectiveness of cardiac procedures done, such as an angioplasty, which can increase the circulation in your coronary arteries.  Causes of chest pain or pressure. LET [facility]  CARE PROVIDER KNOW ABOUT:  Any allergies you have.  All medicines you are taking, including vitamins, herbs, eye drops, creams, and over-the-counter medicines.  Previous problems you or members of your family have had with the use of anesthetics.  Any blood disorders you have.  Previous surgeries you have had.  Medical conditions you have.  Possibility of pregnancy, if this applies.  If you are currently breastfeeding. RISKS AND COMPLICATIONS Generally, this is a safe procedure. However, as with any procedure, complications can occur. Possible complications include:  You develop pain or pressure in the following areas:  Chest.  Jaw or neck.  Between your shoulder blades.  Radiating down your left arm.  Headache.  Dizziness or light-headedness.  Shortness of breath.  Increased or irregular heartbeat.  Low blood pressure.  Nausea or vomiting.  Flushing.  Redness going up the arm and slight pain during injection of medicine.  Heart attack (rare). BEFORE THE PROCEDURE   Avoid all forms of caffeine for 24 hours before your test or as directed by your health care provider. This includes coffee, tea (even decaffeinated tea), caffeinated sodas, chocolate, cocoa, and certain pain medicines.  Follow your health care provider's instructions regarding eating and drinking before the test.  Take your medicines as directed at regular times with water unless instructed otherwise. Exceptions may include:  If you have diabetes, ask how you are to take your insulin or pills. It is common to adjust insulin dosing the morning of the test.  If you are taking beta-blocker medicines, it is important to talk to your health care provider about these medicines well before the date of your test. Taking beta-blocker medicines may interfere with the test. In some cases, these medicines need to be changed or stopped 24 hours or more before the test.  If you wear a nitroglycerin patch, it  may need to be removed prior to the test. Ask your health care provider if the patch should be removed before the test.  If you use an inhaler for any breathing condition, bring it with you to the test.  If you are an outpatient, bring a snack so you can eat right after the stress phase of the test.  Do not smoke for 4 hours prior to the test or as directed by your health care provider.  Do not apply lotions, powders, creams, or oils on your chest prior to the test.  Wear comfortable shoes and clothing. Let your health care provider know if you were unable to complete or follow the preparations for your test. PROCEDURE   Multiple patches (electrodes) will be put on your chest. If needed, small areas of your chest may be shaved to get better contact with the electrodes. Once the electrodes are attached to your body, multiple wires will be attached to the electrodes, and your heart rate will be monitored.  An IV access will be started. A nuclear trace (isotope) is given. The isotope may be given intravenously, or it may be swallowed. Nuclear refers to several types of radioactive isotopes, and the nuclear isotope lights up the arteries so that the nuclear images are clear. The isotope is absorbed by your body. This results in low radiation exposure.  A resting nuclear image is taken to show how your heart functions at rest.  A medicine is given through the IV access.  A second scan is done about 1 hour after the medicine injection and determines how your heart functions under stress.  During this stress phase, you will be connected to an electrocardiogram machine. Your blood pressure and oxygen levels will be monitored. AFTER THE PROCEDURE   Your heart rate and blood pressure will be monitored after the test.  You may return to your normal schedule, including diet,activities, and medicines, unless your health care provider tells you otherwise. This information is not intended to replace  advice given to you by your health care provider. Make sure you discuss any questions you have with your health care provider. Document Released: 07/13/2008 Document Revised: 03/01/2013 Document Reviewed: 11/01/2012 Elsevier Interactive Patient Education  2017 [employer].

## 2016-05-01 ENCOUNTER — Encounter
Admission: RE | Admit: 2016-05-01 | Discharge: 2016-05-01 | Disposition: A | Discharge status: Home / Self Care | Payer: Medicare HMO | Source: Ambulatory Visit | Attending: Cardiology | Admitting: Cardiology

## 2016-05-01 DIAGNOSIS — R079 Chest pain, unspecified: Secondary | ICD-10-CM | POA: Diagnosis not present

## 2016-05-01 DIAGNOSIS — I7090 Unspecified atherosclerosis: Secondary | ICD-10-CM | POA: Insufficient documentation

## 2016-05-01 LAB — NM MYOCAR MULTI W/SPECT W/WALL MOTION / EF
LV dias vol: 112 mL (ref 62–150)
LV sys vol: 65 mL
Peak HR: 85 {beats}/min
Percent HR: 52 %
Rest HR: 72 {beats}/min
SDS: 1
SRS: 3
SSS: 5
TID: 1.17

## 2016-05-01 MED ORDER — TECHNETIUM TC 99M TETROFOSMIN IV KIT
13.0000 | PACK | Freq: Once | INTRAVENOUS | Status: AC | PRN
  Administered 2016-05-01: 12.875 via INTRAVENOUS

## 2016-05-01 MED ORDER — REGADENOSON 0.4 MG/5ML IV SOLN
0.4000 mg | Freq: Once | INTRAVENOUS | Status: AC
  Administered 2016-05-01: 0.4 mg via INTRAVENOUS
  Filled 2016-05-01: qty 5

## 2016-05-01 MED ORDER — TECHNETIUM TC 99M TETROFOSMIN IV KIT
30.0000 | PACK | Freq: Once | INTRAVENOUS | Status: AC | PRN
  Administered 2016-05-01: 32.015 via INTRAVENOUS

## 2016-05-01 MED ORDER — REGADENOSON 0.4 MG/5ML IV SOLN
0.4000 mg | Freq: Once | INTRAVENOUS | Status: DC
  Filled 2016-05-01: qty 5

## 2016-05-02 ENCOUNTER — Other Ambulatory Visit: Payer: Self-pay | Admitting: Family Medicine

## 2016-05-02 DIAGNOSIS — I252 Old myocardial infarction: Secondary | ICD-10-CM

## 2016-05-02 DIAGNOSIS — E782 Mixed hyperlipidemia: Secondary | ICD-10-CM

## 2016-05-02 NOTE — Telephone Encounter (Signed)
Patient called asking for a refill on his pravastatin. Called the patient and asked the pt couple of question since he had 135 tablet and 1 refill sent to [facility] home delivery. He stated he was not able to get the Rx filled and he wasn't for sure why. I called [employer] and spoke with a representative and she stated because of the Qty amount of the Rx it will need to have a prior authorization.  They however can mail out a 30 day supply if he call them to confirmed. Representative gave me the number to call for prior Auth. Dr. lada what would you like to do?

## 2016-05-02 NOTE — Telephone Encounter (Signed)
Pt requesting refill for pravastatin 60mg , asking that you send to [place] mail order pharmacy. Pt is completely out for 2 days. Have a few of the 30mg  left and is taking that to make up the 60mg  however he have about 4 pills left.

## 2016-05-02 NOTE — Telephone Encounter (Signed)
I'll be glad to get prior authorization for 135 pills; if you'll get them on the phone for me, I'll talk to their staff; thank you

## 2016-05-03 MED ORDER — PRAVASTATIN SODIUM 40 MG PO TABS: 60.0000 mg | ORAL_TABLET | Freq: Every day | ORAL | 1 refills | Status: DC

## 2016-05-05 NOTE — Telephone Encounter (Signed)
Talk with brianna with [facility] prior [facility] department. Rx has been approved for him to take 1.5 tablets (60 mg total) by mouth daily with QTY of 135. PA # is [id number]. She faxed over confirmation. I will call Pt to confirmed. He will also get a letter in the mail confirm the Rx has been approved.

## 2016-05-07 ENCOUNTER — Other Ambulatory Visit: Payer: Self-pay | Admitting: Family Medicine

## 2016-05-07 DIAGNOSIS — I700 Atherosclerosis of aorta: Secondary | ICD-10-CM

## 2016-05-07 DIAGNOSIS — I2510 Atherosclerotic heart disease of native coronary artery without angina pectoris: Secondary | ICD-10-CM

## 2016-05-07 DIAGNOSIS — Z5181 Encounter for therapeutic drug level monitoring: Secondary | ICD-10-CM

## 2016-05-07 NOTE — Progress Notes (Signed)
New orders for labs, due early march

## 2016-05-13 ENCOUNTER — Ambulatory Visit (INDEPENDENT_AMBULATORY_CARE_PROVIDER_SITE_OTHER): Payer: Medicare HMO

## 2016-05-13 ENCOUNTER — Other Ambulatory Visit: Payer: Self-pay

## 2016-05-13 ENCOUNTER — Other Ambulatory Visit: Payer: Self-pay | Admitting: Family Medicine

## 2016-05-13 DIAGNOSIS — I7090 Unspecified atherosclerosis: Secondary | ICD-10-CM | POA: Diagnosis not present

## 2016-05-13 DIAGNOSIS — R079 Chest pain, unspecified: Secondary | ICD-10-CM

## 2016-05-13 NOTE — Telephone Encounter (Signed)
Trazodone approved

## 2016-05-20 ENCOUNTER — Telehealth: Payer: Self-pay | Admitting: *Deleted

## 2016-05-20 ENCOUNTER — Telehealth: Payer: Self-pay | Admitting: Family Medicine

## 2016-05-20 DIAGNOSIS — I77819 Aortic ectasia, unspecified site: Secondary | ICD-10-CM

## 2016-05-20 NOTE — Telephone Encounter (Signed)
Patient returning call from Friday vm re: Myoview results. Please call.

## 2016-05-20 NOTE — Telephone Encounter (Signed)
Reviewed results and recommendations with patient and provided him with number to call in order to get it scheduled. Let him know that I would call results of cta when they are available and we will keep his current appointment in April. He verbalized understanding of our conversation, agreement with plan, and had no further questions at this time.

## 2016-05-20 NOTE — Telephone Encounter (Signed)
-----   Message from Wende Bushy, MD sent at 05/15/2016  6:23 PM EST ----- LVEF okay. Aortic root appears enlarged at 40 mm. Recommend further evaluation with CTA chest

## 2016-05-20 NOTE — Telephone Encounter (Signed)
Duplicate phone note.

## 2016-05-22 DIAGNOSIS — Z79891 Long term (current) use of opiate analgesic: Secondary | ICD-10-CM | POA: Diagnosis not present

## 2016-05-22 DIAGNOSIS — M5416 Radiculopathy, lumbar region: Secondary | ICD-10-CM | POA: Diagnosis not present

## 2016-05-30 ENCOUNTER — Ambulatory Visit: Admission: RE | Admit: 2016-05-30 | Payer: Medicare HMO | Source: Ambulatory Visit

## 2016-06-02 ENCOUNTER — Other Ambulatory Visit: Payer: Self-pay

## 2016-06-02 ENCOUNTER — Ambulatory Visit
Admission: RE | Admit: 2016-06-02 | Discharge: 2016-06-02 | Disposition: A | Discharge status: Home / Self Care | Payer: Medicare HMO | Source: Ambulatory Visit | Attending: Cardiology | Admitting: Cardiology

## 2016-06-02 DIAGNOSIS — I2510 Atherosclerotic heart disease of native coronary artery without angina pectoris: Secondary | ICD-10-CM | POA: Diagnosis not present

## 2016-06-02 DIAGNOSIS — Z5181 Encounter for therapeutic drug level monitoring: Secondary | ICD-10-CM

## 2016-06-02 DIAGNOSIS — J439 Emphysema, unspecified: Secondary | ICD-10-CM | POA: Insufficient documentation

## 2016-06-02 DIAGNOSIS — I700 Atherosclerosis of aorta: Secondary | ICD-10-CM

## 2016-06-02 DIAGNOSIS — I77819 Aortic ectasia, unspecified site: Secondary | ICD-10-CM

## 2016-06-02 DIAGNOSIS — R079 Chest pain, unspecified: Secondary | ICD-10-CM | POA: Diagnosis not present

## 2016-06-02 DIAGNOSIS — M5416 Radiculopathy, lumbar region: Secondary | ICD-10-CM | POA: Diagnosis not present

## 2016-06-02 LAB — LIPID PANEL
Cholesterol: 142 mg/dL (ref <200)
HDL: 35 mg/dL — ABNORMAL LOW (ref >40)
LDL Cholesterol: 77 mg/dL (ref <100)
Total CHOL/HDL Ratio: 4.1 Ratio (ref <5.0)
Triglycerides: 148 mg/dL (ref <150)
VLDL: 30 mg/dL (ref <30)

## 2016-06-02 LAB — ALT: ALT: 14 U/L (ref 9–46)

## 2016-06-02 MED ORDER — IOPAMIDOL (ISOVUE-370) INJECTION 76%: 100.0000 mL | Freq: Once | INTRAVENOUS | Status: DC | PRN

## 2016-06-06 ENCOUNTER — Telehealth: Payer: Self-pay | Admitting: *Deleted

## 2016-06-06 DIAGNOSIS — I700 Atherosclerosis of aorta: Secondary | ICD-10-CM

## 2016-06-06 NOTE — Telephone Encounter (Signed)
Reviewed recommendations with patient and placed order for repeat echocardiogram in 6 months. He verbalized understanding with no further questions at this time.

## 2016-06-06 NOTE — Telephone Encounter (Signed)
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 Rec rpt echo (since only ao root is dilated) in 6 mo time. May consider rpt CTA in 1 yr. Pls fwd to PCP re other findings.

## 2016-07-03 ENCOUNTER — Ambulatory Visit (INDEPENDENT_AMBULATORY_CARE_PROVIDER_SITE_OTHER): Payer: Medicare HMO | Admitting: Cardiology

## 2016-07-03 ENCOUNTER — Encounter: Payer: Self-pay | Admitting: Cardiology

## 2016-07-03 ENCOUNTER — Telehealth: Payer: Self-pay | Admitting: Cardiology

## 2016-07-03 VITALS — BP 96/62 | HR 72 | Ht 70.0 in | Wt 156.5 lb

## 2016-07-03 DIAGNOSIS — I7090 Unspecified atherosclerosis: Secondary | ICD-10-CM | POA: Diagnosis not present

## 2016-07-03 DIAGNOSIS — I77810 Thoracic aortic ectasia: Secondary | ICD-10-CM | POA: Diagnosis not present

## 2016-07-03 DIAGNOSIS — I10 Essential (primary) hypertension: Secondary | ICD-10-CM | POA: Diagnosis not present

## 2016-07-03 DIAGNOSIS — E784 Other hyperlipidemia: Secondary | ICD-10-CM

## 2016-07-03 DIAGNOSIS — E7849 Other hyperlipidemia: Secondary | ICD-10-CM

## 2016-07-03 MED ORDER — BLOOD PRESSURE MONITOR KIT: 1.0000 | PACK | 0 refills | Status: AC

## 2016-07-03 NOTE — Telephone Encounter (Signed)
Left voicemail message for patient to call back and that blood pressure cuff may not be covered and he should check with his insurance.

## 2016-07-03 NOTE — Telephone Encounter (Signed)
Prescription sent over to [facility] for blood pressure machine.

## 2016-07-03 NOTE — Progress Notes (Signed)
Cardiology Office Note   Date:  07/03/2016   ID:  Samuel Burnett, DOB 1960/06/04, MRN [id number]  Referring Doctor:  Enid Derry, MD   Cardiologist:   Wende Bushy, MD   Reason for consultation:  Chief Complaint  Patient presents with  . other    F/U testing.  Pt c/o fluttering.  Meds verbally reviewed with patient.        History of Present Illness: Samuel Burnett is a 56 y.o. male who presents for Follow-up after testing  Review of records show: He had a CT scan of his lungs as cancer screening. Incidentally, that showed three-vessel coronary atherosclerosis.  Pt has been doing well. Denies CP, SOB. No PND, orthopnea, edema. One episode of fluttering that went away spontaneously. No recurrence since then. Was a short duration.  ROS:  Please see the history of present illness. Aside from mentioned under HPI, all other systems are reviewed and negative.    Past Medical History:  Diagnosis Date  . Allergy   . Controlled insomnia   . Eczema   . HIV infection ([place])   . Hyperlipidemia   . Hypertension   . Numbness in right leg    outside of right foot, related to medical device implant in spine  . Osteoarthritis of lumbar spine    also- hips  . Other forms of scoliosis, thoracolumbar region   . Restless leg syndrome   . Syphilis     Past Surgical History:  Procedure Laterality Date  . COLONOSCOPY WITH PROPOFOL N/A 12/01/2014   Procedure: COLONOSCOPY WITH PROPOFOL;  Surgeon: [name], MD;  Location: [facility];  Service: Endoscopy;  Laterality: N/A;  . EYE SURGERY  age 1   uncross eyes  . POLYPECTOMY  12/01/2014   Procedure: POLYPECTOMY;  Surgeon: [name], MD;  Location: [facility];  Service: Endoscopy;;  . SPINAL CORD STIMULATOR IMPLANT    . SPINE SURGERY  01/21/12   implanted [employer] SCS     reports that he quit smoking about 7 months ago. His smoking use included Cigarettes. He started smoking about 31 years ago. He has a 30.00  pack-year smoking history. He has never used smokeless tobacco. He reports that he drinks alcohol. He reports that he does not use drugs.   family history includes Cancer in his father; Diabetes in his brother and sister; Lupus in his mother.   Outpatient Medications Prior to Visit  Medication Sig Dispense Refill  . amLODipine (NORVASC) 5 MG tablet Take 1 tablet (5 mg total) by mouth daily. 90 tablet 3  . buPROPion (WELLBUTRIN SR) 150 MG 12 hr tablet TAKE 1 TABLET (150 MG TOTAL) BY MOUTH 2 (TWO) TIMES DAILY. 60 tablet 3  . DULoxetine (CYMBALTA) 60 MG capsule Take 1 capsule (60 mg total) by mouth daily. 90 capsule 1  . fluticasone furoate-vilanterol (BREO ELLIPTA) 100-25 MCG/INH AEPB Inhale 1 puff into the lungs daily as needed.    . gabapentin (NEURONTIN) 400 MG capsule Take 1 capsule (400 mg total) by mouth 3 (three) times daily. 270 capsule 1  . metroNIDAZOLE (METROGEL) 1 % gel Apply topically daily. 45 g 5  . pravastatin (PRAVACHOL) 40 MG tablet Take 1.5 tablets (60 mg total) by mouth at bedtime. 135 tablet 1  . rOPINIRole (REQUIP) 1 MG tablet Take 1 tablet (1 mg total) by mouth at bedtime. 90 tablet 3  . traZODone (DESYREL) 50 MG tablet TAKE 0.5-1 TABLETS (25-50 MG TOTAL) BY MOUTH AT BEDTIME AS NEEDED  FOR SLEEP. 30 tablet 11   No facility-administered medications prior to visit.      Allergies: Fentanyl; Shellfish allergy; Tomato; Aspirin; Dust mite extract; Penicillin g; and Pollen extract    PHYSICAL EXAM: VS:  BP 96/62 (BP Location: Right Arm, Patient Position: Sitting, Cuff Size: Normal)   Pulse 72   Ht 5\' 10"  (1.778 m)   Wt 156 lb 8 oz (71 kg)   BMI 22.46 kg/m  , Body mass index is 22.46 kg/m. Wt Readings from Last 3 Encounters:  07/03/16 156 lb 8 oz (71 kg)  04/17/16 161 lb (73 kg)  04/02/16 159 lb 9.6 oz (72.4 kg)   GENERAL:  well developed, well nourished, obese, not in acute distress HEENT: normocephalic, pink conjunctivae, anicteric sclerae, no xanthelasma, normal  dentition, oropharynx clear NECK:  no neck vein engorgement, JVP normal, no hepatojugular reflux, carotid upstroke brisk and symmetric, no bruit, no thyromegaly, no lymphadenopathy LUNGS:  good respiratory effort, clear to auscultation bilaterally CV:  PMI not displaced, no thrills, no lifts, S1 and S2 within normal limits, no palpable S3 or S4, no murmurs, no rubs, no gallops ABD:  Soft, nontender, nondistended, normoactive bowel sounds, no abdominal aortic bruit, no hepatomegaly, no splenomegaly MS: nontender back, no kyphosis, no scoliosis, no joint deformities EXT:  2+ DP/PT pulses, no edema, no varicosities, no cyanosis, no clubbing SKIN: warm, nondiaphoretic, normal turgor, no ulcers NEUROPSYCH: alert, oriented to person, place, and time, sensory/motor grossly intact, normal mood, appropriate affect  Recent Labs: 11/19/2015: TSH 2.08 04/02/2016: BUN 11; Creat 1.15; Hemoglobin 15.2; Platelets 218; Potassium 5.0; Sodium 138 06/02/2016: ALT 14   Lipid Panel    Component Value Date/Time   CHOL 142 06/02/2016 1112   CHOL 163 11/15/2014 1317   TRIG 148 06/02/2016 1112   HDL 35 (L) 06/02/2016 1112   HDL 47 11/15/2014 1317   CHOLHDL 4.1 06/02/2016 1112   VLDL 30 06/02/2016 1112   LDLCALC 77 06/02/2016 1112   LDLCALC 103 (H) 11/15/2014 1317     Other studies Reviewed:  EKG:  The ekg from 04/17/2016 was personally reviewed by me and it revealed sinus rhythm, 80 BPM.  Additional studies/ records that were reviewed personally reviewed by me today include:  Echo 05/13/2016: Left ventricle: The cavity size was normal. There was mild   concentric hypertrophy. Systolic function was normal. The   estimated ejection fraction was in the range of 55% to 60%. Wall   motion was normal; there were no regional wall motion   abnormalities. Doppler parameters are consistent with abnormal   left ventricular relaxation (grade 1 diastolic dysfunction). - Aorta: Aortic root dimension: 40 mm (ED). -  Aortic root: The aortic root was mildly dilated.  Nuclear stress test 05/01/2016:  There was no ST segment deviation noted during stress.  No T wave inversion was noted during stress.  This is a low risk study.  Nuclear stress EF: 60%.   Myocardial perfusion imaging revealed a small sized defect involving the apical to basal inferior walls. Degree of photon reduction was mild to moderate. It appeared fixed at rest. This perfusion defect went away with attenuation correction. This is consistent with diaphragmatic attenuation artifact.  ASSESSMENT AND PLAN: Coronary atherosclerosis noted on CT Atypical chest pain. No evidence of ischemia on nuclear stress test. LVEF normal. If no contraindication, may try Plavix 75 mg by mouth daily. Patient reports allergies to to aspirin. Continue risk factor modification: Blood pressure control, hyperlipidemia controlled.  Aortic root dilatation Noted  to be 4 cm on echo, 4.2 cm on a CT chest. Recommend to repeat echo in 6 months from first one, and consider repeat imaging with CTA in 1 year's time. Recommend blood pressure control, avoidance of isometric exercises/bearing down. Recommend to start blood pressure monitoring. If blood pressure is in the 130s 140s, may try beta blockade. Patient will inform us of his blood pressure measurements.   Hypertension BP is well controlled. Continue monitoring BP. Continue current medical therapy and lifestyle changes.  Hyperlipidemia PCP following labs, LDL goal is less than 70 ideally.   Current medicines are reviewed at length with the patient today.  The patient does not have concerns regarding medicines.  Labs/ tests ordered today include:  Orders Placed This Encounter  Procedures  . ECHOCARDIOGRAM COMPLETE    I had a lengthy and detailed discussion with the patient regarding diagnoses, prognosis, diagnostic options, treatment options , and side effects of medications.   I counseled the patient  on importance of lifestyle modification including heart healthy diet, regular physical activity .   Disposition:   FU withCardiology in one year   Signed, Wende Bushy, MD  07/03/2016 12:12 PM    [place]  This note was generated in part with voice recognition software and I apologize for any typographical errors that were not detected and corrected.

## 2016-07-03 NOTE — Telephone Encounter (Signed)
Pt calling asking if we can please send in a prescription for a BP machine in for patient. [facility] uses [facility] [place] [address] Please advise

## 2016-07-03 NOTE — Patient Instructions (Addendum)
Testing/Procedures: Your physician has requested that you have an echocardiogram in 6 months. Echocardiography is a painless test that uses sound waves to create images of your heart. It provides your doctor with information about the size and shape of your heart and how well your heart's chambers and valves are working. This procedure takes approximately one hour. There are no restrictions for this procedure.  Your physician has requested that you regularly monitor and record your blood pressure readings at home. Please use the same machine at the same time of day to check your readings and record them to bring to your follow-up visit.  Please call if BP remains greater than 140/80  Follow-Up: Your physician wants you to follow-up in: 1 year. You will receive a reminder letter in the mail two months in advance. If you don't receive a letter, please call our office to schedule the follow-up appointment.   It was a pleasure seeing you today here in the office. Please do not hesitate to give [place] a call back if you have any further questions. [place], BSN

## 2016-07-04 ENCOUNTER — Other Ambulatory Visit: Payer: Self-pay

## 2016-07-04 MED ORDER — BUPROPION HCL ER (SR) 150 MG PO TB12: 150.0000 mg | ORAL_TABLET | Freq: Two times a day (BID) | ORAL | 6 refills | Status: DC

## 2016-07-04 NOTE — Telephone Encounter (Signed)
Left detailed message that I sent in prescription for blood pressure kit to pharmacy requested but that I was unsure if this would be covered and to check with them and insurance with instructions to call back if questions.

## 2016-08-20 ENCOUNTER — Telehealth: Payer: Self-pay | Admitting: Family Medicine

## 2016-08-20 ENCOUNTER — Ambulatory Visit (INDEPENDENT_AMBULATORY_CARE_PROVIDER_SITE_OTHER): Payer: Medicare HMO | Admitting: Family Medicine

## 2016-08-20 ENCOUNTER — Encounter: Payer: Self-pay | Admitting: Family Medicine

## 2016-08-20 VITALS — BP 130/72 | HR 75 | Temp 98.3°F | Resp 18 | Ht 70.0 in | Wt 148.4 lb

## 2016-08-20 DIAGNOSIS — J0100 Acute maxillary sinusitis, unspecified: Secondary | ICD-10-CM | POA: Diagnosis not present

## 2016-08-20 DIAGNOSIS — F17210 Nicotine dependence, cigarettes, uncomplicated: Secondary | ICD-10-CM | POA: Diagnosis not present

## 2016-08-20 DIAGNOSIS — K5900 Constipation, unspecified: Secondary | ICD-10-CM | POA: Diagnosis not present

## 2016-08-20 DIAGNOSIS — R05 Cough: Secondary | ICD-10-CM | POA: Diagnosis not present

## 2016-08-20 DIAGNOSIS — R059 Cough, unspecified: Secondary | ICD-10-CM

## 2016-08-20 DIAGNOSIS — R69 Illness, unspecified: Secondary | ICD-10-CM | POA: Diagnosis not present

## 2016-08-20 MED ORDER — BENZONATATE 100 MG PO CAPS: 100.0000 mg | ORAL_CAPSULE | Freq: Two times a day (BID) | ORAL | 0 refills | Status: DC | PRN

## 2016-08-20 MED ORDER — POLYETHYLENE GLYCOL 3350 17 GM/SCOOP PO POWD: 17.0000 g | Freq: Two times a day (BID) | ORAL | 1 refills | Status: DC | PRN

## 2016-08-20 MED ORDER — FLUTICASONE PROPIONATE 50 MCG/ACT NA SUSP: 2.0000 | Freq: Every day | NASAL | 0 refills | Status: DC

## 2016-08-20 MED ORDER — FLUTICASONE FUROATE-VILANTEROL 100-25 MCG/INH IN AEPB: 1.0000 | INHALATION_SPRAY | Freq: Every day | RESPIRATORY_TRACT | 0 refills | Status: DC | PRN

## 2016-08-20 MED ORDER — DOXYCYCLINE HYCLATE 100 MG PO TABS: 100.0000 mg | ORAL_TABLET | Freq: Two times a day (BID) | ORAL | 0 refills | Status: DC

## 2016-08-20 MED ORDER — DOXYCYCLINE HYCLATE 100 MG PO TABS: 100.0000 mg | ORAL_TABLET | Freq: Two times a day (BID) | ORAL | 0 refills | Status: AC

## 2016-08-20 NOTE — Progress Notes (Addendum)
Name: Samuel Burnett   MRN: [id number]    DOB: Jun 15, 1960   Date:08/20/2016       Progress Note  Subjective  Chief Complaint  Chief Complaint  Patient presents with  . Sinusitis    cough, nasal congestion, drainage    HPI  Pt presents with 11-day history of sinus congestion, rhinorrhea, productive cough, some shortness of breath. Has been using Benadryl, Oxymetazoline Nasal pray, Ventolin PRN, and Breo PRN.  Yolanda Bonine is recent sick contact, Discussed need to use Breo daily and not PRN in order to be effective. No NVD or abdominal pain, no vision changes or eye swelling, no ear pain/fullness, no Chest pain or shortness of breath. Discussed stopping Oxymetazoline and trying flonase.   Constipation: Endorses constipation and requests something for this - last BM was 3-4 days ago.  No abdominal pain, has been drinking plenty of water and eating a balanced diet with plenty of fiber.  No abdominal pain or increased flatulence.  Current Smoker: Pt is current cigarette smoker, but states he is ready to quit.  Says he has prior quit attempt and started back over the last few months due to stress.   Patient Active Problem List   Diagnosis Date Noted  . Aortic atherosclerosis ([place]) 04/02/2016  . CAD (coronary artery disease) 04/02/2016  . Personal history of tobacco use, presenting hazards to health 01/20/2016  . Neutropenia ([place]) 01/16/2016  . Positive RPR test 12/21/2015  . Long term systemic steroid user 12/20/2015  . Screen for STD (sexually transmitted disease) 12/20/2015  . Hx of myocardial infarction 12/20/2015  . Prostate cancer screening 12/20/2015  . Preventative health care 12/20/2015  . Rosacea 11/21/2015  . Encounter for medication monitoring 11/19/2015  . Weight loss, abnormal 11/19/2015  . Chronic lumbar pain 05/01/2015  . Type O blood, Rh negative 04/24/2015  . Hx of colonic polyps   . Benign neoplasm of descending colon   . Benign neoplasm of sigmoid colon   . History of  syphilis 11/15/2014  . Allergic rhinitis 09/12/2014  . Anxiety and depression 09/12/2014  . ED (erectile dysfunction) of organic origin 09/12/2014  . HIV (human immunodeficiency virus infection) ([place]) 09/12/2014  . HLD (hyperlipidemia) 09/12/2014  . Hypertension goal BP (blood pressure) < 140/90 09/12/2014  . Degenerative arthritis of lumbar spine 09/12/2014  . Restless leg 09/12/2014  . Vitamin D deficiency 09/12/2014  . Scoliosis 09/12/2014  . AD (atopic dermatitis) 04/06/2014  . History of concussion 04/19/2013  . Chronic radicular lumbar pain 09/09/2012    Social History  Substance Use Topics  . Smoking status: Former Smoker    Packs/day: 1.00    Years: 30.00    Types: Cigarettes    Start date: 09/11/1984    Quit date: 11/08/2015  . Smokeless tobacco: Never Used  . Alcohol use 0.0 oz/week     Comment: may drink on "special occasions"     Current Outpatient Prescriptions:  .  amLODipine (NORVASC) 5 MG tablet, Take 1 tablet (5 mg total) by mouth daily., Disp: 90 tablet, Rfl: 3 .  Blood Pressure Monitor KIT, 1 kit by Does not apply route as directed., Disp: 1 each, Rfl: 0 .  buPROPion (WELLBUTRIN SR) 150 MG 12 hr tablet, Take 1 tablet (150 mg total) by mouth 2 (two) times daily., Disp: 60 tablet, Rfl: 6 .  DULoxetine (CYMBALTA) 60 MG capsule, Take 1 capsule (60 mg total) by mouth daily., Disp: 90 capsule, Rfl: 1 .  fluticasone furoate-vilanterol (BREO ELLIPTA) 100-25 MCG/INH AEPB,  Inhale 1 puff into the lungs daily as needed., Disp: , Rfl:  .  gabapentin (NEURONTIN) 400 MG capsule, Take 1 capsule (400 mg total) by mouth 3 (three) times daily., Disp: 270 capsule, Rfl: 1 .  pravastatin (PRAVACHOL) 40 MG tablet, Take 1.5 tablets (60 mg total) by mouth at bedtime., Disp: 135 tablet, Rfl: 1 .  rOPINIRole (REQUIP) 1 MG tablet, Take 1 tablet (1 mg total) by mouth at bedtime., Disp: 90 tablet, Rfl: 3 .  traZODone (DESYREL) 50 MG tablet, TAKE 0.5-1 TABLETS (25-50 MG TOTAL) BY MOUTH AT  BEDTIME AS NEEDED FOR SLEEP., Disp: 30 tablet, Rfl: 11  Allergies  Allergen Reactions  . Fentanyl Shortness Of Breath  . Shellfish Allergy Other (See Comments)    Angioedema  . Tomato Other (See Comments)    Angioedema  . Aspirin Nausea And Vomiting  . Dust Mite Extract   . Penicillin G Swelling    Other reaction(s): Difficulty breathing  . Pollen Extract     ROS  Ten systems reviewed and is negative except as mentioned in HPI  Objective  Vitals:   08/20/16 1041  BP: 130/72  Pulse: 75  Resp: 18  Temp: 98.3 F (36.8 C)  TempSrc: Oral  SpO2: 93%  Weight: 148 lb 6.4 oz (67.3 kg)  Height: 5' 10"  (1.778 m)    Body mass index is 21.29 kg/m.  Nursing Note and Vital Signs reviewed.  Physical Exam  Constitutional: Patient appears well-developed and well-nourished. Obese No distress.  HEENT: head atraumatic, normocephalic, pupils equal and reactive to light, EOM's intact, TM's without erythema or bulging, Positive for moderate bilateral maxillary and mild bilateral frontal sinus pain on palpation, neck supple without lymphadenopathy, oropharynx moderately erythematous and moist without exudate Cardiovascular: Normal rate, regular rhythm, S1/S2 present.  No murmur or rub heard. No BLE edema. Pulmonary/Chest: Effort normal and breath sounds have coarse rhonchi to RLL otherwise clear. No respiratory distress or retractions. Abdominal: Soft and non-tender, bowel sounds present x4 quadrants. Psychiatric: Patient has a normal mood and affect. behavior is normal. Judgment and thought content normal.  Recent Results (from the past 2160 hour(s))  ALT     Status: None   Collection Time: 06/02/16 11:12 AM  Result Value Ref Range   ALT 14 9 - 46 U/L  Lipid panel     Status: Abnormal   Collection Time: 06/02/16 11:12 AM  Result Value Ref Range   Cholesterol 142 <200 mg/dL   Triglycerides 148 <150 mg/dL   HDL 35 (L) >40 mg/dL   Total CHOL/HDL Ratio 4.1 <5.0 Ratio   VLDL 30 <30  mg/dL   LDL Cholesterol 77 <100 mg/dL     Assessment & Plan  1. Acute non-recurrent maxillary sinusitis - doxycycline (VIBRA-TABS) 100 MG tablet; Take 1 tablet (100 mg total) by mouth 2 (two) times daily.  Dispense: 14 tablet; Refill: 0 - fluticasone (FLONASE) 50 MCG/ACT nasal spray; Place 2 sprays into both nostrils daily.  Dispense: 16 g; Refill: 0  2. Cigarette smoker - fluticasone furoate-vilanterol (BREO ELLIPTA) 100-25 MCG/INH AEPB; Inhale 1 puff into the lungs daily as needed.  Dispense: 1 each; Refill: 0 - Discussed need for quitting smoking and patient states he is ready to do this, but does not want medication assistance.  Encouraged patient to follow up with [place] if he needs support. Congratulated him on this decision.  3. Constipation, unspecified constipation type - polyethylene glycol powder (GLYCOLAX/MIRALAX) powder; Take 17 g by mouth 2 (two) times daily as  needed.  Dispense: 3350 g; Refill: 1 - Follow up in 2-3 days if no improvement.  4. Cough - benzonatate (TESSALON PERLES) 100 MG capsule; Take 1 capsule (100 mg total) by mouth 2 (two) times daily as needed for cough.  Dispense: 20 capsule; Refill: 0  -Red flags and when to present for emergency care or RTC including fever >101.85F, chest pain, shortness of breath, new/worsening/un-resolving symptoms, eye pain/swelling, abdominal pain, blood in stool, reviewed with patient at time of visit. Follow up and care instructions discussed and provided in AVS. -Reviewed Health Maintenance: Has 8mofollow up scheduled.  --------------------------- I have reviewed this encounter including the documentation in this note and/or discussed this patient with the pJohney Maine FNP, NP-C. I am certifying that I agree with the content of this note as supervising physician.  MEnid Derry [facility] 08/20/2016, 12:14 PM

## 2016-08-20 NOTE — Telephone Encounter (Signed)
Scripts cancelled with [employer]

## 2016-08-20 NOTE — Telephone Encounter (Signed)
Medications re-ordered to go to correct pharmacy. Please call [employer] and give verbal to cancel today's medication orders - Tessalon, Doxycycline, Breo, Flonase, and Miralax. Thank you!

## 2016-08-20 NOTE — Telephone Encounter (Signed)
PT CALLED AND SAID THAT HIS RX FOR TODAY IS TO BE SENT TO [facility] [address] [place].

## 2016-09-17 ENCOUNTER — Telehealth: Payer: Self-pay

## 2016-09-17 NOTE — Telephone Encounter (Signed)
Patient called states is still sick, wants to see if you can call in antibiotic?

## 2016-09-18 NOTE — Telephone Encounter (Signed)
Patient notified will schedule an appt

## 2016-09-18 NOTE — Telephone Encounter (Signed)
He needs to come back in to be seen either by Dr. Sanda Klein (PCP) or myself. Thank you!

## 2016-09-19 ENCOUNTER — Encounter: Payer: Self-pay | Admitting: Family Medicine

## 2016-09-19 ENCOUNTER — Ambulatory Visit (INDEPENDENT_AMBULATORY_CARE_PROVIDER_SITE_OTHER): Payer: Medicare HMO | Admitting: Family Medicine

## 2016-09-19 ENCOUNTER — Other Ambulatory Visit: Payer: Self-pay | Admitting: Family Medicine

## 2016-09-19 ENCOUNTER — Ambulatory Visit
Admission: RE | Admit: 2016-09-19 | Discharge: 2016-09-19 | Disposition: A | Discharge status: Home / Self Care | Payer: Medicare HMO | Source: Ambulatory Visit | Attending: Family Medicine | Admitting: Family Medicine

## 2016-09-19 VITALS — BP 122/80 | HR 88 | Temp 97.5°F | Resp 18 | Ht 70.0 in | Wt 144.2 lb

## 2016-09-19 DIAGNOSIS — R9389 Abnormal findings on diagnostic imaging of other specified body structures: Secondary | ICD-10-CM

## 2016-09-19 DIAGNOSIS — R69 Illness, unspecified: Secondary | ICD-10-CM | POA: Diagnosis not present

## 2016-09-19 DIAGNOSIS — J439 Emphysema, unspecified: Secondary | ICD-10-CM | POA: Diagnosis not present

## 2016-09-19 DIAGNOSIS — F17201 Nicotine dependence, unspecified, in remission: Secondary | ICD-10-CM | POA: Diagnosis not present

## 2016-09-19 DIAGNOSIS — R918 Other nonspecific abnormal finding of lung field: Secondary | ICD-10-CM | POA: Insufficient documentation

## 2016-09-19 DIAGNOSIS — R0602 Shortness of breath: Secondary | ICD-10-CM | POA: Diagnosis not present

## 2016-09-19 DIAGNOSIS — J00 Acute nasopharyngitis [common cold]: Secondary | ICD-10-CM

## 2016-09-19 DIAGNOSIS — R0989 Other specified symptoms and signs involving the circulatory and respiratory systems: Secondary | ICD-10-CM | POA: Insufficient documentation

## 2016-09-19 MED ORDER — LEVOCETIRIZINE DIHYDROCHLORIDE 5 MG PO TABS: 5.0000 mg | ORAL_TABLET | Freq: Every evening | ORAL | 1 refills | Status: DC

## 2016-09-19 MED ORDER — FLUTICASONE PROPIONATE 50 MCG/ACT NA SUSP: 2.0000 | Freq: Every day | NASAL | 0 refills | Status: DC

## 2016-09-19 NOTE — Progress Notes (Signed)
Spoke with patient regarding CXR results and plan for STAT CT Chest. He is agreeable to plan of care.  Please call patient once appointment is scheduled. Thank you so much!

## 2016-09-19 NOTE — Progress Notes (Addendum)
Name: Samuel Burnett   MRN: [id number]    DOB: 1960/07/23   Date:09/19/2016       Progress Note  Subjective  Chief Complaint  Chief Complaint  Patient presents with  . Sinusitis    cough, congested on and off for 1 month    HPI  Pt presents with c/o recurrent sinus congestion and pain x3-4 days after being out in a friend's yard.  He completed Doxycycline after his 08/20/16 visit and was feeling a lot better until this incident a few days ago.  He has not been able to use flonase because the one that he had was recalled. He is needing to clear his throat often, some shortness of breath when he is coughing, and some body aches.  No fevers/chills, NVD, chest pain.  Cigarette Smoker: QUIT smoking a few weeks ago after our last visit. He is congratulated on this.  Patient Active Problem List   Diagnosis Date Noted  . Aortic atherosclerosis ([place]) 04/02/2016  . CAD (coronary artery disease) 04/02/2016  . Personal history of tobacco use, presenting hazards to health 01/20/2016  . Neutropenia ([place]) 01/16/2016  . Positive RPR test 12/21/2015  . Long term systemic steroid user 12/20/2015  . Screen for STD (sexually transmitted disease) 12/20/2015  . Hx of myocardial infarction 12/20/2015  . Prostate cancer screening 12/20/2015  . Preventative health care 12/20/2015  . Rosacea 11/21/2015  . Encounter for medication monitoring 11/19/2015  . Weight loss, abnormal 11/19/2015  . Chronic lumbar pain 05/01/2015  . Type O blood, Rh negative 04/24/2015  . Hx of colonic polyps   . Benign neoplasm of descending colon   . Benign neoplasm of sigmoid colon   . History of syphilis 11/15/2014  . Allergic rhinitis 09/12/2014  . Anxiety and depression 09/12/2014  . ED (erectile dysfunction) of organic origin 09/12/2014  . HIV (human immunodeficiency virus infection) ([place]) 09/12/2014  . HLD (hyperlipidemia) 09/12/2014  . Hypertension goal BP (blood pressure) < 140/90 09/12/2014  . Degenerative  arthritis of lumbar spine 09/12/2014  . Restless leg 09/12/2014  . Vitamin D deficiency 09/12/2014  . Scoliosis 09/12/2014  . AD (atopic dermatitis) 04/06/2014  . History of concussion 04/19/2013  . Chronic radicular lumbar pain 09/09/2012    Social History  Substance Use Topics  . Smoking status: Former Smoker    Packs/day: 1.00    Years: 30.00    Types: Cigarettes    Start date: 09/11/1984    Quit date: 11/08/2015  . Smokeless tobacco: Never Used  . Alcohol use 0.0 oz/week     Comment: may drink on "special occasions"     Current Outpatient Prescriptions:  .  amLODipine (NORVASC) 5 MG tablet, Take 1 tablet (5 mg total) by mouth daily., Disp: 90 tablet, Rfl: 3 .  Blood Pressure Monitor KIT, 1 kit by Does not apply route as directed., Disp: 1 each, Rfl: 0 .  buPROPion (WELLBUTRIN SR) 150 MG 12 hr tablet, Take 1 tablet (150 mg total) by mouth 2 (two) times daily., Disp: 60 tablet, Rfl: 6 .  DULoxetine (CYMBALTA) 60 MG capsule, Take 1 capsule (60 mg total) by mouth daily., Disp: 90 capsule, Rfl: 1 .  gabapentin (NEURONTIN) 400 MG capsule, Take 1 capsule (400 mg total) by mouth 3 (three) times daily., Disp: 270 capsule, Rfl: 1 .  polyethylene glycol powder (GLYCOLAX/MIRALAX) powder, Take 17 g by mouth 2 (two) times daily as needed., Disp: 3350 g, Rfl: 1 .  pravastatin (PRAVACHOL) 40 MG tablet, Take 1.5  tablets (60 mg total) by mouth at bedtime., Disp: 135 tablet, Rfl: 1 .  rOPINIRole (REQUIP) 1 MG tablet, Take 1 tablet (1 mg total) by mouth at bedtime., Disp: 90 tablet, Rfl: 3 .  traZODone (DESYREL) 50 MG tablet, TAKE 0.5-1 TABLETS (25-50 MG TOTAL) BY MOUTH AT BEDTIME AS NEEDED FOR SLEEP., Disp: 30 tablet, Rfl: 11 .  fluticasone (FLONASE) 50 MCG/ACT nasal spray, Place 2 sprays into both nostrils daily. (Patient not taking: Reported on 09/19/2016), Disp: 16 g, Rfl: 0 .  fluticasone furoate-vilanterol (BREO ELLIPTA) 100-25 MCG/INH AEPB, Inhale 1 puff into the lungs daily as needed. (Patient  not taking: Reported on 09/19/2016), Disp: 1 each, Rfl: 0  Allergies  Allergen Reactions  . Fentanyl Shortness Of Breath  . Shellfish Allergy Other (See Comments)    Angioedema  . Tomato Other (See Comments)    Angioedema  . Aspirin Nausea And Vomiting  . Dust Mite Extract   . Penicillin G Swelling    Other reaction(s): Difficulty breathing  . Pollen Extract     ROS  Ten systems reviewed and is negative except as mentioned in HPI  Objective  Vitals:   09/19/16 0838  BP: 122/80  Pulse: 88  Resp: 18  Temp: (!) 97.5 F (36.4 C)  TempSrc: Oral  SpO2: 95%  Weight: 144 lb 3.2 oz (65.4 kg)  Height: 5' 10"  (1.778 m)   Body mass index is 20.69 kg/m.  Nursing Note and Vital Signs reviewed.  Physical Exam  Constitutional: Patient appears well-developed and well-nourished.  No distress.  HEENT: head atraumatic, normocephalic, pupils equal and reactive to light, EOM's intact, TM's without erythema or bulging, + maxillary sinus tenderness on palpation bilaterally; no frontal sinus pain on palpation, neck supple without lymphadenopathy, oropharynx pink and moist without exudate Cardiovascular: Normal rate, regular rhythm, S1/S2 present.  No murmur or rub heard. No BLE edema. Pulmonary/Chest: Effort normal and breath sounds with rhonchi bilateral bases, otherwise clear. No respiratory distress or retractions. Abdominal: Soft and non-tender, bowel sounds present x4 quadrants.  No CVA Tenderness Psychiatric: Patient has a normal mood and affect. behavior is normal. Judgment and thought content normal.  No results found for this or any previous visit (from the past 2160 hour(s)).   Assessment & Plan  1. Acute rhinitis - fluticasone (FLONASE) 50 MCG/ACT nasal spray; Place 2 sprays into both nostrils daily.  Dispense: 16 g; Refill: 0 - levocetirizine (XYZAL) 5 MG tablet; Take 1 tablet (5 mg total) by mouth every evening.  Dispense: 30 tablet; Refill: 1  2. Rhonchi - DG Chest 2  View; Future  3. Tobacco dependence in remission - DG Chest 2 View; Future  - We will avoid antibiotics for today and try allergy medications alone. Pt has follow up on 09/27/2016 and we will re-check this at that visit.  Push fluids, Tylenol PRN for body aches.  -Red flags and when to present for emergency care or RTC including fever >101.88F, chest pain, shortness of breath, new/worsening/un-resolving symptoms, eye pain or swelling reviewed with patient at time of visit. Follow up and care instructions discussed and provided in AVS.  I have reviewed this encounter including the documentation in this note and/or discussed this patient with the Johney Maine, FNP, NP-C. I am certifying that I agree with the content of this note as supervising physician.  Steele Sizer, MD [facility] 09/19/2016, 10:52 AM

## 2016-09-22 ENCOUNTER — Telehealth: Payer: Self-pay

## 2016-09-22 ENCOUNTER — Ambulatory Visit
Admission: RE | Admit: 2016-09-22 | Discharge: 2016-09-22 | Disposition: A | Discharge status: Home / Self Care | Payer: Medicare HMO | Source: Ambulatory Visit | Attending: Family Medicine | Admitting: Family Medicine

## 2016-09-22 ENCOUNTER — Encounter: Payer: Self-pay | Admitting: Family Medicine

## 2016-09-22 ENCOUNTER — Ambulatory Visit (INDEPENDENT_AMBULATORY_CARE_PROVIDER_SITE_OTHER): Payer: Medicare HMO | Admitting: Family Medicine

## 2016-09-22 ENCOUNTER — Other Ambulatory Visit
Admission: RE | Admit: 2016-09-22 | Discharge: 2016-09-22 | Disposition: A | Discharge status: Home / Self Care | Payer: Medicare HMO | Source: Ambulatory Visit | Attending: Family Medicine | Admitting: Family Medicine

## 2016-09-22 VITALS — BP 114/68 | HR 81 | Temp 97.6°F | Resp 18 | Wt 152.8 lb

## 2016-09-22 DIAGNOSIS — J431 Panlobular emphysema: Secondary | ICD-10-CM | POA: Insufficient documentation

## 2016-09-22 DIAGNOSIS — B20 Human immunodeficiency virus [HIV] disease: Secondary | ICD-10-CM | POA: Insufficient documentation

## 2016-09-22 DIAGNOSIS — D702 Other drug-induced agranulocytosis: Secondary | ICD-10-CM

## 2016-09-22 DIAGNOSIS — J439 Emphysema, unspecified: Secondary | ICD-10-CM | POA: Insufficient documentation

## 2016-09-22 DIAGNOSIS — R918 Other nonspecific abnormal finding of lung field: Secondary | ICD-10-CM | POA: Diagnosis not present

## 2016-09-22 DIAGNOSIS — I2510 Atherosclerotic heart disease of native coronary artery without angina pectoris: Secondary | ICD-10-CM

## 2016-09-22 DIAGNOSIS — R938 Abnormal findings on diagnostic imaging of other specified body structures: Secondary | ICD-10-CM | POA: Insufficient documentation

## 2016-09-22 DIAGNOSIS — I700 Atherosclerosis of aorta: Secondary | ICD-10-CM | POA: Insufficient documentation

## 2016-09-22 DIAGNOSIS — J432 Centrilobular emphysema: Secondary | ICD-10-CM

## 2016-09-22 DIAGNOSIS — J449 Chronic obstructive pulmonary disease, unspecified: Secondary | ICD-10-CM | POA: Diagnosis not present

## 2016-09-22 DIAGNOSIS — R9389 Abnormal findings on diagnostic imaging of other specified body structures: Secondary | ICD-10-CM | POA: Insufficient documentation

## 2016-09-22 DIAGNOSIS — R69 Illness, unspecified: Secondary | ICD-10-CM | POA: Diagnosis not present

## 2016-09-22 DIAGNOSIS — Z21 Asymptomatic human immunodeficiency virus [HIV] infection status: Secondary | ICD-10-CM

## 2016-09-22 HISTORY — DX: Human immunodeficiency virus (HIV) disease: B20

## 2016-09-22 HISTORY — DX: Other nonspecific abnormal finding of lung field: R91.8

## 2016-09-22 HISTORY — DX: Emphysema, unspecified: J43.9

## 2016-09-22 HISTORY — DX: Asymptomatic human immunodeficiency virus (hiv) infection status: Z21

## 2016-09-22 LAB — CBC WITH DIFFERENTIAL/PLATELET
Basophils Absolute: 0.1 10*3/uL (ref 0–0.1)
Basophils Relative: 2 %
Eosinophils Absolute: 0.2 10*3/uL (ref 0–0.7)
Eosinophils Relative: 5 %
HCT: 42.2 % (ref 40.0–52.0)
Hemoglobin: 14 g/dL (ref 13.0–18.0)
Lymphocytes Relative: 44 %
Lymphs Abs: 2 10*3/uL (ref 1.0–3.6)
MCH: 31 pg (ref 26.0–34.0)
MCHC: 33.2 g/dL (ref 32.0–36.0)
MCV: 93.2 fL (ref 80.0–100.0)
Monocytes Absolute: 0.4 10*3/uL (ref 0.2–1.0)
Monocytes Relative: 9 %
Neutro Abs: 1.8 10*3/uL (ref 1.4–6.5)
Neutrophils Relative %: 40 %
Platelets: 204 10*3/uL (ref 150–440)
RBC: 4.53 MIL/uL (ref 4.40–5.90)
RDW: 16.1 % — ABNORMAL HIGH (ref 11.5–14.5)
WBC: 4.6 10*3/uL (ref 3.8–10.6)

## 2016-09-22 MED ORDER — IOPAMIDOL (ISOVUE-300) INJECTION 61%
75.0000 mL | Freq: Once | INTRAVENOUS | Status: AC | PRN
  Administered 2016-09-22: 75 mL via INTRAVENOUS

## 2016-09-22 NOTE — Progress Notes (Signed)
BP 114/68 (BP Location: Left Arm, Patient Position: Sitting, Cuff Size: Normal)   Pulse 81   Temp 97.6 F (36.4 C) (Oral)   Resp 18   Wt 152 lb 12.8 oz (69.3 kg)   SpO2 94%   BMI 21.92 kg/m    Subjective:    Patient ID: Samuel Burnett, male    DOB: [birth year], 58 y.o.   MRN: [id number]  HPI: Samuel Burnett is a 56 y.o. male  Chief Complaint  Patient presents with  . Results    CT Chest with Contrast    HPI Patient has had cough for a month Started in his sinuses; when he wakes up at night, throat is dry, has to have water to get relief Nose got congested and then started mouth breathing Cough is a dry cough, then says he is bringing up cloudy mucous Having a little SHOB, can still walk around, nothing serious Has some hip pain No fevers, no chills Saw another provider on Friday (today is Monday)  Had been seen in June, and was treated with doxycycline then and that helped some, but it came back Grandson had been sick, but he is better  Other provider got a chest CT Ground glass appearance ntoed; see results copied and pasted below: Chest CT 09/22/16 CLINICAL DATA:  Productive cough and chest discomfort for 3 weeks.  EXAM: CT CHEST WITH CONTRAST  TECHNIQUE: Multidetector CT imaging of the chest was performed during intravenous contrast administration.  CONTRAST:  36mL ISOVUE-300 IOPAMIDOL (ISOVUE-300) INJECTION 61%  COMPARISON:  06/02/2016  FINDINGS: Cardiovascular: The heart size is normal. No pericardial effusion. Coronary artery calcification is noted. Atherosclerotic calcification is noted in the wall of the thoracic aorta. Similar appearance of the aortic root measured up to 4.2 cm diameter at the sinuses of Valsalva on prior study.  Mediastinum/Nodes: No mediastinal lymphadenopathy. There is no hilar lymphadenopathy. The esophagus has normal imaging features. There is no axillary lymphadenopathy.  Lungs/Pleura: Centrilobular and paraseptal  emphysema noted with some bullous change in the right apex. Bronchiectasis noted both lower lobes as before. Interval development of focal ground-glass attenuation central left upper lobe (image 73 series 3) with a 5 mm nodular component seen on image 70. Chronic atelectasis or linear scarring in the right lower lobe is unchanged. Similar atelectasis or scarring in the left lung base. No pleural effusion.  Upper Abdomen: Unremarkable.  Musculoskeletal: Bone windows reveal no worrisome lytic or sclerotic osseous lesions. Spinal stimulator again noted.  IMPRESSION: 1. Interval development of focal ground-glass attenuation in the central left upper lobe. Imaging features are likely infectious/inflammatory. Follow-up CT chest in 3 months after treatment recommended to ensure resolution. 2.  Emphysema. (ICD10-J43.9) 3. Aortic Atherosclerois (ICD10-170.0) with stable appearance of the 4.2 cm diameter aortic root at the sinuses of Valsalva.   Electronically Signed   By: Misty Stanley M.D.   On: 09/22/2016 16:13  He needs handicapped sticker filled out for arthritis in back; cannot walk 200 feet without stopping he says  Aortic atherosclerosis; LDL came down from 192 to 77; not eating pork any more  Emphysema; smoking 3 cigarettes a day; so close to quitting   Depression screen [facility] 2/9 09/29/2016 09/22/2016 04/02/2016 12/20/2015 11/19/2015  Decreased Interest 0 0 0 0 0  Down, Depressed, Hopeless 0 0 0 0 0  PHQ - 2 Score 0 0 0 0 0    Relevant past medical, surgical, family and social history reviewed Past Medical History:  Diagnosis Date  . Allergy   .  Controlled insomnia   . Eczema   . Emphysema of lung ([place]) 09/22/2016  . HIV infection ([place])   . Hyperlipidemia   . Hypertension   . Numbness in right leg    outside of right foot, related to medical device implant in spine  . Osteoarthritis of lumbar spine    also- hips  . Other forms of scoliosis, thoracolumbar region     . Restless leg syndrome   . Syphilis    Past Surgical History:  Procedure Laterality Date  . COLONOSCOPY WITH PROPOFOL N/A 12/01/2014   Procedure: COLONOSCOPY WITH PROPOFOL;  Surgeon: [name], MD;  Location: ;  Service: Endoscopy;  Laterality: N/A;  . EYE SURGERY  age 29   uncross eyes  . POLYPECTOMY  12/01/2014   Procedure: POLYPECTOMY;  Surgeon: [name], MD;  Location: [facility];  Service: Endoscopy;;  . SPINAL CORD STIMULATOR IMPLANT    . SPINE SURGERY  01/21/12   implanted medtronics SCS   Family History  Problem Relation Age of Onset  . Lupus Mother   . Cancer Father        Pancreatis  . Diabetes Sister   . Diabetes Brother    Social History   Social History  . Marital status: Single    Spouse name: N/A  . Number of children: N/A  . Years of education: N/A   Occupational History  . Not on file.   Social History Main Topics  . Smoking status: Former Smoker    Packs/day: 1.00    Years: 30.00    Types: Cigarettes    Start date: 09/11/1984    Quit date: 11/08/2015  . Smokeless tobacco: Never Used  . Alcohol use 0.0 oz/week     Comment: may drink on "special occasions"  . Drug use: No  . Sexual activity: Yes   Other Topics Concern  . Not on file   Social History Narrative  . No narrative on file  current smoking; smoking 3 cigarettes a day  Interim medical history since last visit reviewed. Allergies and medications reviewed  Review of Systems Per HPI unless specifically indicated above     Objective:    BP 114/68 (BP Location: Left Arm, Patient Position: Sitting, Cuff Size: Normal)   Pulse 81   Temp 97.6 F (36.4 C) (Oral)   Resp 18   Wt 152 lb 12.8 oz (69.3 kg)   SpO2 94%   BMI 21.92 kg/m     Physical Exam  Constitutional: He appears well-developed and well-nourished. No distress.  HENT:  Head: Normocephalic and atraumatic.  Eyes: EOM are normal. No scleral icterus.  Neck: No thyromegaly present.   Cardiovascular: Normal rate and regular rhythm.   Pulmonary/Chest: Effort normal and breath sounds normal.  Abdominal: Soft. Bowel sounds are normal. He exhibits no distension.  Musculoskeletal: He exhibits no edema.  Neurological: Coordination normal.  Skin: Skin is warm and dry. No rash noted. No pallor.  Psychiatric: He has a normal mood and affect. His behavior is normal. Judgment and thought content normal.      Assessment & Plan:   Problem List Items Addressed This Visit      Cardiovascular and Mediastinum   CAD (coronary artery disease)    statin, smoking cessation; says he cannot take aspirin      Aortic atherosclerosis ([place])    Noted on scan, discussed; important to keep LDL under 70, smoking cessation key        Respiratory  Emphysema of lung ([place])    Discussed; urged him to quit smoking completely        Other   Neutropenia ([place])    Check CBC      Relevant Orders   CBC with Differential/Platelet   Ground glass opacity present on imaging of lung - Primary    Discussed findings with patient; with his HIV status and unknown viral load and not being on any antiretrovirals, I have to consider Pneumocystis as a possibility; reviewed information below; message left for infectious disease doctor; decided to not prescribe high dose TMP/SMX until I speak with him, since patient is stable, with close f/u next week  From Up-to-Date Acute diseases that manifest with diffuse ground-glass opacities include pulmonary edema, pulmonary hemorrhage (image 10), Pneumocystis jirovecii pneumonia, mycoplasma and viral pneumonia, acute interstitial pneumonia (AIP), acute eosinophilic lung disease, and early hypersensitivity pneumonia [13]. Among HIV-positive patients, ground-glass attenuation is present in 90 percent of patients with Pneumocystic jirovecii pneumonia and absent in 95 percent of patients who do not have Pneumocystic jirovecii pneumonia. Among immunocompromised patients  without AIDS, ground-glass opacities may indicate Pneumocystic jirovecii pneumonia, drug reactions, pulmonary hemorrhage, or lymphoma.  Preferred regimen-TMP-SMX is the preferred regimen for the treatment of PCP in HIV-infected patients (table 1). Therapy should be administered for 21 days. Trimethoprim is a dihydrofolate reductase inhibitor, and sulfamethoxazole is a dihydropteroate synthetase inhibitor; when coupled together they are synergistic in eradicating P. jirovecii. (See "Trimethoprim-sulfamethoxazole: An overview".) The standard dose of TMP-SMX is 15 to 20 mg/kg/day orally or intravenously in three or four divided doses. Dosing of TMP-SMX is based upon the TMP component and expressed as mg/kg per day of TMP. Dose modifications for renal impairment may be needed; detailed dosing recommendations are available in the TMP-SMX drug information monograph included within UpToDate. The severity of disease dictates whether oral or intravenous therapy should be used: ?For patients with mild to moderate disease, we prefer oral therapy since TMP-SMX has excellent oral absorption. For most patients, this turns out to be two double-strength tablets given every six or eight hours, depending upon body weight.          Relevant Orders   CBC with Differential/Platelet   HIV (human immunodeficiency virus infection) ([place])    Not on anti-retrovirals; no recent CD4 count or viral load; message left for ID specialist; patient is at risk for opportunistic infection      Relevant Orders   CBC with Differential/Platelet   Abnormal CT of the chest    Reviewed with patient; rescan in 3 months; pulm, ID involved          Follow up plan: No Follow-up on file.  An after-visit summary was printed and given to the patient at [facility].  Please see the patient instructions which may contain other information and recommendations beyond what is mentioned above in the assessment and plan.  Meds ordered this  encounter  Medications  . HYDROCORTISONE, TOPICAL, 1 % SOLN    Sig: Apply topically.    Orders Placed This Encounter  Procedures  . CBC with Differential/Platelet   I spoke with ID doctor, tests pending; holding on antibiotic therapy until results are back Possibility of malignancy, so will rescan chest in 3 months  Face-to-face time with patient was more than 40 minutes, >50% time spent counseling and coordination of care

## 2016-09-22 NOTE — Assessment & Plan Note (Signed)
Check CBC 

## 2016-09-22 NOTE — Patient Instructions (Signed)
Please do have the labs done at the hospital then you can go home I do encourage you to quit smoking Call [phone] to sign up for smoking cessation classes You can call 1-800-QUIT-NOW to talk with a smoking cessation coach Try to limit saturated fats in your diet (bologna, hot dogs, barbeque, cheeseburgers, hamburgers, steak, bacon, sausage, cheese, etc.) and get more fresh fruits, vegetables, and whole grains I will contact you after I speak to your infectious disease doctor tomorrow If you haven't heard back from me by noon, call me

## 2016-09-22 NOTE — Assessment & Plan Note (Signed)
Discussed; urged him to quit smoking completely

## 2016-09-22 NOTE — Telephone Encounter (Signed)
Patient was informed that he has been scheduled to have his CT today at 3:30pm at the [facility]. Patient was instructed to arrive 15 mins early and nothing but liquids after 11:30am.   Patient voiced verbal understanding and said thanks

## 2016-09-22 NOTE — Assessment & Plan Note (Signed)
statin, smoking cessation; says he cannot take aspirin

## 2016-09-22 NOTE — Assessment & Plan Note (Addendum)
Discussed findings with patient; with his HIV status and unknown viral load and not being on any antiretrovirals, I have to consider Pneumocystis as a possibility; reviewed information below; message left for infectious disease doctor; decided to not prescribe high dose TMP/SMX until I speak with him, since patient is stable, with close f/u next week  From Up-to-Date Acute diseases that manifest with diffuse ground-glass opacities include pulmonary edema, pulmonary hemorrhage (image 10), Pneumocystis jirovecii pneumonia, mycoplasma and viral pneumonia, acute interstitial pneumonia (AIP), acute eosinophilic lung disease, and early hypersensitivity pneumonia [13]. Among HIV-positive patients, ground-glass attenuation is present in 90 percent of patients with Pneumocystic jirovecii pneumonia and absent in 95 percent of patients who do not have Pneumocystic jirovecii pneumonia. Among immunocompromised patients without AIDS, ground-glass opacities may indicate Pneumocystic jirovecii pneumonia, drug reactions, pulmonary hemorrhage, or lymphoma.  Preferred regimen-TMP-SMX is the preferred regimen for the treatment of PCP in HIV-infected patients (table 1). Therapy should be administered for 21 days. Trimethoprim is a dihydrofolate reductase inhibitor, and sulfamethoxazole is a dihydropteroate synthetase inhibitor; when coupled together they are synergistic in eradicating P. jirovecii. (See "Trimethoprim-sulfamethoxazole: An overview".) The standard dose of TMP-SMX is 15 to 20 mg/kg/day orally or intravenously in three or four divided doses. Dosing of TMP-SMX is based upon the TMP component and expressed as mg/kg per day of TMP. Dose modifications for renal impairment may be needed; detailed dosing recommendations are available in the TMP-SMX drug information monograph included within UpToDate. The severity of disease dictates whether oral or intravenous therapy should be used: ?For patients with mild to moderate  disease, we prefer oral therapy since TMP-SMX has excellent oral absorption. For most patients, this turns out to be two double-strength tablets given every six or eight hours, depending upon body weight.

## 2016-09-23 ENCOUNTER — Telehealth: Payer: Self-pay | Admitting: Family Medicine

## 2016-09-23 ENCOUNTER — Other Ambulatory Visit: Payer: Self-pay

## 2016-09-23 DIAGNOSIS — R05 Cough: Secondary | ICD-10-CM

## 2016-09-23 DIAGNOSIS — R059 Cough, unspecified: Secondary | ICD-10-CM

## 2016-09-23 DIAGNOSIS — B59 Pneumocystosis: Secondary | ICD-10-CM

## 2016-09-23 DIAGNOSIS — B20 Human immunodeficiency virus [HIV] disease: Secondary | ICD-10-CM

## 2016-09-23 DIAGNOSIS — R918 Other nonspecific abnormal finding of lung field: Secondary | ICD-10-CM

## 2016-09-23 DIAGNOSIS — Z21 Asymptomatic human immunodeficiency virus [HIV] infection status: Secondary | ICD-10-CM

## 2016-09-23 LAB — T-HELPER CELLS CD4/CD8 %
% CD 4 Pos. Lymph.: 34.5 % (ref 30.8–58.5)
Absolute CD 4 Helper: 759 /uL (ref 359–1519)
Basophils Absolute: 0 10*3/uL (ref 0.0–0.2)
Basos: 1 %
CD3+CD4+ Cells/CD3+CD8+ Cells Bld: 0.76 — ABNORMAL LOW (ref 0.92–3.72)
CD3+CD8+ Cells # Bld: 1001 /uL — ABNORMAL HIGH (ref 109–897)
CD3+CD8+ Cells NFr Bld: 45.5 % — ABNORMAL HIGH (ref 12.0–35.5)
EOS (ABSOLUTE): 0.2 10*3/uL (ref 0.0–0.4)
Eos: 5 %
Hematocrit: 41.6 % (ref 37.5–51.0)
Hemoglobin: 13.9 g/dL (ref 13.0–17.7)
Immature Grans (Abs): 0 10*3/uL (ref 0.0–0.1)
Immature Granulocytes: 0 %
Lymphocytes Absolute: 2.2 10*3/uL (ref 0.7–3.1)
Lymphs: 49 %
MCH: 31 pg (ref 26.6–33.0)
MCHC: 33.4 g/dL (ref 31.5–35.7)
MCV: 93 fL (ref 79–97)
Monocytes Absolute: 0.4 10*3/uL (ref 0.1–0.9)
Monocytes: 8 %
Neutrophils Absolute: 1.6 10*3/uL (ref 1.4–7.0)
Neutrophils: 37 %
Platelets: 212 10*3/uL (ref 150–379)
RBC: 4.49 x10E6/uL (ref 4.14–5.80)
RDW: 15.4 % (ref 12.3–15.4)
WBC: 4.4 10*3/uL (ref 3.4–10.8)

## 2016-09-23 NOTE — Telephone Encounter (Signed)
Labs and referral has been entered patient has been notified to contact Dr. Elyn Peers office for an appt and we have set him up here on Monday.  He will come in today for sputum samples.

## 2016-09-23 NOTE — Telephone Encounter (Signed)
I spoke with Dr. Radonna Ricker; patient was supposed to be seeing him for f/u; has not seen him in about two years Dr. Radonna Ricker is willing to see patient on Thursday of this week; ask patient to call his office to schedule an appointment to be seen Ask patient to submit sputum specimen, and we need to do acid fast (AFB) staining to r/o TB and Pneumocystis staining, in addition to typical gram stain, culture, and sensitivities Coordinate with micro lab for entering orders please Dx: lung infiltrate, ground glass, cough, HIV (+) He is going to need to see a pulmonologist, may need bronch, so please enter pulmonary referral, high priority, wherever patient would like to go, dx: cough, ground glass infiltrate, possible nodule, sputum, and let them know may need bronch, HIV (+) Dr. Radonna Ricker agreed with not starting antibiotics until we know what we're testing appt with me next Monday please

## 2016-09-23 NOTE — Telephone Encounter (Signed)
I spoke with patient last night about CBC results; he felt fine waiting until the morning; advised to go to ER if worse overnight I called Dr. Ebony Hail office Tuesday morning He is not there today; explained that I need to speak with a covering colleague about patient who may have Pneumocystis I reached 1K nurse triage line recording after he transferred me to triage nurse If I do not hear back in timely fashion, I'll call [phone] and ask to speak to ID on-call I left detailed message for triage nurse and will await their response

## 2016-09-23 NOTE — Assessment & Plan Note (Signed)
Called ID at [facility], left message asking for return call today about possible Pneumocystis infection

## 2016-09-23 NOTE — Telephone Encounter (Signed)
I spoke with staff member who works with Dr. Radonna Ricker; I discussed my concerns, abnormal scan, possible opportunist infection vs other; need help from ID; she reviewed the CT scan, will page Dr. Radonna Ricker to have him call me ------------------------------- Please let Samuel Burnett know that I've spoken with Dr. Ebony Hail staff, they have paged him, and we're waiting on their response

## 2016-09-24 ENCOUNTER — Telehealth: Payer: Self-pay | Admitting: Pulmonary Disease

## 2016-09-24 DIAGNOSIS — R918 Other nonspecific abnormal finding of lung field: Secondary | ICD-10-CM | POA: Diagnosis not present

## 2016-09-24 DIAGNOSIS — R69 Illness, unspecified: Secondary | ICD-10-CM | POA: Diagnosis not present

## 2016-09-24 DIAGNOSIS — R05 Cough: Secondary | ICD-10-CM | POA: Diagnosis not present

## 2016-09-24 NOTE — Telephone Encounter (Signed)
Received urgent referral per sonya ok to add onto 7/20 schedule with Simonds at 130

## 2016-09-25 DIAGNOSIS — J9811 Atelectasis: Secondary | ICD-10-CM | POA: Diagnosis not present

## 2016-09-25 DIAGNOSIS — R938 Abnormal findings on diagnostic imaging of other specified body structures: Secondary | ICD-10-CM | POA: Diagnosis not present

## 2016-09-25 DIAGNOSIS — A528 Late syphilis, latent: Secondary | ICD-10-CM | POA: Diagnosis not present

## 2016-09-25 DIAGNOSIS — Z113 Encounter for screening for infections with a predominantly sexual mode of transmission: Secondary | ICD-10-CM | POA: Diagnosis not present

## 2016-09-25 DIAGNOSIS — R918 Other nonspecific abnormal finding of lung field: Secondary | ICD-10-CM | POA: Diagnosis not present

## 2016-09-25 DIAGNOSIS — J431 Panlobular emphysema: Secondary | ICD-10-CM | POA: Diagnosis not present

## 2016-09-25 DIAGNOSIS — D124 Benign neoplasm of descending colon: Secondary | ICD-10-CM | POA: Insufficient documentation

## 2016-09-25 DIAGNOSIS — H2513 Age-related nuclear cataract, bilateral: Secondary | ICD-10-CM | POA: Diagnosis not present

## 2016-09-25 DIAGNOSIS — H401130 Primary open-angle glaucoma, bilateral, stage unspecified: Secondary | ICD-10-CM | POA: Diagnosis not present

## 2016-09-25 DIAGNOSIS — R634 Abnormal weight loss: Secondary | ICD-10-CM | POA: Diagnosis not present

## 2016-09-25 DIAGNOSIS — Z8619 Personal history of other infectious and parasitic diseases: Secondary | ICD-10-CM | POA: Diagnosis not present

## 2016-09-25 DIAGNOSIS — I2510 Atherosclerotic heart disease of native coronary artery without angina pectoris: Secondary | ICD-10-CM | POA: Diagnosis not present

## 2016-09-25 DIAGNOSIS — R05 Cough: Secondary | ICD-10-CM | POA: Diagnosis not present

## 2016-09-25 DIAGNOSIS — R69 Illness, unspecified: Secondary | ICD-10-CM | POA: Diagnosis not present

## 2016-09-25 DIAGNOSIS — Z6820 Body mass index (BMI) 20.0-20.9, adult: Secondary | ICD-10-CM | POA: Diagnosis not present

## 2016-09-26 ENCOUNTER — Encounter: Payer: Self-pay | Admitting: Pulmonary Disease

## 2016-09-26 ENCOUNTER — Other Ambulatory Visit: Payer: Self-pay | Admitting: Family Medicine

## 2016-09-26 ENCOUNTER — Ambulatory Visit (INDEPENDENT_AMBULATORY_CARE_PROVIDER_SITE_OTHER): Payer: Medicare HMO | Admitting: Pulmonary Disease

## 2016-09-26 VITALS — BP 90/68 | HR 81 | Resp 16 | Ht 70.0 in | Wt 145.0 lb

## 2016-09-26 DIAGNOSIS — R918 Other nonspecific abnormal finding of lung field: Secondary | ICD-10-CM

## 2016-09-26 DIAGNOSIS — J42 Unspecified chronic bronchitis: Secondary | ICD-10-CM | POA: Diagnosis not present

## 2016-09-26 DIAGNOSIS — Z9109 Other allergy status, other than to drugs and biological substances: Secondary | ICD-10-CM

## 2016-09-26 DIAGNOSIS — J432 Centrilobular emphysema: Secondary | ICD-10-CM | POA: Diagnosis not present

## 2016-09-26 DIAGNOSIS — J301 Allergic rhinitis due to pollen: Secondary | ICD-10-CM | POA: Diagnosis not present

## 2016-09-26 DIAGNOSIS — F17200 Nicotine dependence, unspecified, uncomplicated: Secondary | ICD-10-CM

## 2016-09-26 DIAGNOSIS — J329 Chronic sinusitis, unspecified: Secondary | ICD-10-CM

## 2016-09-26 DIAGNOSIS — R69 Illness, unspecified: Secondary | ICD-10-CM | POA: Diagnosis not present

## 2016-09-26 DIAGNOSIS — R05 Cough: Secondary | ICD-10-CM

## 2016-09-26 DIAGNOSIS — F17210 Nicotine dependence, cigarettes, uncomplicated: Secondary | ICD-10-CM

## 2016-09-26 DIAGNOSIS — B20 Human immunodeficiency virus [HIV] disease: Secondary | ICD-10-CM

## 2016-09-26 DIAGNOSIS — R059 Cough, unspecified: Secondary | ICD-10-CM

## 2016-09-26 LAB — AFB STAIN

## 2016-09-26 MED ORDER — FLUTICASONE FUROATE-VILANTEROL 100-25 MCG/INH IN AEPB: 1.0000 | INHALATION_SPRAY | Freq: Every day | RESPIRATORY_TRACT | 3 refills | Status: DC

## 2016-09-26 NOTE — Addendum Note (Signed)
Addended by: Shakenya Stoneberg, Satira Anis on: 09/26/2016 05:52 PM   Modules accepted: Orders

## 2016-09-26 NOTE — Patient Instructions (Addendum)
Congratulations on your success in quitting smoking so far. It is very important that you remain abstinent from cigarettes  You may try on and off The Breo to determine whether it helps her symptoms of cough and shortness of breath. I suggest trying on it for a week, then off for a week, etc. until it becomes clear whether or not it is of benefit  Continue Xyzal through the summer and fall  Follow-up in 6-8 weeks with repeat chest x-ray and lung function tests (PFTs most)

## 2016-09-26 NOTE — Telephone Encounter (Signed)
Patient requesting refill of Breo to [facility].

## 2016-09-27 LAB — RESPIRATORY CULTURE OR RESPIRATORY AND SPUTUM CULTURE
Gram Stain: NONE SEEN
Organism ID, Bacteria: NORMAL

## 2016-09-29 ENCOUNTER — Encounter: Payer: Self-pay | Admitting: Family Medicine

## 2016-09-29 ENCOUNTER — Ambulatory Visit (INDEPENDENT_AMBULATORY_CARE_PROVIDER_SITE_OTHER): Payer: Medicare HMO | Admitting: Family Medicine

## 2016-09-29 DIAGNOSIS — J432 Centrilobular emphysema: Secondary | ICD-10-CM

## 2016-09-29 DIAGNOSIS — I700 Atherosclerosis of aorta: Secondary | ICD-10-CM | POA: Diagnosis not present

## 2016-09-29 DIAGNOSIS — I10 Essential (primary) hypertension: Secondary | ICD-10-CM

## 2016-09-29 DIAGNOSIS — I2510 Atherosclerotic heart disease of native coronary artery without angina pectoris: Secondary | ICD-10-CM

## 2016-09-29 DIAGNOSIS — J3089 Other allergic rhinitis: Secondary | ICD-10-CM | POA: Diagnosis not present

## 2016-09-29 DIAGNOSIS — R938 Abnormal findings on diagnostic imaging of other specified body structures: Secondary | ICD-10-CM

## 2016-09-29 DIAGNOSIS — R918 Other nonspecific abnormal finding of lung field: Secondary | ICD-10-CM | POA: Diagnosis not present

## 2016-09-29 DIAGNOSIS — R9389 Abnormal findings on diagnostic imaging of other specified body structures: Secondary | ICD-10-CM

## 2016-09-29 NOTE — Progress Notes (Signed)
BP 124/76   Pulse 97   Temp 97.6 F (36.4 C) (Oral)   Resp 16   Wt 146 lb 6.4 oz (66.4 kg)   SpO2 95%   BMI 21.01 kg/m    Subjective:    Patient ID: Samuel Burnett, male    DOB: [birth year], 56 y.o.   MRN: [id number]  HPI: Samuel Burnett is a 56 y.o. male  Chief Complaint  Patient presents with  . Follow-up    6 month   HPI Patient is here for f/u  He saw the infectious disease on July 19th, Dr. Radonna Ricker; patient instructions reviewed; they did additional sputum tests He also had an HIV viral load test and wants to recheck that every 3-6 months since he is not on medicine Labs in [facility] He noted that he had quit cigarettes Pulse ox 95%  Then he saw the pulmonologist on July 20th, Dr. Alva Garnet He also congratulated patient on quitting smoking He recommended the [facility], on for one week, then off for a week, etc to see if helpful with symptoms; xyzal was recommended through summer and fall He will go back in 6-8 weeks for repeat PFTs and CXR He is on the [place] and can tell it is helping No wheezing No coughing, just every now and then; woke up with dry mouth and has water beside the bed Pulse ox 98%  He really did quit cigarettes since last visit  Allergic to dust and grass and pollen and tomatoes; on xyzal  CAD noted on scan, but no chest pain; cannot take aspirin; on statin; already saw cardiologist  Abnormal CT scan; weight loss, poor appetite, no night sweats; no fevers  He is having pain in the lower back; hips; we referred him to an orthopaedist; they went by his previous films; saw arthritis, "she about had a fit", "how are you walking"; they wanted to do non-narcotics; not interested in PT, "I've been through that" I offered a TENS unit and he already has that  Depression screen [facility] 2/9 09/29/2016 09/22/2016 04/02/2016 12/20/2015 11/19/2015  Decreased Interest 0 0 0 0 0  Down, Depressed, Hopeless 0 0 0 0 0  PHQ - 2 Score 0 0 0 0 0    Relevant past  medical, surgical, family and social history reviewed Past Medical History:  Diagnosis Date  . Allergy   . Controlled insomnia   . Eczema   . Emphysema of lung ([place]) 09/22/2016  . HIV infection ([place])   . Hyperlipidemia   . Hypertension   . Numbness in right leg    outside of right foot, related to medical device implant in spine  . Osteoarthritis of lumbar spine    also- hips  . Other forms of scoliosis, thoracolumbar region   . Restless leg syndrome   . Syphilis    Past Surgical History:  Procedure Laterality Date  . COLONOSCOPY WITH PROPOFOL N/A 12/01/2014   Procedure: COLONOSCOPY WITH PROPOFOL;  Surgeon: [name], MD;  Location: [place];  Service: Endoscopy;  Laterality: N/A;  . EYE SURGERY  age 47   uncross eyes  . POLYPECTOMY  12/01/2014   Procedure: POLYPECTOMY;  Surgeon: [name], MD;  Location: [place];  Service: Endoscopy;;  . SPINAL CORD STIMULATOR IMPLANT    . SPINE SURGERY  01/21/12   implanted medtronics SCS   Family History  Problem Relation Age of Onset  . Lupus Mother   . Cancer Father  Pancreatis  . Diabetes Sister   . Diabetes Brother    Social History   Social History  . Marital status: Single    Spouse name: N/A  . Number of children: N/A  . Years of education: N/A   Occupational History  . Not on file.   Social History Main Topics  . Smoking status: Former Smoker    Packs/day: 1.00    Years: 30.00    Types: Cigarettes    Start date: 09/11/1984    Quit date: 11/08/2015  . Smokeless tobacco: Never Used  . Alcohol use 0.0 oz/week     Comment: may drink on "special occasions"  . Drug use: No  . Sexual activity: Yes   Other Topics Concern  . Not on file   Social History Narrative  . No narrative on file  MD note: patient confirms that he has quit smoking since last visit  Interim medical history since last visit reviewed. Allergies and medications reviewed  Review of Systems Per HPI unless  specifically indicated above     Objective:    BP 124/76   Pulse 97   Temp 97.6 F (36.4 C) (Oral)   Resp 16   Wt 146 lb 6.4 oz (66.4 kg)   SpO2 95%   BMI 21.01 kg/m   Wt Readings from Last 3 Encounters:  09/29/16 146 lb 6.4 oz (66.4 kg)  09/26/16 145 lb (65.8 kg)  09/22/16 152 lb 12.8 oz (69.3 kg)    Physical Exam  Constitutional: He appears well-developed and well-nourished. No distress.  Weight loss since last visit noted  HENT:  Head: Normocephalic and atraumatic.  Eyes: No scleral icterus.  Neck: No JVD present. No thyromegaly present.  Cardiovascular: Normal rate and regular rhythm.   Pulmonary/Chest: Effort normal and breath sounds normal. No respiratory distress. He has no wheezes. He has no rales.  Abdominal: He exhibits no distension.  Neurological: He is alert. Coordination normal.  Skin: Skin is warm and dry. No pallor.  Psychiatric: He has a normal mood and affect. His behavior is normal. Judgment and thought content normal.    Results for orders placed or performed in visit on 09/23/16  AFB stain  Result Value Ref Range   Source SPUTUM    Smear     Respiratory or Resp and Sputum Culture  Result Value Ref Range   Gram Stain No WBC Seen    Gram Stain Few Squamous Epithelial Cells Present    Gram Stain Abundant GRAM POSITIVE COCCI IN CLUSTERS    Organism ID, Bacteria Normal Oropharyngeal Flora       Assessment & Plan:   Problem List Items Addressed This Visit      Cardiovascular and Mediastinum   Hypertension goal BP (blood pressure) < 140/90    Well-controlled      CAD (coronary artery disease)    Goal LDL less than 70; cannot take aspirin      Aortic atherosclerosis (HCC)    Avoid saturated fats in fatty meats; check fasting labs in late Sept; goal LDL is less than 70        Respiratory   Emphysema of lung ([place])    Improvement with Breo, seeing pulmonologist      Allergic rhinitis    Continue allergy medicine through summer and fall;  allergic to everything        Other   Ground glass opacity present on imaging of lung    Due for f/u CT scan chest October  2018      Relevant Orders   CT Chest Wo Contrast   Abnormal CT of the chest    Followed by pulmonologist      Relevant Orders   CT Chest Wo Contrast       Follow up plan: Return in about 2 months (around 12/04/2016) for fasting labs only.  An after-visit summary was printed and given to the patient at [facility].  Please see the patient instructions which may contain other information and recommendations beyond what is mentioned above in the assessment and plan.  No orders of the defined types were placed in this encounter.   Orders Placed This Encounter  Procedures  . CT Chest Wo Contrast

## 2016-09-29 NOTE — Assessment & Plan Note (Signed)
Improvement with [place], seeing pulmonologist

## 2016-09-29 NOTE — Assessment & Plan Note (Signed)
Due for f/u CT scan chest October 2018

## 2016-09-29 NOTE — Patient Instructions (Signed)
Try turmeric as a natural anti-inflammatory (for pain and arthritis). It comes in capsules where you buy aspirin and fish oil, but also as a spice where you buy pepper and garlic powder. You will be due for your next chest CT in October You will be due for your next fasting labs in late September

## 2016-09-29 NOTE — Assessment & Plan Note (Signed)
Followed by pulmonologist 

## 2016-09-29 NOTE — Assessment & Plan Note (Signed)
Well controlled 

## 2016-09-29 NOTE — Assessment & Plan Note (Signed)
Continue allergy medicine through summer and fall; allergic to everything

## 2016-09-29 NOTE — Assessment & Plan Note (Signed)
Goal LDL less than 70; cannot take aspirin

## 2016-09-29 NOTE — Assessment & Plan Note (Signed)
Avoid saturated fats in fatty meats; check fasting labs in late Sept; goal LDL is less than 70

## 2016-09-30 ENCOUNTER — Ambulatory Visit: Payer: Medicare HMO | Admitting: Family Medicine

## 2016-09-30 NOTE — Progress Notes (Addendum)
PULMONARY CONSULT NOTE  Requesting MD/Service: Lada Date of initial consultation: 09/26/16 Reason for consultation:  HIV positive. Pulmonary infiltrates  PT PROFILE: 56 y.o. male smoker with HIV, not on anti-retroviral therapy referred for evaluation of pulmonary infiltrates seen on CT scan of chest  DATA: LDCT chest 01/15/16: moderate bilateral emphysematous changes, upper lobe predominance. 3 mm right upper lobe nodule. Parenchymal bands in the medial right middle lobe, lingula and bilateral lower lobes are most consistent with postinfectious/ postinflammatory scarring CT chest 06/02/16: No acute findings in the thorax  CT chest 09/22/16: Interval development of focal ground-glass attenuation in the central left upper lobe. Imaging features are likely infectious/inflammatory  HPI:  As above. He is not very forthcoming with history and was unable to say precisely why he was referred to me. He has HIV according to records in [place] but did not offer this information on our intake form. I discovered this after the encounter. Records indicate a CD4 count of 759 on 09/22/16. Per his report, he is referred for persistent sinus symptoms and cough since mid June. He also reports mild exertional dyspnea.He has been treated with antibiotics and a cough suppressant which transiently improved his symptoms. However, several days after completion of these therapies, his symptoms relapsed. His cough is worse during the night compared to day. Is productive of clear phlegm. He notes that during the couple days leading up to this encounter his cough is much improved. In evaluation of his symptoms, he underwent a CT scan of his chest on 09/22/16 with results as noted above. The findings on that CT scan appear to be a major reason why he is undergoing this current evaluation. He reports a history of childhood asthma but has not had problems with that as an adult. He quit smoking 2 weeks prior to this encounter. He denies  fever, hemoptysis, pleuritic chest pain, lower extremity edema and calf tenderness.  Past Medical History:  Diagnosis Date  . Allergy   . Controlled insomnia   . Eczema   . Emphysema of lung ([place]) 09/22/2016  . HIV infection ([place])   . Hyperlipidemia   . Hypertension   . Numbness in right leg    outside of right foot, related to medical device implant in spine  . Osteoarthritis of lumbar spine    also- hips  . Other forms of scoliosis, thoracolumbar region   . Restless leg syndrome   . Syphilis     Past Surgical History:  Procedure Laterality Date  . COLONOSCOPY WITH PROPOFOL N/A 12/01/2014   Procedure: COLONOSCOPY WITH PROPOFOL;  Surgeon: [name], MD;  Location: [place];  Service: Endoscopy;  Laterality: N/A;  . EYE SURGERY  age 68   uncross eyes  . POLYPECTOMY  12/01/2014   Procedure: POLYPECTOMY;  Surgeon: [name], MD;  Location: [place];  Service: Endoscopy;;  . SPINAL CORD STIMULATOR IMPLANT    . SPINE SURGERY  01/21/12   implanted medtronics SCS    MEDICATIONS: I have reviewed all medications and confirmed regimen as documented  Social History   Social History  . Marital status: Single    Spouse name: N/A  . Number of children: N/A  . Years of education: N/A   Occupational History  . Not on file.   Social History Main Topics  . Smoking status: Former Smoker    Packs/day: 1.00    Years: 30.00    Types: Cigarettes    Start date: 09/11/1984    Quit date:  09/12/16  . Smokeless tobacco: Never Used  . Alcohol use 0.0 oz/week     Comment: may drink on "special occasions"  . Drug use: No  . Sexual activity: Yes   Other Topics Concern  . Not on file   Social History Narrative  . No narrative on file    Family History  Problem Relation Age of Onset  . Lupus Mother   . Cancer Father        Pancreatis  . Diabetes Sister   . Diabetes Brother     ROS: No fever, myalgias/arthralgias, unexplained weight loss or weight gain No  new focal weakness or sensory deficits No otalgia, hearing loss, visual changes, nasal and sinus symptoms, mouth and throat problems No neck pain or adenopathy No abdominal pain, N/V/D, diarrhea, change in bowel pattern No dysuria, change in urinary pattern   Vitals:   09/26/16 1321  BP: 90/68  Pulse: 81  Resp: 16  SpO2: 98%  Weight: 145 lb (65.8 kg)  Height: 5\' 10"  (1.778 m)     EXAM:  Gen: WDWN, No overt respiratory distress HEENT: NCAT, sclera white, oropharynx normal Neck: Supple without LAN, thyromegaly, JVD Lungs: breath sounds slightly coarse and mildly diminished without wheezes or other adventitious sounds Cardiovascular: RRR, no murmurs noted Abdomen: Soft, nontender, normal BS Ext: without clubbing, cyanosis, edema Neuro: CNs grossly intact, motor and sensory intact Skin: Limited exam, no lesions noted  DATA:   BMP Latest Ref Rng & Units 04/02/2016 11/19/2015 11/15/2014  Glucose 65 - 99 mg/dL 91 95 112(H)  BUN 7 - 25 mg/dL 11 12 8   Creatinine 0.70 - 1.33 mg/dL 1.15 1.10 0.90  BUN/Creat Ratio 9 - 20 - - 9  Sodium 135 - 146 mmol/L 138 138 139  Potassium 3.5 - 5.3 mmol/L 5.0 4.9 5.0  Chloride 98 - 110 mmol/L 103 103 101  CO2 20 - 31 mmol/L 29 25 26   Calcium 8.6 - 10.3 mg/dL 9.7 9.6 9.7    CBC Latest Ref Rng & Units 09/22/2016 09/22/2016 04/02/2016  WBC 3.4 - 10.8 x10E3/uL 4.4 4.6 4.2  Hemoglobin 13.0 - 17.7 g/dL 14.0 13.9 15.2  Hematocrit 37.5 - 51.0 % 42.2 41.6 45.2  Platelets 150 - 379 x10E3/uL 204 212 218    CXR (09/19/16):  Emphysematous changes, vague left upper lobe ill-defined opacity  IMPRESSION:     ICD-10-CM   1. Smoker F17.200 DG Chest 2 View    Pulmonary Function Test [facility] Only  2. Emphysema-appears to be moderate by CT chest J43.2 DG Chest 2 View    Pulmonary Function Test [facility] Only  3. Chronic bronchitis, unspecified chronic bronchitis type ([place]) J42 DG Chest 2 View    Pulmonary Function Test [facility] Only  4. Opacity of lung on imaging study  R91.8 DG Chest 2 View    Pulmonary Function Test [facility] Only  5. Pollen allergies J30.1   6. Rhinosinusitis J32.9   7. HIV (human immunodeficiency virus infection) ([place]) B20    With a CD4 count of greater than 700, the likelihood of an opportunistic infection seems very small. Therefore, the left upper lobe pulmonary infiltrate to not require more aggressive diagnostic intervention at this time. It will have to be followed to completion by chest x-ray.  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 He seems to have an allergic component to these symptoms as he reports sensitivity to pollens in the environment  PLAN:  I congratulated him on his success quitting smoking so far. I emphasized the importance of complete abstinence from cigarettes.  He is to continue his Xyzal through the summer and fall months for presumed pollen allergy  I provided samples of Breo inhaler. He may try on and off this to determine whether benefits his cough and shortness of breath . Follow-up in 6-8 weeks with repeat chest x-ray and pulmonary function tests   Merton Border, MD PCCM service Mobile [phone] Pager [phone] 09/30/2016 1:40 PM

## 2016-10-01 ENCOUNTER — Telehealth: Payer: Self-pay | Admitting: Family Medicine

## 2016-10-01 NOTE — Telephone Encounter (Signed)
Patient notified

## 2016-10-01 NOTE — Telephone Encounter (Signed)
I looked through patient's old xrays I am sorry, but I don't think his condition meets the requirements for handicapped parking If he has orthopaedist he follows with who believes he meets the criteria, he is welcome to have that provider do the paperwork; thank you

## 2016-10-02 NOTE — Assessment & Plan Note (Signed)
Noted on scan, discussed; important to keep LDL under 70, smoking cessation key

## 2016-10-02 NOTE — Assessment & Plan Note (Signed)
Not on anti-retrovirals; no recent CD4 count or viral load; message left for ID specialist; patient is at risk for opportunistic infection

## 2016-10-02 NOTE — Assessment & Plan Note (Signed)
Reviewed with patient; rescan in 3 months; pulm, ID involved

## 2016-10-06 ENCOUNTER — Other Ambulatory Visit: Payer: Self-pay

## 2016-10-06 MED ORDER — GABAPENTIN 400 MG PO CAPS: 400.0000 mg | ORAL_CAPSULE | Freq: Three times a day (TID) | ORAL | 1 refills | Status: DC

## 2016-10-08 DIAGNOSIS — R7303 Prediabetes: Secondary | ICD-10-CM | POA: Diagnosis not present

## 2016-10-08 DIAGNOSIS — R69 Illness, unspecified: Secondary | ICD-10-CM | POA: Diagnosis not present

## 2016-10-08 DIAGNOSIS — Z113 Encounter for screening for infections with a predominantly sexual mode of transmission: Secondary | ICD-10-CM | POA: Diagnosis not present

## 2016-10-08 DIAGNOSIS — M5441 Lumbago with sciatica, right side: Secondary | ICD-10-CM | POA: Diagnosis not present

## 2016-10-08 DIAGNOSIS — L219 Seborrheic dermatitis, unspecified: Secondary | ICD-10-CM | POA: Diagnosis not present

## 2016-10-08 DIAGNOSIS — E559 Vitamin D deficiency, unspecified: Secondary | ICD-10-CM | POA: Diagnosis not present

## 2016-10-08 DIAGNOSIS — B190 Unspecified viral hepatitis with hepatic coma: Secondary | ICD-10-CM | POA: Diagnosis not present

## 2016-10-08 DIAGNOSIS — M2550 Pain in unspecified joint: Secondary | ICD-10-CM | POA: Diagnosis not present

## 2016-10-08 DIAGNOSIS — Z1329 Encounter for screening for other suspected endocrine disorder: Secondary | ICD-10-CM | POA: Diagnosis not present

## 2016-10-08 DIAGNOSIS — E785 Hyperlipidemia, unspecified: Secondary | ICD-10-CM | POA: Diagnosis not present

## 2016-10-08 DIAGNOSIS — I10 Essential (primary) hypertension: Secondary | ICD-10-CM | POA: Diagnosis not present

## 2016-10-08 DIAGNOSIS — N529 Male erectile dysfunction, unspecified: Secondary | ICD-10-CM | POA: Diagnosis not present

## 2016-10-16 DIAGNOSIS — I10 Essential (primary) hypertension: Secondary | ICD-10-CM | POA: Diagnosis not present

## 2016-10-16 DIAGNOSIS — G8929 Other chronic pain: Secondary | ICD-10-CM | POA: Diagnosis not present

## 2016-10-16 DIAGNOSIS — R69 Illness, unspecified: Secondary | ICD-10-CM | POA: Diagnosis not present

## 2016-10-23 ENCOUNTER — Other Ambulatory Visit: Payer: Self-pay

## 2016-10-23 DIAGNOSIS — F17210 Nicotine dependence, cigarettes, uncomplicated: Secondary | ICD-10-CM

## 2016-10-23 NOTE — Telephone Encounter (Signed)
Patient requesting refill of Breo to [facility].

## 2016-10-23 NOTE — Telephone Encounter (Signed)
Spoke with the pt and he states that he has 4 refill already. Spoke to the pharmacist and she states it was a mistake on their end.

## 2016-10-23 NOTE — Telephone Encounter (Signed)
I don't prescribe that Please ask him to contact his pulmonologist Thank you

## 2016-10-24 ENCOUNTER — Other Ambulatory Visit: Payer: Self-pay | Admitting: Family Medicine

## 2016-10-24 DIAGNOSIS — R05 Cough: Secondary | ICD-10-CM

## 2016-10-24 DIAGNOSIS — R059 Cough, unspecified: Secondary | ICD-10-CM

## 2016-11-04 ENCOUNTER — Ambulatory Visit: Payer: Medicare HMO | Attending: Pulmonary Disease

## 2016-11-04 DIAGNOSIS — R69 Illness, unspecified: Secondary | ICD-10-CM | POA: Diagnosis not present

## 2016-11-04 DIAGNOSIS — J432 Centrilobular emphysema: Secondary | ICD-10-CM

## 2016-11-04 DIAGNOSIS — R938 Abnormal findings on diagnostic imaging of other specified body structures: Secondary | ICD-10-CM | POA: Insufficient documentation

## 2016-11-04 DIAGNOSIS — F17200 Nicotine dependence, unspecified, uncomplicated: Secondary | ICD-10-CM

## 2016-11-04 DIAGNOSIS — R918 Other nonspecific abnormal finding of lung field: Secondary | ICD-10-CM

## 2016-11-04 DIAGNOSIS — J42 Unspecified chronic bronchitis: Secondary | ICD-10-CM | POA: Diagnosis not present

## 2016-11-04 DIAGNOSIS — J439 Emphysema, unspecified: Secondary | ICD-10-CM | POA: Diagnosis not present

## 2016-11-04 DIAGNOSIS — Z87891 Personal history of nicotine dependence: Secondary | ICD-10-CM | POA: Diagnosis present

## 2016-11-11 ENCOUNTER — Ambulatory Visit
Admission: RE | Admit: 2016-11-11 | Discharge: 2016-11-11 | Disposition: A | Discharge status: Home / Self Care | Payer: Medicare HMO | Source: Ambulatory Visit | Attending: Pulmonary Disease | Admitting: Pulmonary Disease

## 2016-11-11 ENCOUNTER — Ambulatory Visit: Payer: Medicare HMO | Admitting: Pulmonary Disease

## 2016-11-11 DIAGNOSIS — J42 Unspecified chronic bronchitis: Secondary | ICD-10-CM

## 2016-11-11 DIAGNOSIS — R918 Other nonspecific abnormal finding of lung field: Secondary | ICD-10-CM

## 2016-11-11 DIAGNOSIS — J449 Chronic obstructive pulmonary disease, unspecified: Secondary | ICD-10-CM | POA: Diagnosis not present

## 2016-11-11 DIAGNOSIS — J432 Centrilobular emphysema: Secondary | ICD-10-CM | POA: Diagnosis present

## 2016-11-11 DIAGNOSIS — R0602 Shortness of breath: Secondary | ICD-10-CM | POA: Diagnosis not present

## 2016-11-11 DIAGNOSIS — F17200 Nicotine dependence, unspecified, uncomplicated: Secondary | ICD-10-CM | POA: Diagnosis present

## 2016-11-12 ENCOUNTER — Encounter: Payer: Self-pay | Admitting: Pulmonary Disease

## 2016-11-18 DIAGNOSIS — N529 Male erectile dysfunction, unspecified: Secondary | ICD-10-CM | POA: Diagnosis not present

## 2016-12-05 ENCOUNTER — Other Ambulatory Visit: Payer: Self-pay | Admitting: Family Medicine

## 2016-12-07 ENCOUNTER — Other Ambulatory Visit: Payer: Self-pay | Admitting: Family Medicine

## 2016-12-07 DIAGNOSIS — J00 Acute nasopharyngitis [common cold]: Secondary | ICD-10-CM

## 2016-12-07 NOTE — Telephone Encounter (Signed)
I gave Rx for xyzal for acute rhinitis, please call patient and ask if he would like to use as a daily/PRN med for allergic rhinitis - if so, please forward Rx request to Dr. Sanda Klein as she is PCP and I will defer to her. Thanks!

## 2016-12-11 DIAGNOSIS — R05 Cough: Secondary | ICD-10-CM | POA: Diagnosis not present

## 2016-12-11 DIAGNOSIS — R9389 Abnormal findings on diagnostic imaging of other specified body structures: Secondary | ICD-10-CM | POA: Diagnosis not present

## 2016-12-11 DIAGNOSIS — Z79899 Other long term (current) drug therapy: Secondary | ICD-10-CM

## 2016-12-11 DIAGNOSIS — J431 Panlobular emphysema: Secondary | ICD-10-CM | POA: Diagnosis not present

## 2016-12-11 DIAGNOSIS — R69 Illness, unspecified: Secondary | ICD-10-CM | POA: Diagnosis not present

## 2016-12-11 DIAGNOSIS — Z720 Tobacco use: Secondary | ICD-10-CM | POA: Diagnosis not present

## 2016-12-11 DIAGNOSIS — Z113 Encounter for screening for infections with a predominantly sexual mode of transmission: Secondary | ICD-10-CM | POA: Diagnosis not present

## 2016-12-11 DIAGNOSIS — R634 Abnormal weight loss: Secondary | ICD-10-CM | POA: Diagnosis not present

## 2016-12-11 DIAGNOSIS — D72820 Lymphocytosis (symptomatic): Secondary | ICD-10-CM | POA: Diagnosis not present

## 2016-12-11 HISTORY — DX: Other long term (current) drug therapy: Z79.899

## 2016-12-12 DIAGNOSIS — N529 Male erectile dysfunction, unspecified: Secondary | ICD-10-CM | POA: Diagnosis not present

## 2016-12-19 ENCOUNTER — Ambulatory Visit: Payer: Medicare HMO | Admitting: Pulmonary Disease

## 2016-12-26 ENCOUNTER — Ambulatory Visit: Payer: Medicare HMO | Attending: Family Medicine

## 2016-12-30 ENCOUNTER — Other Ambulatory Visit: Payer: Self-pay | Admitting: Internal Medicine

## 2016-12-30 DIAGNOSIS — I77810 Thoracic aortic ectasia: Secondary | ICD-10-CM

## 2016-12-30 DIAGNOSIS — I7090 Unspecified atherosclerosis: Secondary | ICD-10-CM

## 2016-12-30 DIAGNOSIS — E7849 Other hyperlipidemia: Secondary | ICD-10-CM

## 2016-12-30 DIAGNOSIS — I10 Essential (primary) hypertension: Secondary | ICD-10-CM

## 2016-12-31 ENCOUNTER — Other Ambulatory Visit: Payer: Medicare HMO

## 2017-01-09 ENCOUNTER — Other Ambulatory Visit: Payer: Self-pay | Admitting: Family Medicine

## 2017-01-12 ENCOUNTER — Encounter: Payer: Self-pay | Admitting: Pulmonary Disease

## 2017-01-12 ENCOUNTER — Ambulatory Visit
Admission: RE | Admit: 2017-01-12 | Discharge: 2017-01-12 | Disposition: A | Discharge status: Home / Self Care | Payer: Medicare HMO | Source: Ambulatory Visit | Attending: Pulmonary Disease | Admitting: Pulmonary Disease

## 2017-01-12 ENCOUNTER — Ambulatory Visit (INDEPENDENT_AMBULATORY_CARE_PROVIDER_SITE_OTHER): Payer: Medicare HMO | Admitting: Pulmonary Disease

## 2017-01-12 VITALS — BP 90/60 | HR 73 | Resp 16 | Ht 70.0 in | Wt 151.0 lb

## 2017-01-12 DIAGNOSIS — F17200 Nicotine dependence, unspecified, uncomplicated: Secondary | ICD-10-CM

## 2017-01-12 DIAGNOSIS — R918 Other nonspecific abnormal finding of lung field: Secondary | ICD-10-CM

## 2017-01-12 DIAGNOSIS — J449 Chronic obstructive pulmonary disease, unspecified: Secondary | ICD-10-CM | POA: Insufficient documentation

## 2017-01-12 DIAGNOSIS — J4530 Mild persistent asthma, uncomplicated: Secondary | ICD-10-CM

## 2017-01-12 DIAGNOSIS — B20 Human immunodeficiency virus [HIV] disease: Secondary | ICD-10-CM | POA: Diagnosis not present

## 2017-01-12 DIAGNOSIS — R69 Illness, unspecified: Secondary | ICD-10-CM | POA: Diagnosis not present

## 2017-01-12 NOTE — Patient Instructions (Signed)
Continue Breo inhaler Continue albuterol as needed Chest x-ray ordered for today Follow-up in 6 months for routine reevaluation

## 2017-01-12 NOTE — Progress Notes (Signed)
PULMONARY OFFICE FOLLOW-UP NOTE  Requesting MD/Service: Lada Date of initial consultation: 09/26/16 Reason for consultation:  HIV positive. Pulmonary infiltrates  PT PROFILE: 56 y.o. male smoker with HIV, not on anti-retroviral therapy referred for evaluation of pulmonary infiltrates seen on CT scan of chest  DATA: LDCT chest 01/15/16: moderate bilateral emphysematous changes, upper lobe predominance. 3 mm right upper lobe nodule. Parenchymal bands in the medial right middle lobe, lingula and bilateral lower lobes are most consistent with postinfectious/ postinflammatory scarring CT chest 06/02/16: No acute findings in the thorax  CT chest 09/22/16: Interval development of focal ground-glass attenuation in the central left upper lobe. Imaging features are likely infectious/inflammatory PFTs 11/04/16: Spirometry is essentially normal.  There might be very early small airways disease.  Lung volumes normal.  Diffusion capacity lower limit of normal (83% predicted)  Subjective:  Returns today for routine reevaluation.  He reports that he has been doing "great".  He has no respiratory symptoms.  He denies chest pain, cough, fever, hemoptysis.  He has had no unexplained weight loss.  He continues to smoke 1 cigarette/day per his report.  He remains on a Breo and rarely requires rescue albuterol.  Objective Vitals:   01/12/17 1506 01/12/17 1517  BP:  90/60  Pulse:  73  Resp: 16   SpO2:  95%  Weight: 68.5 kg (151 lb)   Height: 5\' 10"  (1.778 m)      EXAM:  Gen: WDWN, No respiratory distress HEENT: NCAT, sclera white, oropharynx normal Neck: Without JVD Lungs: breath sounds full without adventitious sounds Cardiovascular: RRR, no murmurs noted Abdomen: Soft, nontender, normal BS Ext: without clubbing, cyanosis, edema Neuro: Grossly intact  DATA:   BMP Latest Ref Rng & Units 04/02/2016 11/19/2015 11/15/2014  Glucose 65 - 99 mg/dL 91 95 112(H)  BUN 7 - 25 mg/dL 11 12 8   Creatinine 0.70 -  1.33 mg/dL 1.15 1.10 0.90  BUN/Creat Ratio 9 - 20 - - 9  Sodium 135 - 146 mmol/L 138 138 139  Potassium 3.5 - 5.3 mmol/L 5.0 4.9 5.0  Chloride 98 - 110 mmol/L 103 103 101  CO2 20 - 31 mmol/L 29 25 26   Calcium 8.6 - 10.3 mg/dL 9.7 9.6 9.7    CBC Latest Ref Rng & Units 09/22/2016 09/22/2016 04/02/2016  WBC 3.4 - 10.8 x10E3/uL 4.4 4.6 4.2  Hemoglobin 13.0 - 17.7 g/dL 14.0 13.9 15.2  Hematocrit 37.5 - 51.0 % 42.2 41.6 45.2  Platelets 150 - 379 x10E3/uL 204 212 218    CXR (01/12/17): Mild hyperinflation.  The previously seen opacity in the LUL has resolved  IMPRESSION:      1. Smoker  2. HIV disease ([place])  3. Mild persistent asthma without complication  4. LUL opacity resolved     PLAN:  I counseled regarding the need for complete abstinence from cigarettes  Continue Breo inhaler  Continue albuterol inhaler as needed  Follow-up in 6 months for routine reevaluation   Merton Border, MD PCCM service Mobile [phone] Pager [phone] 01/13/2017 3:54 PM

## 2017-01-13 ENCOUNTER — Encounter: Payer: Self-pay | Admitting: Pulmonary Disease

## 2017-01-20 DIAGNOSIS — E559 Vitamin D deficiency, unspecified: Secondary | ICD-10-CM | POA: Diagnosis not present

## 2017-01-20 DIAGNOSIS — I10 Essential (primary) hypertension: Secondary | ICD-10-CM | POA: Diagnosis not present

## 2017-01-20 DIAGNOSIS — R69 Illness, unspecified: Secondary | ICD-10-CM | POA: Diagnosis not present

## 2017-01-20 DIAGNOSIS — G2581 Restless legs syndrome: Secondary | ICD-10-CM | POA: Diagnosis not present

## 2017-01-20 DIAGNOSIS — E782 Mixed hyperlipidemia: Secondary | ICD-10-CM | POA: Diagnosis not present

## 2017-02-06 ENCOUNTER — Other Ambulatory Visit: Payer: Self-pay | Admitting: Internal Medicine

## 2017-02-06 DIAGNOSIS — I77810 Thoracic aortic ectasia: Secondary | ICD-10-CM

## 2017-02-10 ENCOUNTER — Other Ambulatory Visit: Payer: Self-pay

## 2017-02-10 ENCOUNTER — Other Ambulatory Visit: Payer: Medicare HMO

## 2017-02-10 ENCOUNTER — Ambulatory Visit (INDEPENDENT_AMBULATORY_CARE_PROVIDER_SITE_OTHER): Payer: Medicare HMO

## 2017-02-10 DIAGNOSIS — I77810 Thoracic aortic ectasia: Secondary | ICD-10-CM

## 2017-02-25 ENCOUNTER — Other Ambulatory Visit: Payer: Self-pay | Admitting: Family Medicine

## 2017-03-12 ENCOUNTER — Telehealth: Payer: Self-pay

## 2017-03-12 NOTE — Telephone Encounter (Signed)
Called pt, no answer.  LM for pt informing him that we received a notification from [facility] that he had not been filling his pravastatin rx. Informed pt of the need for an appt and labs. CRM created.

## 2017-03-19 DIAGNOSIS — G8929 Other chronic pain: Secondary | ICD-10-CM | POA: Diagnosis not present

## 2017-03-19 DIAGNOSIS — M5442 Lumbago with sciatica, left side: Secondary | ICD-10-CM | POA: Diagnosis not present

## 2017-03-19 DIAGNOSIS — M5441 Lumbago with sciatica, right side: Secondary | ICD-10-CM | POA: Diagnosis not present

## 2017-03-19 DIAGNOSIS — D709 Neutropenia, unspecified: Secondary | ICD-10-CM | POA: Diagnosis not present

## 2017-03-19 DIAGNOSIS — Z79899 Other long term (current) drug therapy: Secondary | ICD-10-CM | POA: Diagnosis not present

## 2017-03-19 DIAGNOSIS — Z23 Encounter for immunization: Secondary | ICD-10-CM | POA: Diagnosis not present

## 2017-03-19 DIAGNOSIS — D72820 Lymphocytosis (symptomatic): Secondary | ICD-10-CM | POA: Diagnosis not present

## 2017-03-19 DIAGNOSIS — Z87891 Personal history of nicotine dependence: Secondary | ICD-10-CM | POA: Diagnosis not present

## 2017-03-19 DIAGNOSIS — R69 Illness, unspecified: Secondary | ICD-10-CM | POA: Diagnosis not present

## 2017-03-19 DIAGNOSIS — R918 Other nonspecific abnormal finding of lung field: Secondary | ICD-10-CM | POA: Diagnosis not present

## 2017-03-19 DIAGNOSIS — Z720 Tobacco use: Secondary | ICD-10-CM | POA: Diagnosis not present

## 2017-03-19 DIAGNOSIS — R634 Abnormal weight loss: Secondary | ICD-10-CM | POA: Diagnosis not present

## 2017-03-31 ENCOUNTER — Telehealth: Payer: Self-pay | Admitting: Family Medicine

## 2017-03-31 DIAGNOSIS — E782 Mixed hyperlipidemia: Secondary | ICD-10-CM

## 2017-03-31 DIAGNOSIS — I252 Old myocardial infarction: Secondary | ICD-10-CM

## 2017-03-31 NOTE — Telephone Encounter (Signed)
Please ask patient to schedule a visit with me on or after June 03, 2017; we'll get his cholesterol panel and other labs at that visit I'll be glad to refill his medicine in the meantime Thank you

## 2017-04-01 NOTE — Telephone Encounter (Signed)
Rx called and canceled

## 2017-04-01 NOTE — Telephone Encounter (Signed)
Spoke with patient and he stated that he is no longer a patient here at [facility]. He asked that we please cancel this order.

## 2017-04-01 NOTE — Telephone Encounter (Signed)
Please cancel the Rx then at the pharmacy; thank you

## 2017-04-02 DIAGNOSIS — Z23 Encounter for immunization: Secondary | ICD-10-CM | POA: Insufficient documentation

## 2017-04-02 HISTORY — DX: Encounter for immunization: Z23

## 2017-04-25 NOTE — Telephone Encounter (Signed)
I discovered an old open note; patient no longer with this practice Closing it out for colleague

## 2017-04-25 NOTE — Progress Notes (Signed)
Closing out lab/order note open since:  March 2018 

## 2017-04-29 ENCOUNTER — Other Ambulatory Visit: Payer: Self-pay | Admitting: Family Medicine

## 2017-06-11 ENCOUNTER — Other Ambulatory Visit: Payer: Self-pay | Admitting: Family Medicine

## 2017-06-12 DIAGNOSIS — L219 Seborrheic dermatitis, unspecified: Secondary | ICD-10-CM | POA: Diagnosis not present

## 2017-06-12 DIAGNOSIS — R69 Illness, unspecified: Secondary | ICD-10-CM | POA: Diagnosis not present

## 2017-06-12 DIAGNOSIS — L309 Dermatitis, unspecified: Secondary | ICD-10-CM | POA: Diagnosis not present

## 2017-06-12 DIAGNOSIS — I10 Essential (primary) hypertension: Secondary | ICD-10-CM | POA: Diagnosis not present

## 2017-06-12 DIAGNOSIS — E782 Mixed hyperlipidemia: Secondary | ICD-10-CM | POA: Diagnosis not present

## 2017-06-16 DIAGNOSIS — N529 Male erectile dysfunction, unspecified: Secondary | ICD-10-CM | POA: Diagnosis not present

## 2017-09-05 ENCOUNTER — Other Ambulatory Visit: Payer: Self-pay | Admitting: Family Medicine

## 2017-09-07 ENCOUNTER — Encounter: Payer: Self-pay | Admitting: Pulmonary Disease

## 2017-09-07 ENCOUNTER — Ambulatory Visit (INDEPENDENT_AMBULATORY_CARE_PROVIDER_SITE_OTHER): Payer: Medicare HMO | Admitting: Pulmonary Disease

## 2017-09-07 VITALS — BP 128/70 | HR 84 | Ht 70.0 in | Wt 152.0 lb

## 2017-09-07 DIAGNOSIS — R69 Illness, unspecified: Secondary | ICD-10-CM | POA: Diagnosis not present

## 2017-09-07 DIAGNOSIS — J4530 Mild persistent asthma, uncomplicated: Secondary | ICD-10-CM

## 2017-09-07 DIAGNOSIS — G2581 Restless legs syndrome: Secondary | ICD-10-CM | POA: Diagnosis not present

## 2017-09-07 DIAGNOSIS — F17210 Nicotine dependence, cigarettes, uncomplicated: Secondary | ICD-10-CM | POA: Diagnosis not present

## 2017-09-07 DIAGNOSIS — L309 Dermatitis, unspecified: Secondary | ICD-10-CM | POA: Diagnosis not present

## 2017-09-07 DIAGNOSIS — G4700 Insomnia, unspecified: Secondary | ICD-10-CM | POA: Diagnosis not present

## 2017-09-07 DIAGNOSIS — J309 Allergic rhinitis, unspecified: Secondary | ICD-10-CM | POA: Diagnosis not present

## 2017-09-07 DIAGNOSIS — E559 Vitamin D deficiency, unspecified: Secondary | ICD-10-CM | POA: Diagnosis not present

## 2017-09-07 DIAGNOSIS — I951 Orthostatic hypotension: Secondary | ICD-10-CM | POA: Diagnosis not present

## 2017-09-07 DIAGNOSIS — J439 Emphysema, unspecified: Secondary | ICD-10-CM | POA: Diagnosis not present

## 2017-09-07 DIAGNOSIS — E785 Hyperlipidemia, unspecified: Secondary | ICD-10-CM | POA: Diagnosis not present

## 2017-09-07 MED ORDER — ALBUTEROL SULFATE HFA 108 (90 BASE) MCG/ACT IN AERS: 1.0000 | INHALATION_SPRAY | RESPIRATORY_TRACT | 5 refills | Status: DC | PRN

## 2017-09-07 MED ORDER — FLUTICASONE FUROATE-VILANTEROL 100-25 MCG/INH IN AEPB: 1.0000 | INHALATION_SPRAY | Freq: Every day | RESPIRATORY_TRACT | 3 refills | Status: DC

## 2017-09-07 NOTE — Progress Notes (Signed)
This was my patient at [facility] but ive moved   [place]

## 2017-09-07 NOTE — Patient Instructions (Signed)
Continue Breo inhaler.  You may remain off his medications during the winter months when your asthma is not a problem  Continue albuterol inhaler as needed  Follow-up in 6 months or sooner as needed

## 2017-09-07 NOTE — Progress Notes (Signed)
PULMONARY OFFICE FOLLOW-UP NOTE  Requesting MD/Service: Lada Date of initial consultation: 09/26/16 Reason for consultation:  HIV positive. Pulmonary infiltrates  PT PROFILE: 57 y.o. male smoker with HIV, not on anti-retroviral therapy referred for evaluation of pulmonary infiltrates seen on CT scan of chest  DATA: LDCT chest 01/15/16: moderate bilateral emphysematous changes, upper lobe predominance. 3 mm right upper lobe nodule. Parenchymal bands in the medial right middle lobe, lingula and bilateral lower lobes are most consistent with postinfectious/ postinflammatory scarring CT chest 06/02/16: No acute findings in the thorax  CT chest 09/22/16: Interval development of focal ground-glass attenuation in the central left upper lobe. Imaging features are likely infectious/inflammatory PFTs 11/04/16: Spirometry is essentially normal.  There might be very early small airways disease.  Lung volumes normal.  Diffusion capacity lower limit of normal (83% predicted)  Subjective:  This is a scheduled office follow-up.  Since last visit, there have been no major pulmonary or respiratory problems.  He continues to smoke intermittently (not even daily).  He is using Breo inhaler intermittently.  He does not have an albuterol inhaler.  He has no new pulmonary complaints  Objective Vitals:   09/07/17 1124 09/07/17 1132  BP:  128/70  Pulse:  84  SpO2:  93%  Weight: 152 lb (68.9 kg)   Height: 5\' 10"  (1.778 m)      EXAM:  Gen: NAD HEENT: NCAT, sclera white Neck: No JVD Lungs: breath sounds full, no wheezes or other adventitious sounds Cardiovascular: RRR, no murmurs Abdomen: Soft, nontender, normal BS Ext: without clubbing, cyanosis, edema Neuro: grossly intact Skin: Limited exam, no lesions noted   DATA:   BMP Latest Ref Rng & Units 04/02/2016 11/19/2015 11/15/2014  Glucose 65 - 99 mg/dL 91 95 112(H)  BUN 7 - 25 mg/dL 11 12 8   Creatinine 0.70 - 1.33 mg/dL 1.15 1.10 0.90  BUN/Creat Ratio  9 - 20 - - 9  Sodium 135 - 146 mmol/L 138 138 139  Potassium 3.5 - 5.3 mmol/L 5.0 4.9 5.0  Chloride 98 - 110 mmol/L 103 103 101  CO2 20 - 31 mmol/L 29 25 26   Calcium 8.6 - 10.3 mg/dL 9.7 9.6 9.7    CBC Latest Ref Rng & Units 09/22/2016 09/22/2016 04/02/2016  WBC 3.4 - 10.8 x10E3/uL 4.4 4.6 4.2  Hemoglobin 13.0 - 17.7 g/dL 14.0 13.9 15.2  Hematocrit 37.5 - 51.0 % 42.2 41.6 45.2  Platelets 150 - 379 x10E3/uL 204 212 218    CXR: No new film  IMPRESSION:   Mild persistent asthma Smoker    PLAN:  I again counseled regarding the need for complete abstinence from cigarettes  Continue Breo inhaler.  He may remain off of this medication during the winter months when his asthma is not a problem  Continue albuterol inhaler as needed.  Prescription refill entered  Follow-up in 6 months or sooner as needed   Merton Border, MD PCCM service Mobile [phone] Pager [phone] 09/07/2017 11:36 AM

## 2017-09-14 ENCOUNTER — Telehealth: Payer: Self-pay | Admitting: Nurse Practitioner

## 2017-09-22 ENCOUNTER — Telehealth: Payer: Self-pay | Admitting: Nurse Practitioner

## 2017-10-04 ENCOUNTER — Other Ambulatory Visit: Payer: Self-pay | Admitting: Family Medicine

## 2017-10-09 ENCOUNTER — Telehealth: Payer: Self-pay | Admitting: *Deleted

## 2017-10-09 NOTE — Telephone Encounter (Signed)
Left message for patient to notify them that it is time to schedule annual low dose lung cancer screening CT scan. Instructed patient to call back to verify information prior to the scan being scheduled.  

## 2017-10-12 ENCOUNTER — Encounter: Payer: Self-pay | Admitting: *Deleted

## 2017-10-12 ENCOUNTER — Other Ambulatory Visit: Payer: Self-pay

## 2017-11-12 DIAGNOSIS — Z79899 Other long term (current) drug therapy: Secondary | ICD-10-CM | POA: Diagnosis not present

## 2017-11-12 DIAGNOSIS — Z87898 Personal history of other specified conditions: Secondary | ICD-10-CM | POA: Diagnosis not present

## 2017-11-12 DIAGNOSIS — Z87891 Personal history of nicotine dependence: Secondary | ICD-10-CM | POA: Diagnosis not present

## 2017-11-12 DIAGNOSIS — Z23 Encounter for immunization: Secondary | ICD-10-CM | POA: Diagnosis not present

## 2017-11-12 DIAGNOSIS — R69 Illness, unspecified: Secondary | ICD-10-CM | POA: Diagnosis not present

## 2017-11-16 DIAGNOSIS — Z862 Personal history of diseases of the blood and blood-forming organs and certain disorders involving the immune mechanism: Secondary | ICD-10-CM | POA: Insufficient documentation

## 2017-11-16 DIAGNOSIS — Z87898 Personal history of other specified conditions: Secondary | ICD-10-CM | POA: Insufficient documentation

## 2017-11-16 HISTORY — DX: Personal history of diseases of the blood and blood-forming organs and certain disorders involving the immune mechanism: Z86.2

## 2017-11-16 HISTORY — DX: Personal history of other specified conditions: Z87.898

## 2017-12-16 ENCOUNTER — Ambulatory Visit: Payer: Medicare HMO | Admitting: Internal Medicine

## 2018-01-19 ENCOUNTER — Encounter: Payer: Self-pay | Admitting: Internal Medicine

## 2018-01-19 ENCOUNTER — Ambulatory Visit (INDEPENDENT_AMBULATORY_CARE_PROVIDER_SITE_OTHER): Payer: Medicare HMO

## 2018-01-19 ENCOUNTER — Ambulatory Visit (INDEPENDENT_AMBULATORY_CARE_PROVIDER_SITE_OTHER): Payer: Medicare HMO | Admitting: Internal Medicine

## 2018-01-19 VITALS — BP 120/62 | HR 63 | Temp 97.8°F | Ht 70.0 in | Wt 151.4 lb

## 2018-01-19 DIAGNOSIS — M25551 Pain in right hip: Secondary | ICD-10-CM | POA: Diagnosis not present

## 2018-01-19 DIAGNOSIS — M5136 Other intervertebral disc degeneration, lumbar region: Secondary | ICD-10-CM | POA: Diagnosis not present

## 2018-01-19 DIAGNOSIS — E785 Hyperlipidemia, unspecified: Secondary | ICD-10-CM | POA: Diagnosis not present

## 2018-01-19 DIAGNOSIS — Z21 Asymptomatic human immunodeficiency virus [HIV] infection status: Secondary | ICD-10-CM

## 2018-01-19 DIAGNOSIS — M5416 Radiculopathy, lumbar region: Secondary | ICD-10-CM

## 2018-01-19 DIAGNOSIS — M5441 Lumbago with sciatica, right side: Secondary | ICD-10-CM

## 2018-01-19 DIAGNOSIS — G4700 Insomnia, unspecified: Secondary | ICD-10-CM

## 2018-01-19 DIAGNOSIS — M51379 Other intervertebral disc degeneration, lumbosacral region without mention of lumbar back pain or lower extremity pain: Secondary | ICD-10-CM

## 2018-01-19 DIAGNOSIS — G2581 Restless legs syndrome: Secondary | ICD-10-CM

## 2018-01-19 DIAGNOSIS — M25552 Pain in left hip: Secondary | ICD-10-CM | POA: Diagnosis not present

## 2018-01-19 DIAGNOSIS — I10 Essential (primary) hypertension: Secondary | ICD-10-CM | POA: Insufficient documentation

## 2018-01-19 DIAGNOSIS — M5126 Other intervertebral disc displacement, lumbar region: Secondary | ICD-10-CM | POA: Diagnosis not present

## 2018-01-19 DIAGNOSIS — Z125 Encounter for screening for malignant neoplasm of prostate: Secondary | ICD-10-CM

## 2018-01-19 DIAGNOSIS — B20 Human immunodeficiency virus [HIV] disease: Secondary | ICD-10-CM

## 2018-01-19 DIAGNOSIS — J069 Acute upper respiratory infection, unspecified: Secondary | ICD-10-CM

## 2018-01-19 DIAGNOSIS — G8929 Other chronic pain: Secondary | ICD-10-CM

## 2018-01-19 DIAGNOSIS — M51369 Other intervertebral disc degeneration, lumbar region without mention of lumbar back pain or lower extremity pain: Secondary | ICD-10-CM

## 2018-01-19 DIAGNOSIS — Z1389 Encounter for screening for other disorder: Secondary | ICD-10-CM

## 2018-01-19 DIAGNOSIS — M5137 Other intervertebral disc degeneration, lumbosacral region: Secondary | ICD-10-CM | POA: Diagnosis not present

## 2018-01-19 DIAGNOSIS — F419 Anxiety disorder, unspecified: Secondary | ICD-10-CM

## 2018-01-19 DIAGNOSIS — E559 Vitamin D deficiency, unspecified: Secondary | ICD-10-CM

## 2018-01-19 DIAGNOSIS — F32A Depression, unspecified: Secondary | ICD-10-CM

## 2018-01-19 DIAGNOSIS — I152 Hypertension secondary to endocrine disorders: Secondary | ICD-10-CM | POA: Insufficient documentation

## 2018-01-19 DIAGNOSIS — M5442 Lumbago with sciatica, left side: Secondary | ICD-10-CM

## 2018-01-19 DIAGNOSIS — E1159 Type 2 diabetes mellitus with other circulatory complications: Secondary | ICD-10-CM | POA: Insufficient documentation

## 2018-01-19 DIAGNOSIS — Z1329 Encounter for screening for other suspected endocrine disorder: Secondary | ICD-10-CM

## 2018-01-19 DIAGNOSIS — F329 Major depressive disorder, single episode, unspecified: Secondary | ICD-10-CM

## 2018-01-19 MED ORDER — TRAZODONE HCL 50 MG PO TABS: 50.0000 mg | ORAL_TABLET | Freq: Every evening | ORAL | 3 refills | Status: DC | PRN

## 2018-01-19 MED ORDER — AZITHROMYCIN 250 MG PO TABS: ORAL_TABLET | ORAL | 0 refills | Status: DC

## 2018-01-19 MED ORDER — ROPINIROLE HCL 1 MG PO TABS: 1.0000 mg | ORAL_TABLET | Freq: Every day | ORAL | 3 refills | Status: DC

## 2018-01-19 NOTE — Progress Notes (Signed)
Chief Complaint  Patient presents with  . Establish Care   Former Dr Kelly Services pt at [facility]  1. Chronic low back pain h/o DDD, herniated discs, lumbar radiculopathy s/p spinal stimulator placed 01/19/12 Dr. Mariea Stable model #  [id number], serial # [id number] [place]. Back pain is uncontrolled and at times moderate to severe and stimulator does not always help. Pain radiates to b/l hips and down his legs and he has numbness/tingling.  2. RLS wants refill of requip 1 mg qhs  3. Productive Cough x 1 week w/o fever, chills, body aches. No sick contacts  4. HIV viral load controlled, neg as of 11/12/17 was detected as of 12/11/16 pt is compliant with meds now  f/u [facility] but had not been on medications for years prior ot 2018  5. Chronic insomnia, h/o anxiety and depression-he would like refill of trazadone 6. H/o HTN/HLD on norvasc 2.5 mg qd 1/2 of 5 mg and lipitor vs pravastatin ? Dose and ? Which medication   Review of Systems  Constitutional: Negative for weight loss.  HENT: Negative for hearing loss.   Eyes: Negative for blurred vision.  Respiratory: Negative for shortness of breath.   Cardiovascular: Negative for chest pain.  Gastrointestinal: Negative for abdominal pain.  Musculoskeletal: Positive for back pain.  Skin: Negative for rash.  Neurological: Positive for sensory change.  Psychiatric/Behavioral: Negative for depression. The patient is not nervous/anxious and does not have insomnia.    Past Medical History:  Diagnosis Date  . Allergy   . Controlled insomnia   . Eczema   . Emphysema of lung ([place]) 09/22/2016  . HIV infection ([place])   . Hyperlipidemia   . Hypertension   . Numbness in right leg    outside of right foot, related to medical device implant in spine  . Osteoarthritis of lumbar spine    also- hips  . Other forms of scoliosis, thoracolumbar region   . Restless leg syndrome   . Syphilis    Past Surgical History:  Procedure Laterality Date  . COLONOSCOPY WITH  PROPOFOL N/A 12/01/2014   Procedure: COLONOSCOPY WITH PROPOFOL;  Surgeon: [name], MD;  Location: [facility] [place];  Service: Endoscopy;  Laterality: N/A;  . EYE SURGERY  age 16   uncross eyes  . POLYPECTOMY  12/01/2014   Procedure: POLYPECTOMY;  Surgeon: [name], MD;  Location: [place];  Service: Endoscopy;;  . SPINAL CORD STIMULATOR IMPLANT    . SPINE SURGERY  01/21/12   implanted medtronics SCS   Family History  Problem Relation Age of Onset  . Lupus Mother   . Cancer Father        Pancreatis  . Diabetes Sister   . Diabetes Brother    Social History   Socioeconomic History  . Marital status: Single    Spouse name: Not on file  . Number of children: Not on file  . Years of education: Not on file  . Highest education level: Not on file  Occupational History  . Not on file  Social Needs  . Financial resource strain: Not on file  . Food insecurity:    Worry: Not on file    Inability: Not on file  . Transportation needs:    Medical: Not on file    Non-medical: Not on file  Tobacco Use  . Smoking status: Former Smoker    Packs/day: 1.00    Years: 30.00    Pack years: 30.00    Types: Cigarettes  Start date: 09/11/1984    Last attempt to quit: 11/08/2015    Years since quitting: 2.2  . Smokeless tobacco: Never Used  Substance and Sexual Activity  . Alcohol use: Yes    Alcohol/week: 0.0 standard drinks    Comment: may drink on "special occasions"  . Drug use: No  . Sexual activity: Yes  Lifestyle  . Physical activity:    Days per week: Not on file    Minutes per session: Not on file  . Stress: Not on file  Relationships  . Social connections:    Talks on phone: Not on file    Gets together: Not on file    Attends religious service: Not on file    Active member of club or organization: Not on file    Attends meetings of clubs or organizations: Not on file    Relationship status: Not on file  . Intimate partner violence:    Fear of current  or ex partner: Not on file    Emotionally abused: Not on file    Physically abused: Not on file    Forced sexual activity: Not on file  Other Topics Concern  . Not on file  Social History Narrative  . Not on file   No outpatient medications have been marked as taking for the 01/19/18 encounter (Office Visit) with McLean-Scocuzza, Nino Glow, MD.   Allergies  Allergen Reactions  . Fentanyl Shortness Of Breath, Nausea Only and Palpitations    Other reaction(s): Other (See Comments) sweating  Increasing temp., muscle weakness  . Shellfish Allergy Other (See Comments)    Angioedema Angioedema  . Tomato Other (See Comments)    Angioedema  . Bee Pollen Itching  . Penicillin G Swelling    Other reaction(s): Difficulty breathing  . Pollen Extract   . Aspirin Nausea And Vomiting    Other reaction(s): Nausea And Vomiting  . Dust Mite Extract Rash   No results found for this or any previous visit (from the past 2160 hour(s)). Objective  Body mass index is 21.72 kg/m. Wt Readings from Last 3 Encounters:  01/19/18 151 lb 6.4 oz (68.7 kg)  09/07/17 152 lb (68.9 kg)  01/12/17 151 lb (68.5 kg)   Temp Readings from Last 3 Encounters:  01/19/18 97.8 F (36.6 C) (Oral)  09/29/16 97.6 F (36.4 C) (Oral)  09/22/16 97.6 F (36.4 C) (Oral)   BP Readings from Last 3 Encounters:  01/19/18 120/62  09/07/17 128/70  01/12/17 90/60   Pulse Readings from Last 3 Encounters:  01/19/18 63  09/07/17 84  01/12/17 73    Physical Exam  Constitutional: He is oriented to person, place, and time. Vital signs are normal. He appears well-developed and well-nourished. He is cooperative.  HENT:  Head: Normocephalic and atraumatic.  Mouth/Throat: Oropharynx is clear and moist and mucous membranes are normal.  Eyes: Pupils are equal, round, and reactive to light. Conjunctivae are normal.  Cardiovascular: Normal rate, regular rhythm and normal heart sounds.  Pulmonary/Chest: Effort normal and breath  sounds normal.  Musculoskeletal:       Lumbar back: He exhibits tenderness.  Neurological: He is alert and oriented to person, place, and time. Gait normal.  Skin: Skin is warm, dry and intact.  Psychiatric: He has a normal mood and affect. His speech is normal and behavior is normal. Judgment and thought content normal. Cognition and memory are normal.  Nursing note and vitals reviewed.   Assessment   1. Chronic low back pain h/o  DDD, herniated discs, lumbar radiculopathy s/p spinal stimulator placed 01/19/12 Dr. Mariea Stable model #  [id number], serial # [id number] [place]  H/o b/l hip pain 2. RLS 3. Productive Cough c/w URI vs bacteria  4. HIV viral load controlled, neg as of 11/12/17 was detected as of 12/11/16 pt is compliant with meds f/u [facility]  5. Chronic insomnia, h/o anxiety and depression 6. H/o HTN/HLD 7. HM Plan   1. Will do MRI spine pt able to turn off spinal cord stimulator On cymbalta 60 mg qd  Xray hips today  2.  Refilled requilp 3. zpack  4. F/u [facility]  Cont meds Biktarvy [id number] 1 pill qd  5. Refilled Trazadone  6.  Confirm if pravachol vs lipitor and dose  On norvasc 2.5 mg qd  7. Declines flu shot  utd hep A, prevnar 09/09/12, pna 23 09/29/13, shingrix 11/12/17 1/2  Tdap last 12/20/15  Colonoscopy 12/01/14 Dr. Allen Norris tubular and hyperplastic polyps q5 years  Former smoker  Last eye exam 2018  Hep BsAb 163 08/02/09 HCV neg 09/29/13   Consider check CMET, lipid, vitamin D, UA, TSH, PSA in future   Provider: Dr. Olivia Mackie McLean-Scocuzza-Internal Medicine

## 2018-01-19 NOTE — Patient Instructions (Signed)
Upper Respiratory Infection, Adult Most upper respiratory infections (URIs) are a viral infection of the air passages leading to the lungs. A URI affects the nose, throat, and upper air passages. The most common type of URI is nasopharyngitis and is typically referred to as "the common cold." URIs run their course and usually go away on their own. Most of the time, a URI does not require medical attention, but sometimes a bacterial infection in the upper airways can follow a viral infection. This is called a secondary infection. Sinus and middle ear infections are common types of secondary upper respiratory infections. Bacterial pneumonia can also complicate a URI. A URI can worsen asthma and chronic obstructive pulmonary disease (COPD). Sometimes, these complications can require emergency medical care and may be life threatening. What are the causes? Almost all URIs are caused by viruses. A virus is a type of germ and can spread from one person to another. What increases the risk? You may be at risk for a URI if:  You smoke.  You have chronic heart or lung disease.  You have a weakened defense (immune) system.  You are very young or very old.  You have nasal allergies or asthma.  You work in crowded or poorly ventilated areas.  You work in health care facilities or schools.  What are the signs or symptoms? Symptoms typically develop 2-3 days after you come in contact with a cold virus. Most viral URIs last 7-10 days. However, viral URIs from the influenza virus (flu virus) can last 14-18 days and are typically more severe. Symptoms may include:  Runny or stuffy (congested) nose.  Sneezing.  Cough.  Sore throat.  Headache.  Fatigue.  Fever.  Loss of appetite.  Pain in your forehead, behind your eyes, and over your cheekbones (sinus pain).  Muscle aches.  How is this diagnosed? Your health care provider may diagnose a URI by:  Physical exam.  Tests to check that your  symptoms are not due to another condition such as: ? Strep throat. ? Sinusitis. ? Pneumonia. ? Asthma.  How is this treated? A URI goes away on its own with time. It cannot be cured with medicines, but medicines may be prescribed or recommended to relieve symptoms. Medicines may help:  Reduce your fever.  Reduce your cough.  Relieve nasal congestion.  Follow these instructions at home:  Take medicines only as directed by your health care provider.  Gargle warm saltwater or take cough drops to comfort your throat as directed by your health care provider.  Use a warm mist humidifier or inhale steam from a shower to increase air moisture. This may make it easier to breathe.  Drink enough fluid to keep your urine clear or pale yellow.  Eat soups and other clear broths and maintain good nutrition.  Rest as needed.  Return to work when your temperature has returned to normal or as your health care provider advises. You may need to stay home longer to avoid infecting others. You can also use a face mask and careful hand washing to prevent spread of the virus.  Increase the usage of your inhaler if you have asthma.  Do not use any tobacco products, including cigarettes, chewing tobacco, or electronic cigarettes. If you need help quitting, ask your health care provider. How is this prevented? The best way to protect yourself from getting a cold is to practice good hygiene.  Avoid oral or hand contact with people with cold symptoms.  Wash your   hands often if contact occurs.  There is no clear evidence that vitamin C, vitamin E, echinacea, or exercise reduces the chance of developing a cold. However, it is always recommended to get plenty of rest, exercise, and practice good nutrition. Contact a health care provider if:  You are getting worse rather than better.  Your symptoms are not controlled by medicine.  You have chills.  You have worsening shortness of breath.  You have  brown or red mucus.  You have yellow or brown nasal discharge.  You have pain in your face, especially when you bend forward.  You have a fever.  You have swollen neck glands.  You have pain while swallowing.  You have white areas in the back of your throat. Get help right away if:  You have severe or persistent: ? Headache. ? Ear pain. ? Sinus pain. ? Chest pain.  You have chronic lung disease and any of the following: ? Wheezing. ? Prolonged cough. ? Coughing up blood. ? A change in your usual mucus.  You have a stiff neck.  You have changes in your: ? Vision. ? Hearing. ? Thinking. ? Mood. This information is not intended to replace advice given to you by your health care provider. Make sure you discuss any questions you have with your health care provider. Document Released: 08/20/2000 Document Revised: 10/28/2015 Document Reviewed: 06/01/2013 Elsevier Interactive Patient Education  2018 Elsevier Inc.  

## 2018-01-19 NOTE — Progress Notes (Signed)
Pre visit review using our clinic review tool, if applicable. No additional management support is needed unless otherwise documented below in the visit note. 

## 2018-01-25 ENCOUNTER — Telehealth: Payer: Self-pay

## 2018-01-25 ENCOUNTER — Encounter: Payer: Self-pay | Admitting: Internal Medicine

## 2018-01-25 MED ORDER — AMLODIPINE BESYLATE 2.5 MG PO TABS: 2.5000 mg | ORAL_TABLET | Freq: Every day | ORAL | 3 refills | Status: DC

## 2018-01-25 NOTE — Telephone Encounter (Signed)
Copied from [place] [id number]. Topic: General - Inquiry >> Jan 25, 2018  2:13 PM Conception Chancy, NT wrote: Reason for CRM: patient is calling and states he received the approval letter from his insurance company to schedule his MRI. Please contact patient.

## 2018-01-29 ENCOUNTER — Other Ambulatory Visit: Payer: Self-pay | Admitting: Internal Medicine

## 2018-01-29 ENCOUNTER — Telehealth: Payer: Self-pay | Admitting: Internal Medicine

## 2018-01-29 DIAGNOSIS — J309 Allergic rhinitis, unspecified: Secondary | ICD-10-CM

## 2018-01-29 MED ORDER — MONTELUKAST SODIUM 10 MG PO TABS: 10.0000 mg | ORAL_TABLET | Freq: Every day | ORAL | 2 refills | Status: DC

## 2018-01-29 NOTE — Telephone Encounter (Signed)
Please advise 

## 2018-01-29 NOTE — Telephone Encounter (Signed)
We can add singulair pills as night for allergies with flonase sent to [facility]

## 2018-01-29 NOTE — Progress Notes (Signed)
Patient scheduled appointment for jan 6,2020

## 2018-01-29 NOTE — Telephone Encounter (Signed)
Copied from [place] [id number]. Topic: General - Other >> Jan 29, 2018  1:29 PM Samuel Burnett wrote: Patient is calling in wanting to know if there is anything stronger than fluticasone (FLONASE) 50 MCG/ACT nasal spray that he can use due to med not working now

## 2018-02-03 NOTE — Telephone Encounter (Signed)
We can add singulair pills as night for allergies with flonase sent to [facility] Inform pt   [place]

## 2018-02-08 NOTE — Telephone Encounter (Signed)
Patient has been informed.

## 2018-02-26 IMAGING — CT CT ANGIO CHEST
1 of 2 series · 18 of 32 positions shown · IV contrast (APPLIED)
Comparison: Chest CT 01/15/2016.

CLINICAL DATA: 55-year-old male with chest pain, mild moderate in
intensity, lasting for several minutes at a time, occurring
randomly. No radiation of chest pain. Former smoker (quit 1 month
ago).

EXAM:
CT ANGIOGRAPHY CHEST WITH CONTRAST
TECHNIQUE: Multidetector CT imaging of the chest was performed using the
standard protocol during bolus administration of intravenous
contrast. Multiplanar CT image reconstructions and MIPs were
obtained to evaluate the vascular anatomy.
CONTRAST:  100 mL of Isovue 370.

[Series 4: axial arterial · axial · arterial · 0.73mm/px · z∈[-624,-339]mm · 18 of 107 slices shown]
[im 6/107  lung]
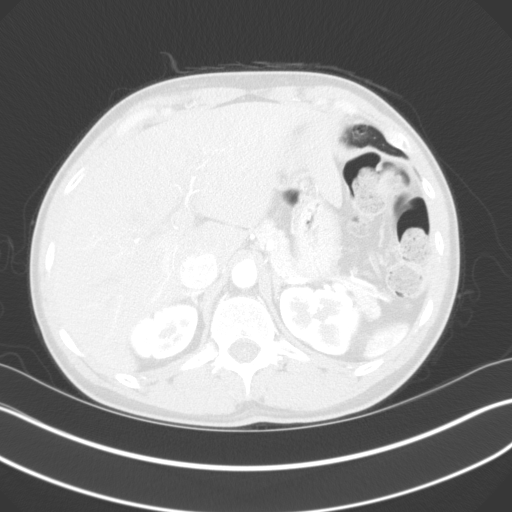
[im 12/107  soft-tissue]
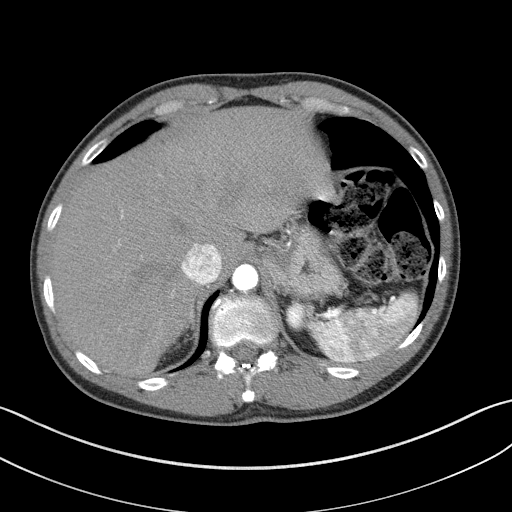
[im 17/107  lung]
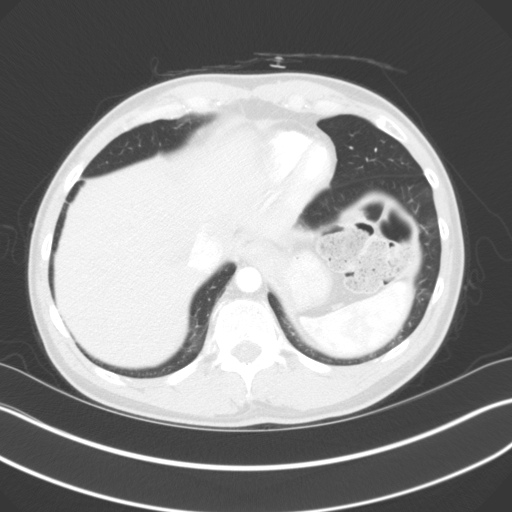
[im 23/107  soft-tissue]
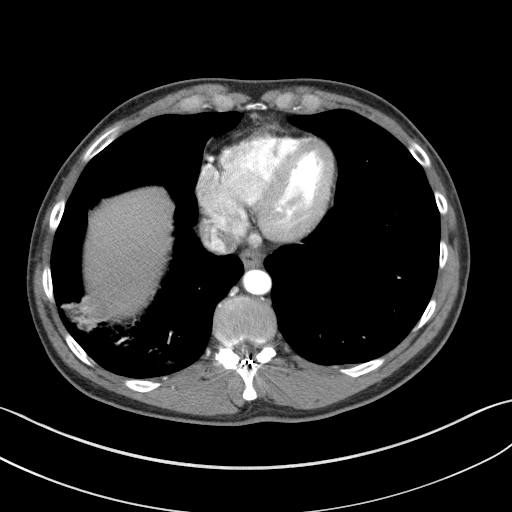
[im 28/107  lung]
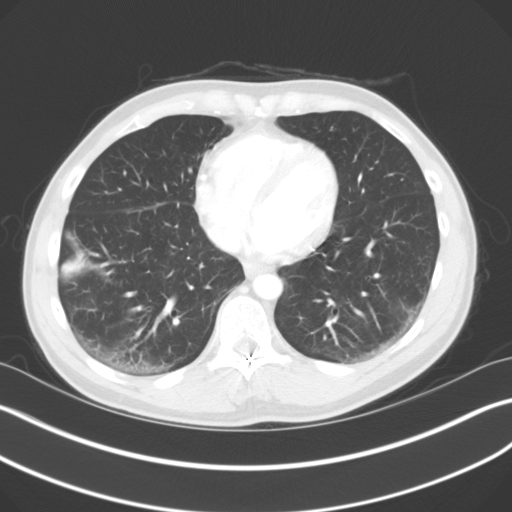
[im 34/107  soft-tissue]
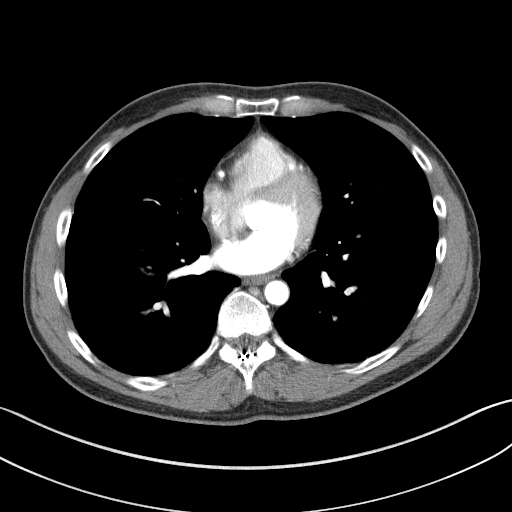
[im 40/107  lung]
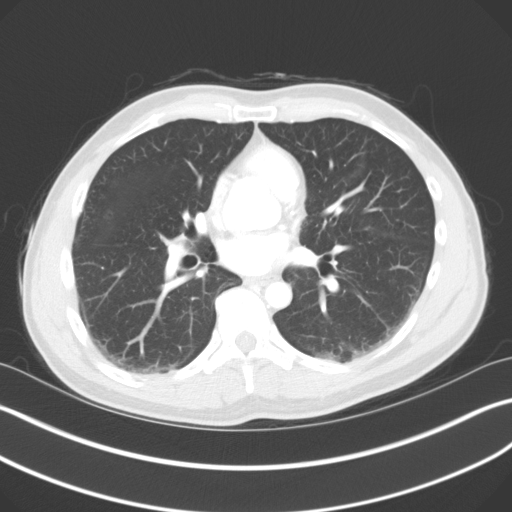
[im 45/107  soft-tissue]
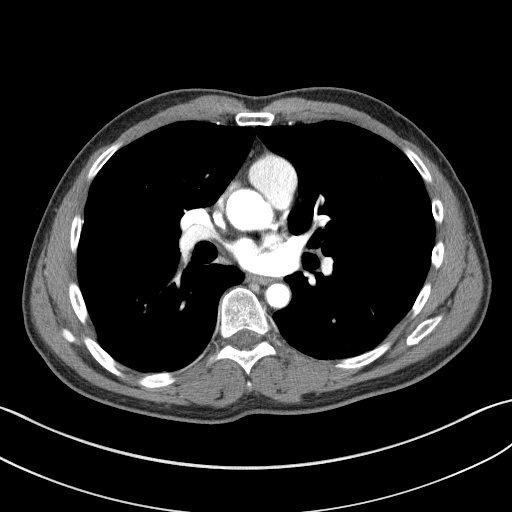
[im 51/107  lung]
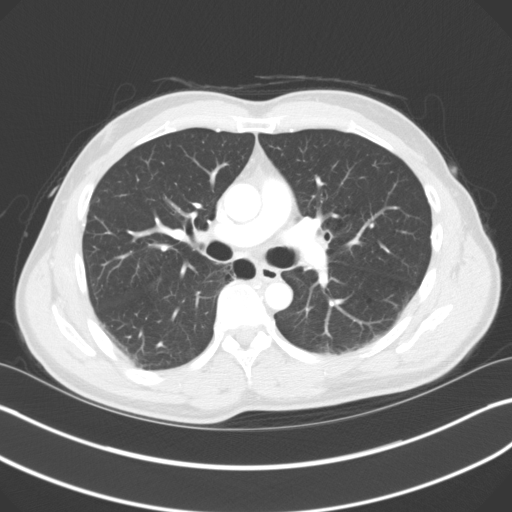
[im 56/107  soft-tissue]
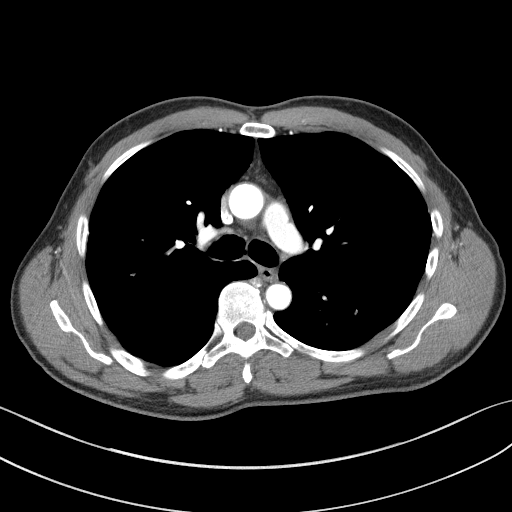
[im 62/107  lung]
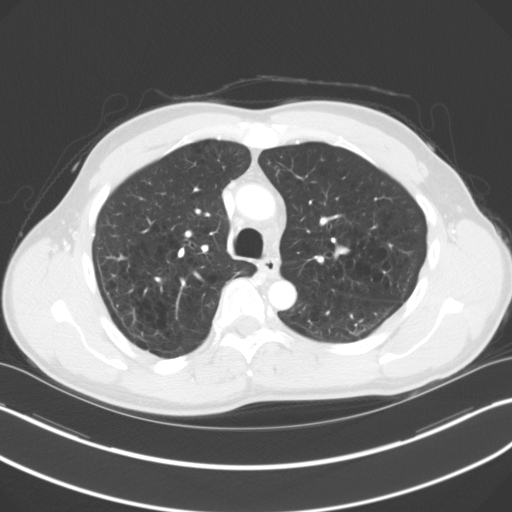
[im 67/107  soft-tissue]
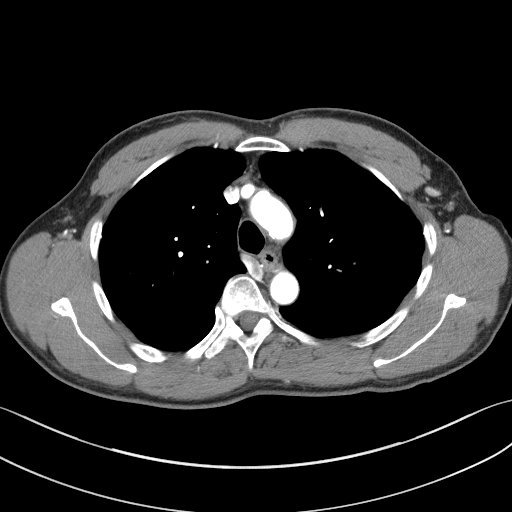
[im 73/107  lung]
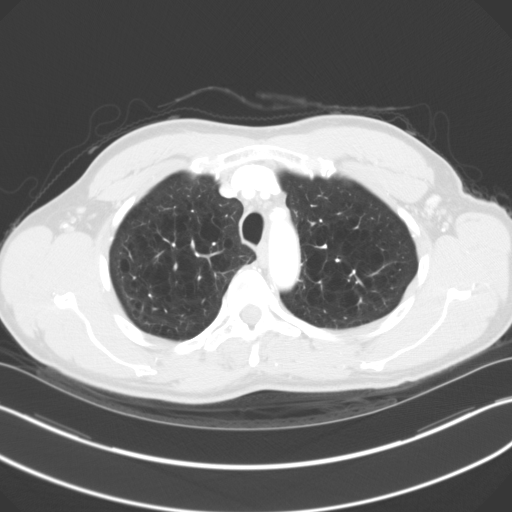
[im 79/107  soft-tissue]
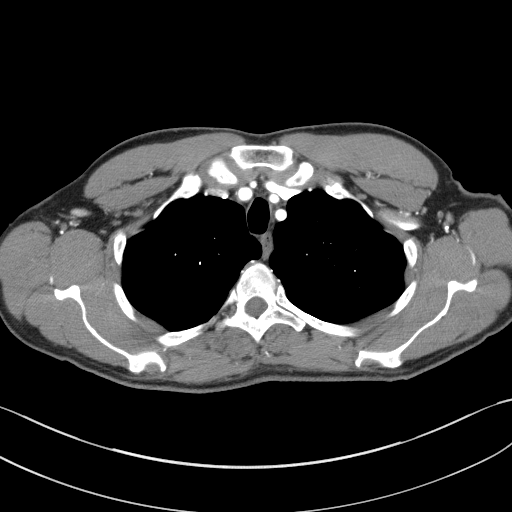
[im 84/107  lung]
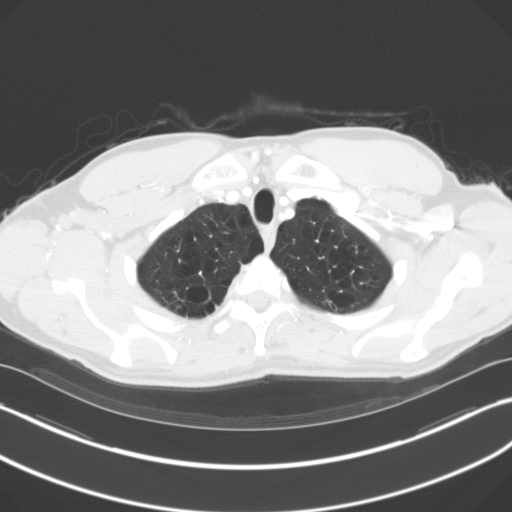
[im 90/107  soft-tissue]
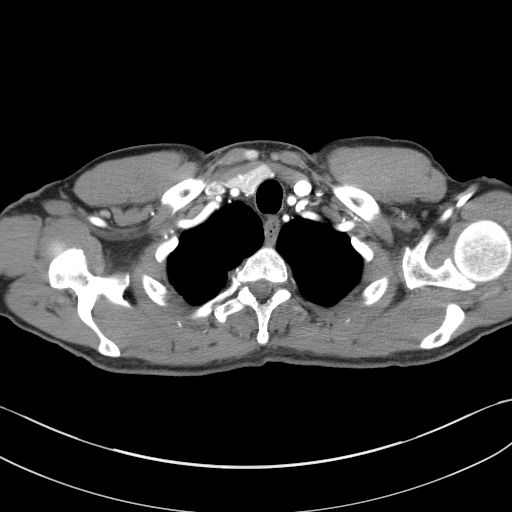
[im 95/107  lung]
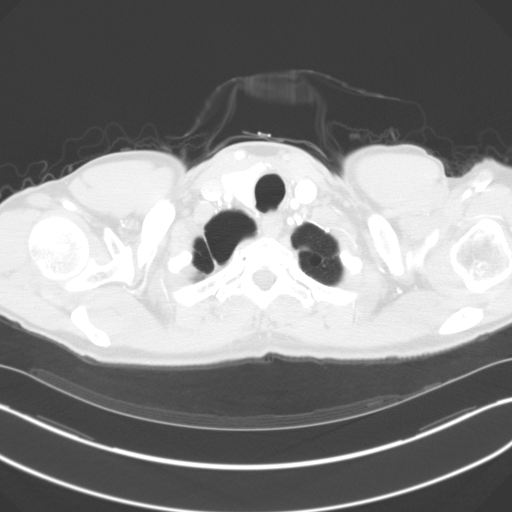
[im 101/107  soft-tissue]
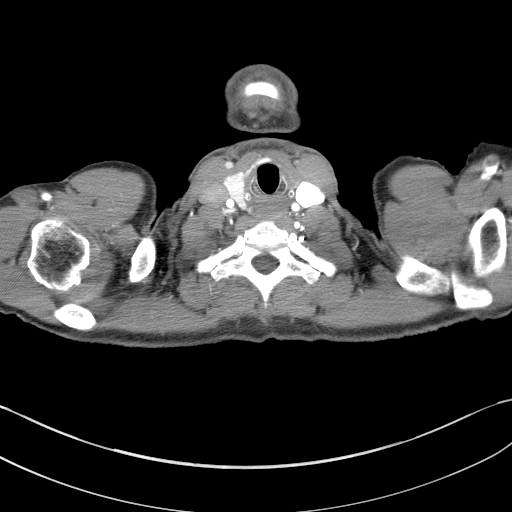

[18 of 32 positions shown; findings below may reference images not displayed]

FINDINGS: Cardiovascular: Heart size is normal. There is no significant
pericardial fluid, thickening or pericardial calcification. There is
aortic atherosclerosis, as well as atherosclerosis of the great
vessels of the mediastinum and the coronary arteries, including
calcified atherosclerotic plaque in the left anterior descending
coronary arteries. Dilatation of the aortic root which measures up
to 4.2 cm in diameter at the sinuses of Valsalva. Ascending thoracic
aorta, aortic arch and descending thoracic aorta are otherwise
normal in caliber measuring 2.6 cm, 2.1 cm and 1.8 cm in diameter
respectively.

Mediastinum/Nodes: No pathologically enlarged mediastinal or hilar
lymph nodes. Left lobe of thyroid gland is either surgically absent
or atrophic.

Lungs/Pleura: No acute consolidative airspace disease. No pleural
effusions. Mild diffuse bronchial wall thickening with moderate
centrilobular and paraseptal emphysema, most severe in the lung
apices. No suspicious appearing pulmonary nodules or masses.

Upper Abdomen: 1.1 cm low-attenuation lesion in segment 4A of the
liver is incompletely visualize, but likely a small cyst.

Musculoskeletal: Spinal stimulator in place in the lower thoracic
region. There are no aggressive appearing lytic or blastic lesions
noted in the visualized portions of the skeleton.

Review of the MIP images confirms the above findings.
IMPRESSION: 1. No acute findings in the thorax to account for the patient's
symptoms.
2. Mild dilatation of the aortic root which measures approximately
4.2 cm in diameter at the level of the sinuses of Valsalva. The
thoracic aorta is otherwise normal in caliber.
3. Mild diffuse bronchial wall thickening with moderate
centrilobular and paraseptal emphysema; imaging findings suggestive
of underlying COPD.

## 2018-03-12 ENCOUNTER — Ambulatory Visit: Payer: Medicare HMO | Admitting: Pulmonary Disease

## 2018-03-12 DIAGNOSIS — R69 Illness, unspecified: Secondary | ICD-10-CM | POA: Diagnosis not present

## 2018-03-15 ENCOUNTER — Encounter: Payer: Self-pay | Admitting: Pulmonary Disease

## 2018-03-15 ENCOUNTER — Other Ambulatory Visit (INDEPENDENT_AMBULATORY_CARE_PROVIDER_SITE_OTHER): Payer: Medicare HMO

## 2018-03-15 ENCOUNTER — Ambulatory Visit (INDEPENDENT_AMBULATORY_CARE_PROVIDER_SITE_OTHER): Payer: Medicare HMO | Admitting: Pulmonary Disease

## 2018-03-15 VITALS — BP 122/76 | HR 101 | Ht 70.0 in | Wt 152.8 lb

## 2018-03-15 DIAGNOSIS — J4520 Mild intermittent asthma, uncomplicated: Secondary | ICD-10-CM | POA: Diagnosis not present

## 2018-03-15 DIAGNOSIS — Z1389 Encounter for screening for other disorder: Secondary | ICD-10-CM

## 2018-03-15 DIAGNOSIS — E785 Hyperlipidemia, unspecified: Secondary | ICD-10-CM

## 2018-03-15 DIAGNOSIS — Z1329 Encounter for screening for other suspected endocrine disorder: Secondary | ICD-10-CM

## 2018-03-15 DIAGNOSIS — E559 Vitamin D deficiency, unspecified: Secondary | ICD-10-CM | POA: Diagnosis not present

## 2018-03-15 DIAGNOSIS — Z125 Encounter for screening for malignant neoplasm of prostate: Secondary | ICD-10-CM | POA: Diagnosis not present

## 2018-03-15 DIAGNOSIS — I10 Essential (primary) hypertension: Secondary | ICD-10-CM

## 2018-03-15 DIAGNOSIS — Z87891 Personal history of nicotine dependence: Secondary | ICD-10-CM | POA: Diagnosis not present

## 2018-03-15 LAB — COMPREHENSIVE METABOLIC PANEL
ALT: 11 U/L (ref 0–53)
AST: 21 U/L (ref 0–37)
Albumin: 4.4 g/dL (ref 3.5–5.2)
Alkaline Phosphatase: 87 U/L (ref 39–117)
BUN: 9 mg/dL (ref 6–23)
CO2: 26 mEq/L (ref 19–32)
Calcium: 9.7 mg/dL (ref 8.4–10.5)
Chloride: 103 mEq/L (ref 96–112)
Creatinine, Ser: 1.03 mg/dL (ref 0.40–1.50)
GFR: 94.88 mL/min (ref >60.00)
Glucose, Bld: 97 mg/dL (ref 70–99)
Potassium: 4.6 mEq/L (ref 3.5–5.1)
Sodium: 139 mEq/L (ref 135–145)
Total Bilirubin: 1.1 mg/dL (ref 0.2–1.2)
Total Protein: 7 g/dL (ref 6.0–8.3)

## 2018-03-15 LAB — CBC WITH DIFFERENTIAL/PLATELET
Basophils Absolute: 0.1 10*3/uL (ref 0.0–0.1)
Basophils Relative: 1.1 % (ref 0.0–3.0)
Eosinophils Absolute: 0.2 10*3/uL (ref 0.0–0.7)
Eosinophils Relative: 3.1 % (ref 0.0–5.0)
HCT: 45.5 % (ref 39.0–52.0)
Hemoglobin: 15.2 g/dL (ref 13.0–17.0)
Lymphocytes Relative: 43.6 % (ref 12.0–46.0)
Lymphs Abs: 2.7 10*3/uL (ref 0.7–4.0)
MCHC: 33.5 g/dL (ref 30.0–36.0)
MCV: 99 fl (ref 78.0–100.0)
Monocytes Absolute: 0.8 10*3/uL (ref 0.1–1.0)
Monocytes Relative: 13.5 % — ABNORMAL HIGH (ref 3.0–12.0)
Neutro Abs: 2.4 10*3/uL (ref 1.4–7.7)
Neutrophils Relative %: 38.7 % — ABNORMAL LOW (ref 43.0–77.0)
Platelets: 199 10*3/uL (ref 150.0–400.0)
RBC: 4.6 Mil/uL (ref 4.22–5.81)
RDW: 14.7 % (ref 11.5–15.5)
WBC: 6.3 10*3/uL (ref 4.0–10.5)

## 2018-03-15 LAB — LIPID PANEL
Cholesterol: 157 mg/dL (ref 0–200)
HDL: 45.4 mg/dL (ref >39.00)
LDL Cholesterol: 92 mg/dL (ref 0–99)
NonHDL: 112.06
Total CHOL/HDL Ratio: 3
Triglycerides: 101 mg/dL (ref 0.0–149.0)
VLDL: 20.2 mg/dL (ref 0.0–40.0)

## 2018-03-15 LAB — PSA: PSA: 0.83 ng/mL (ref 0.10–4.00)

## 2018-03-15 LAB — VITAMIN D 25 HYDROXY (VIT D DEFICIENCY, FRACTURES): VITD: 58.1 ng/mL (ref 30.00–100.00)

## 2018-03-15 LAB — TSH: TSH: 1.48 u[IU]/mL (ref 0.35–4.50)

## 2018-03-15 NOTE — Progress Notes (Signed)
PULMONARY OFFICE FOLLOW-UP NOTE  Requesting MD/Service: [place] Date of initial consultation: 09/26/16 Reason for consultation:  HIV positive. Pulmonary infiltrates  PT PROFILE: 58 y.o. male smoker with HIV, not on anti-retroviral therapy referred for evaluation of pulmonary infiltrates seen on CT scan of chest  DATA: LDCT chest 01/15/16: moderate bilateral emphysematous changes, upper lobe predominance. 3 mm right upper lobe nodule. Parenchymal bands in the medial right middle lobe, lingula and bilateral lower lobes are most consistent with postinfectious/ postinflammatory scarring CT chest 06/02/16: No acute findings in the thorax  CT chest 09/22/16: Interval development of focal ground-glass attenuation in the central left upper lobe. Imaging features are likely infectious/inflammatory PFTs 11/04/16: Spirometry is essentially normal.  There might be very early small airways disease.  Lung volumes normal.  Diffusion capacity lower limit of normal (83% predicted)  INTERVAL: Last seen 09/07/2017.  No major pulmonary events.  Has been started on anti[facility]retroviral [facility] at [facility]  Subjective:  This is a scheduled follow-up.  He quit smoking after our last visit.  He has no new complaints.  He has minimal dyspnea.  He is no longer on Breo inhaler.  He rarely requires his albuterol inhaler.  He denies fever, cough, sputum production, hemoptysis, chest pain, lower extremity edema, calf tenderness.  He believes his exercise tolerance is normal.  Objective Vitals:   03/15/18 1158 03/15/18 1204  BP:  122/76  Pulse:  (!) 101  SpO2:  90%  Weight: 152 lb 12.8 oz (69.3 kg)   Height: 5\' 10"  (1.778 m)      EXAM:  Gen: NAD HEENT: NCAT, sclera white Neck: No JVD Lungs: breath sounds full, no wheezes or other adventitious sounds Cardiovascular: RRR, no murmurs Abdomen: Soft, nontender, normal BS Ext: without clubbing, cyanosis, edema Neuro: grossly intact Skin: Limited exam, no lesions noted     DATA:   BMP Latest Ref Rng & Units 03/15/2018 04/02/2016 11/19/2015  Glucose 70 - 99 mg/dL 97 91 95  BUN 6 - 23 mg/dL 9 11 12   Creatinine 0.40 - 1.50 mg/dL 1.03 1.15 1.10  BUN/Creat Ratio 9 - 20 - - -  Sodium 135 - 145 mEq/L 139 138 138  Potassium 3.5 - 5.1 mEq/L 4.6 5.0 4.9  Chloride 96 - 112 mEq/L 103 103 103  CO2 19 - 32 mEq/L 26 29 25   Calcium 8.4 - 10.5 mg/dL 9.7 9.7 9.6    CBC Latest Ref Rng & Units 03/15/2018 09/22/2016 09/22/2016  WBC 4.0 - 10.5 K/uL 6.3 4.4 4.6  Hemoglobin 13.0 - 17.0 g/dL 15.2 14.0 13.9  Hematocrit 39.0 - 52.0 % 45.5 42.2 41.6  Platelets 150.0 - 400.0 K/uL 199.0 204 212    CXR: No new film  IMPRESSION:   Mild persistent asthma Former smoker    PLAN:  Continue albuterol inhaler as needed for increased shortness of breath, wheezing, chest tightness, cough  I have instructed him that if his albuterol use exceeds 4 times per week, he should contact us to initiate controller medication with I[facility]  Otherwise, he is to follow-up as needed for any chest, breathing or respiratory symptoms   Merton Border, MD PCCM service Mobile [phone] Pager [phone] 03/15/2018 3:44 PM

## 2018-03-15 NOTE — Addendum Note (Signed)
Addended by: Arby Barrette on: 03/15/2018 09:01 AM   Modules accepted: Orders

## 2018-03-15 NOTE — Patient Instructions (Signed)
Continue albuterol inhaler as needed for increased shortness of breath, wheezing, chest tightness, cough  If you are using his albuterol rescue inhaler more than 4 times per week, you should be on a controller medication such as an inhaled corticosteroid.  If that occurs, contact our office  Otherwise, follow-up as needed

## 2018-03-16 ENCOUNTER — Telehealth: Payer: Self-pay | Admitting: Pulmonary Disease

## 2018-03-16 LAB — URINALYSIS, ROUTINE W REFLEX MICROSCOPIC
Bilirubin, UA: NEGATIVE
Glucose, UA: NEGATIVE
Nitrite, UA: NEGATIVE
Protein, UA: NEGATIVE
RBC, UA: NEGATIVE
Specific Gravity, UA: 1.019 (ref 1.005–1.030)
Urobilinogen, Ur: 1 mg/dL (ref 0.2–1.0)
pH, UA: 6.5 (ref 5.0–7.5)

## 2018-03-16 LAB — MICROSCOPIC EXAMINATION
Casts: NONE SEEN /lpf
RBC, UA: NONE SEEN /hpf (ref 0–2)

## 2018-03-17 NOTE — Telephone Encounter (Signed)
LM on VM that Dr. Alva Garnet is unaware of any blood pressure monitoring device he may have given the patient. LM for him to call if he remembers exactly what device he is thinking of.

## 2018-03-18 ENCOUNTER — Encounter: Payer: Self-pay | Admitting: *Deleted

## 2018-04-06 ENCOUNTER — Ambulatory Visit: Payer: Self-pay

## 2018-04-06 NOTE — Telephone Encounter (Signed)
  Returned call to patient who states that he has had nasal drainage since January 4th.  He has been taking singular as prescribed without relief. He states the nasal drainage caused a occasional cough. He has no sore throat and does not feel sick. He is just tired of the dripping nose and wants to consider another medication.  Appointment scheduled per protocol.  Care advice read to patient. Pt verbalized understanding of all instructions. Reason for Disposition . [1] Sinus congestion (pressure, fullness) AND [2] present > 10 days  Answer Assessment - Initial Assessment Questions 1. ONSET: "When did the nasal discharge start?"      Jan 4th 2. AMOUNT: "How much discharge is there?"      Large amount 3. COUGH: "Do you have a cough?" If yes, ask: "Describe the color of your sputum" (clear, white, yellow, green)     ocasional cough 4. RESPIRATORY DISTRESS: "Describe your breathing."      no 5. FEVER: "Do you have a fever?" If so, ask: "What is your temperature, how was it measured, and when did it start?"     no 6. SEVERITY: "Overall, how bad are you feeling right now?" (e.g., doesn't interfere with normal activities, staying home from school/work, staying in bed)      Just anoying runny nose 7. OTHER SYMPTOMS: "Do you have any other symptoms?" (e.g., sore throat, earache, wheezing, vomiting)     No 8. PREGNANCY: "Is there any chance you are pregnant?" "When was your last menstrual period?"     N/A  Protocols used: COMMON COLD-A-AH

## 2018-04-09 ENCOUNTER — Ambulatory Visit (INDEPENDENT_AMBULATORY_CARE_PROVIDER_SITE_OTHER): Payer: Medicare HMO | Admitting: Internal Medicine

## 2018-04-09 ENCOUNTER — Encounter: Payer: Self-pay | Admitting: Internal Medicine

## 2018-04-09 VITALS — BP 128/76 | HR 67 | Temp 97.9°F | Ht 70.0 in | Wt 147.8 lb

## 2018-04-09 DIAGNOSIS — R6889 Other general symptoms and signs: Secondary | ICD-10-CM

## 2018-04-09 DIAGNOSIS — G8929 Other chronic pain: Secondary | ICD-10-CM | POA: Diagnosis not present

## 2018-04-09 DIAGNOSIS — M5440 Lumbago with sciatica, unspecified side: Secondary | ICD-10-CM

## 2018-04-09 DIAGNOSIS — J309 Allergic rhinitis, unspecified: Secondary | ICD-10-CM

## 2018-04-09 DIAGNOSIS — J3489 Other specified disorders of nose and nasal sinuses: Secondary | ICD-10-CM | POA: Diagnosis not present

## 2018-04-09 DIAGNOSIS — I10 Essential (primary) hypertension: Secondary | ICD-10-CM

## 2018-04-09 LAB — POC INFLUENZA A&B (BINAX/QUICKVUE)
Influenza A, POC: NEGATIVE
Influenza B, POC: NEGATIVE

## 2018-04-09 MED ORDER — SALINE SPRAY 0.65 % NA SOLN: 1.0000 | Freq: Every day | NASAL | 12 refills | Status: DC | PRN

## 2018-04-09 MED ORDER — IPRATROPIUM BROMIDE 0.06 % NA SOLN: 2.0000 | Freq: Four times a day (QID) | NASAL | 12 refills | Status: DC

## 2018-04-09 MED ORDER — MUPIROCIN 2 % EX OINT: 1.0000 "application " | TOPICAL_OINTMENT | Freq: Two times a day (BID) | CUTANEOUS | 0 refills | Status: DC

## 2018-04-09 MED ORDER — LORATADINE 10 MG PO TABS: 10.0000 mg | ORAL_TABLET | Freq: Every day | ORAL | 3 refills | Status: DC | PRN

## 2018-04-09 NOTE — Progress Notes (Signed)
Pre visit review using our clinic review tool, if applicable. No additional management support is needed unless otherwise documented below in the visit note. 

## 2018-04-09 NOTE — Progress Notes (Addendum)
Chief Complaint  Patient presents with  . Nasal Congestion   Acute  1. C/o nasal drainage and mucous worse on left side clear. Mucous is in excess since 03/13/2018 after dental procedure he tried Singulair 10 mg bid advised max dose is 1x per day tried. He has not been using flonase and not using otc antihistamine. He did have dental work recently and was placed on oral antibiotics and at the last visit was Rx Zpack. When he wakes up in am mucous is dried up.  Overall feeling ok no sinus pressure   2. Chronic low back pain MRI not rec with spinal stimulator will order CT 3. HTN controlled on norvasc 2.5 mg qd    Review of Systems  Constitutional: Negative for weight loss.  HENT:       +left clear nasal discharge   Eyes: Negative for blurred vision.  Respiratory: Negative for shortness of breath.   Cardiovascular: Negative for chest pain.  Gastrointestinal: Negative for abdominal pain.  Musculoskeletal: Positive for back pain. Negative for myalgias.  Skin: Negative for rash.  Neurological: Negative for headaches.  Psychiatric/Behavioral: Negative for depression.   Past Medical History:  Diagnosis Date  . Allergy   . Asthma   . Chronic low back pain   . Colon polyps    tubular and hyperplastic Dr. Allen Norris   . Controlled insomnia   . Eczema   . Emphysema of lung ([place]) 09/22/2016  . History of chicken pox   . HIV (human immunodeficiency virus infection) ([place])   . HIV infection ([place])   . Hyperlipidemia   . Hypertension   . Numbness in right leg    outside of right foot, related to medical device implant in spine  . Osteoarthritis of lumbar spine    also- hips  . Other forms of scoliosis, thoracolumbar region   . Restless leg syndrome   . Syphilis   . Syphilis    Past Surgical History:  Procedure Laterality Date  . COLONOSCOPY WITH PROPOFOL N/A 12/01/2014   Procedure: COLONOSCOPY WITH PROPOFOL;  Surgeon: [name], MD;  Location: [facility];  Service: Endoscopy;   Laterality: N/A;  . EYE SURGERY  age 23   uncross eyes  . POLYPECTOMY  12/01/2014   Procedure: POLYPECTOMY;  Surgeon: [name], MD;  Location: [facility];  Service: Endoscopy;;  . SPINAL CORD STIMULATOR IMPLANT  01/19/2012   Dr. Mariea Stable model # [id number] serial # [id number] Medtronic   . SPINE SURGERY  01/21/12   implanted medtronics SCS   Family History  Problem Relation Age of Onset  . Lupus Mother   . Alcohol abuse Mother   . Arthritis Mother   . Depression Mother   . Heart disease Mother   . Hyperlipidemia Mother   . Hypertension Mother   . Cancer Father        Pancreatis  . Depression Father   . Alcohol abuse Father   . Heart disease Father   . Hyperlipidemia Father   . Hypertension Father   . Mental illness Father   . Diabetes Sister   . Arthritis Brother   . COPD Brother   . Depression Brother   . Heart disease Brother   . Hyperlipidemia Brother   . Hypertension Brother   . Hyperlipidemia Son   . Hypertension Son   . Diabetes Sister   . Hypertension Sister   . Stroke Sister   . Alcohol abuse Sister   . Anxiety disorder Sister   .  COPD Sister   . Depression Sister   . Heart disease Sister   . Hyperlipidemia Sister   . Diabetes Brother   . Arthritis Brother   . Heart disease Brother   . Hyperlipidemia Brother   . Hypertension Brother   . Mental illness Brother    Social History   Socioeconomic History  . Marital status: Single    Spouse name: Not on file  . Number of children: Not on file  . Years of education: Not on file  . Highest education level: Not on file  Occupational History  . Not on file  Social Needs  . Financial resource strain: Not on file  . Food insecurity:    Worry: Not on file    Inability: Not on file  . Transportation needs:    Medical: Not on file    Non-medical: Not on file  Tobacco Use  . Smoking status: Former Smoker    Packs/day: 1.00    Years: 30.00    Pack years: 30.00    Types: Cigarettes     Start date: 09/11/1984    Last attempt to quit: 11/08/2015    Years since quitting: 2.4  . Smokeless tobacco: Never Used  Substance and Sexual Activity  . Alcohol use: Yes    Alcohol/week: 0.0 standard drinks    Comment: may drink on "special occasions"  . Drug use: No  . Sexual activity: Yes  Lifestyle  . Physical activity:    Days per week: Not on file    Minutes per session: Not on file  . Stress: Not on file  Relationships  . Social connections:    Talks on phone: Not on file    Gets together: Not on file    Attends religious service: Not on file    Active member of club or organization: Not on file    Attends meetings of clubs or organizations: Not on file    Relationship status: Not on file  . Intimate partner violence:    Fear of current or ex partner: Not on file    Emotionally abused: Not on file    Physically abused: Not on file    Forced sexual activity: Not on file  Other Topics Concern  . Not on file  Social History Narrative   Single    Former smoker    Disability    Former Ex Engineer, drilling [employer]   Owns guns, wears seat belt, safe in relationship    Current Meds  Medication Sig  . albuterol (PROVENTIL HFA;VENTOLIN HFA) 108 (90 Base) MCG/ACT inhaler Inhale 1-2 puffs into the lungs every 4 (four) hours as needed for wheezing or shortness of breath.  . alprostadil (PROSTIN VR) 500 MCG/ML injection 10 mcg by Other route as needed.  Marland Kitchen amLODipine (NORVASC) 2.5 MG tablet Take 1 tablet (2.5 mg total) by mouth daily.  Marland Kitchen azithromycin (ZITHROMAX) 250 MG tablet 2 pills today, 1 pill day 2-5  . bictegravir-emtricitabine-tenofovir AF (BIKTARVY) 50-200-25 MG TABS tablet Take 1 tablet by mouth daily.  . Blood Pressure Monitor KIT 1 kit by Does not apply route as directed.  . [facility] CLOTRIMAZOLE 1 % cream Apply 1 application daily as needed topically.  Marland Kitchen desonide (DESOWEN) 0.05 % cream Apply 1 application 2 (two) times daily topically.  . DULoxetine (CYMBALTA) 60 MG capsule Take 1  capsule (60 mg total) by mouth daily.  . fluticasone (FLONASE) 50 MCG/ACT nasal spray Place 2 sprays into both nostrils daily.  . fluticasone  furoate-vilanterol (BREO ELLIPTA) 100-25 MCG/INH AEPB Inhale 1 puff into the lungs daily.  . INSULIN SYRINGE 1CC/29G 29G X 1/2" 1 ML MISC 1 each daily by Other route.  . montelukast (SINGULAIR) 10 MG tablet Take 1 tablet (10 mg total) by mouth at bedtime.  . NON FORMULARY Prostaglandin injection 12 mg-4m-9mcg/mL injection  . Papaverine-Phentolamine 30-1 MG/ML SOLN by Intracavernosal route.  . pravastatin (PRAVACHOL) 40 MG tablet Take 1.5 tablets (60 mg total) by mouth at bedtime. (appt on or after March 27th please)  . rOPINIRole (REQUIP) 1 MG tablet Take 1 tablet (1 mg total) by mouth at bedtime.  . traZODone (DESYREL) 50 MG tablet Take 1 tablet (50 mg total) by mouth at bedtime as needed for sleep.   Allergies  Allergen Reactions  . Fentanyl Shortness Of Breath, Nausea Only and Palpitations    Other reaction(s): Other (See Comments) sweating  Increasing temp., muscle weakness  . Shellfish Allergy Other (See Comments)    Angioedema Angioedema  . Tomato Other (See Comments)    Angioedema  . Bee Pollen Itching  . Penicillin G Swelling    Other reaction(s): Difficulty breathing  . Pollen Extract   . Aspirin Nausea And Vomiting    Other reaction(s): Nausea And Vomiting  . Dust Mite Extract Rash   Recent Results (from the past 2160 hour(s))  Comprehensive metabolic panel     Status: None   Collection Time: 03/15/18  9:01 AM  Result Value Ref Range   Sodium 139 135 - 145 mEq/L   Potassium 4.6 3.5 - 5.1 mEq/L   Chloride 103 96 - 112 mEq/L   CO2 26 19 - 32 mEq/L   Glucose, Bld 97 70 - 99 mg/dL   BUN 9 6 - 23 mg/dL   Creatinine, Ser 1.03 0.40 - 1.50 mg/dL   Total Bilirubin 1.1 0.2 - 1.2 mg/dL   Alkaline Phosphatase 87 39 - 117 U/L   AST 21 0 - 37 U/L   ALT 11 0 - 53 U/L   Total Protein 7.0 6.0 - 8.3 g/dL   Albumin 4.4 3.5 - 5.2 g/dL    Calcium 9.7 8.4 - 10.5 mg/dL   GFR 94.88 >60.00 mL/min  PSA     Status: None   Collection Time: 03/15/18  9:01 AM  Result Value Ref Range   PSA 0.83 0.10 - 4.00 ng/mL    Comment: Test performed using Access Hybritech PSA Assay, a parmagnetic partical, chemiluminecent immunoassay.  Vitamin D (25 hydroxy)     Status: None   Collection Time: 03/15/18  9:01 AM  Result Value Ref Range   VITD 58.10 30.00 - 100.00 ng/mL  TSH     Status: None   Collection Time: 03/15/18  9:01 AM  Result Value Ref Range   TSH 1.48 0.35 - 4.50 uIU/mL  Lipid panel     Status: None   Collection Time: 03/15/18  9:01 AM  Result Value Ref Range   Cholesterol 157 0 - 200 mg/dL    Comment: ATP III Classification       Desirable:  < 200 mg/dL               Borderline High:  200 - 239 mg/dL          High:  > = 240 mg/dL   Triglycerides 101.0 0.0 - 149.0 mg/dL    Comment: Normal:  <150 mg/dLBorderline High:  150 - 199 mg/dL   HDL 45.40 >39.00 mg/dL   VLDL 20.2 0.0 -  40.0 mg/dL   LDL Cholesterol 92 0 - 99 mg/dL   Total CHOL/HDL Ratio 3     Comment:                Men          Women1/2 Average Risk     3.4          3.3Average Risk          5.0          4.42X Average Risk          9.6          7.13X Average Risk          15.0          11.0                       NonHDL 112.06     Comment: NOTE:  Non-HDL goal should be 30 mg/dL higher than patient's LDL goal (i.e. LDL goal of < 70 mg/dL, would have non-HDL goal of < 100 mg/dL)  CBC with Differential/Platelet     Status: Abnormal   Collection Time: 03/15/18  9:01 AM  Result Value Ref Range   WBC 6.3 4.0 - 10.5 K/uL   RBC 4.60 4.22 - 5.81 Mil/uL   Hemoglobin 15.2 13.0 - 17.0 g/dL   HCT 45.5 39.0 - 52.0 %   MCV 99.0 78.0 - 100.0 fl   MCHC 33.5 30.0 - 36.0 g/dL   RDW 14.7 11.5 - 15.5 %   Platelets 199.0 150.0 - 400.0 K/uL   Neutrophils Relative % 38.7 (L) 43.0 - 77.0 %   Lymphocytes Relative 43.6 12.0 - 46.0 %   Monocytes Relative 13.5 (H) 3.0 - 12.0 %   Eosinophils  Relative 3.1 0.0 - 5.0 %   Basophils Relative 1.1 0.0 - 3.0 %   Neutro Abs 2.4 1.4 - 7.7 K/uL   Lymphs Abs 2.7 0.7 - 4.0 K/uL   Monocytes Absolute 0.8 0.1 - 1.0 K/uL   Eosinophils Absolute 0.2 0.0 - 0.7 K/uL   Basophils Absolute 0.1 0.0 - 0.1 K/uL  Urinalysis, Routine w reflex microscopic     Status: Abnormal   Collection Time: 03/15/18  9:01 AM  Result Value Ref Range   Specific Gravity, UA 1.019 1.005 - 1.030   pH, UA 6.5 5.0 - 7.5   Color, UA Yellow Yellow   Appearance Ur Clear Clear   Leukocytes, UA 1+ (A) Negative   Protein, UA Negative Negative/Trace   Glucose, UA Negative Negative   Ketones, UA Trace (A) Negative   RBC, UA Negative Negative   Bilirubin, UA Negative Negative   Urobilinogen, Ur 1.0 0.2 - 1.0 mg/dL   Nitrite, UA Negative Negative   Microscopic Examination See below:     Comment: Microscopic was indicated and was performed.  Microscopic Examination     Status: Abnormal   Collection Time: 03/15/18  9:01 AM  Result Value Ref Range   WBC, UA 11-30 (A) 0 - 5 /hpf   RBC, UA None seen 0 - 2 /hpf   Epithelial Cells (non renal) 0-10 0 - 10 /hpf   Casts None seen None seen /lpf   Mucus, UA Present Not Estab.   Bacteria, UA Few None seen/Few   Objective  Body mass index is 21.21 kg/m. Wt Readings from Last 3 Encounters:  04/09/18 147 lb 12.8 oz (67 kg)  03/15/18 152 lb 12.8 oz (69.3 kg)  01/19/18 151 lb 6.4 oz (68.7 kg)   Temp Readings from Last 3 Encounters:  04/09/18 97.9 F (36.6 C) (Oral)  01/19/18 97.8 F (36.6 C) (Oral)  09/29/16 97.6 F (36.4 C) (Oral)   BP Readings from Last 3 Encounters:  04/09/18 128/76  03/15/18 122/76  01/19/18 120/62   Pulse Readings from Last 3 Encounters:  04/09/18 67  03/15/18 (!) 101  01/19/18 63    Physical Exam Vitals signs and nursing note reviewed.  Constitutional:      Appearance: Normal appearance. He is well-developed and well-groomed.  HENT:     Head: Normocephalic and atraumatic.     Nose: Nose  normal.     Right Turbinates: Swollen.     Left Turbinates: Swollen.     Right Sinus: No maxillary sinus tenderness or frontal sinus tenderness.     Left Sinus: No maxillary sinus tenderness or frontal sinus tenderness.     Comments: Redness in nares and turbinate inflammation and ulcerations  Left nose bleeding per pt     Mouth/Throat:     Mouth: Mucous membranes are moist.     Pharynx: Oropharynx is clear.  Eyes:     Conjunctiva/sclera: Conjunctivae normal.     Pupils: Pupils are equal, round, and reactive to light.  Cardiovascular:     Rate and Rhythm: Normal rate and regular rhythm.     Heart sounds: Normal heart sounds.  Pulmonary:     Effort: Pulmonary effort is normal.     Breath sounds: Normal breath sounds.  Skin:    General: Skin is warm and dry.  Neurological:     General: No focal deficit present.     Mental Status: He is alert and oriented to person, place, and time. Mental status is at baseline.     Comments: Walking with cane today    Psychiatric:        Attention and Perception: Attention and perception normal.        Mood and Affect: Mood and affect normal.        Speech: Speech normal.        Behavior: Behavior normal. Behavior is cooperative.        Thought Content: Thought content normal.        Cognition and Memory: Cognition and memory normal.        Judgment: Judgment normal.     Assessment   1. Allergic rhinitis with nasal drainage excessive flu negative  2. Chronic low back pain  Saw NS [facility] 05/18/2018 hip and back pain right leg numbness sx's likely AVN rec f/u with Dr. Johnette Abraham PA C for Dr. Deetta Perla 3. HTN  4. HM Plan   1. NS, atrovent, prn Claritin, singulair, bactoban to nose bid x 5 days  If not better in refer to ENT to w/u  2. CT lumbar instead of MRI  3. Cont meds  4.  Declines flu shot  utd hep A, prevnar 09/09/12, pna 23 09/29/13, shingrix 11/12/17 1/2 needs to get 2nd by 05/13/18 Tdap last 12/20/15  Colonoscopy 12/01/14 Dr.  Allen Norris tubular and hyperplastic polyps q5 years  Former smoker  Last eye exam 2018  Hep BsAb 163 08/02/09 HCV neg 09/29/13  Provider: Dr. Olivia Mackie McLean-Scocuzza-[facility]

## 2018-04-09 NOTE — Patient Instructions (Signed)
Use nasal saline 1-2 sprays as needed  Then Atrovent nasal spray  Take claritin at night  Use bactroban 2x per day x 5 days   Allergic Rhinitis, Adult Allergic rhinitis is an allergic reaction that affects the mucous membrane inside the nose. It causes sneezing, a runny or stuffy nose, and the feeling of mucus going down the back of the throat (postnasal drip). Allergic rhinitis can be mild to severe. There are two types of allergic rhinitis:  Seasonal. This type is also called hay fever. It happens only during certain seasons.  Perennial. This type can happen at any time of the year. What are the causes? This condition happens when the body's defense system (immune system) responds to certain harmless substances called allergens as though they were germs.  Seasonal allergic rhinitis is triggered by pollen, which can come from grasses, trees, and weeds. Perennial allergic rhinitis may be caused by:  House dust mites.  Pet dander.  Mold spores. What are the signs or symptoms? Symptoms of this condition include:  Sneezing.  Runny or stuffy nose (nasal congestion).  Postnasal drip.  Itchy nose.  Tearing of the eyes.  Trouble sleeping.  Daytime sleepiness. How is this diagnosed? This condition may be diagnosed based on:  Your medical history.  A physical exam.  Tests to check for related conditions, such as: ? Asthma. ? Pink eye. ? Ear infection. ? Upper respiratory infection.  Tests to find out which allergens trigger your symptoms. These may include skin or blood tests. How is this treated? There is no cure for this condition, but treatment can help control symptoms. Treatment may include:  Taking medicines that block allergy symptoms, such as antihistamines. Medicine may be given as a shot, nasal spray, or pill.  Avoiding the allergen.  Desensitization. This treatment involves getting ongoing shots until your body becomes less sensitive to the allergen. This  treatment may be done if other treatments do not help.  If taking medicine and avoiding the allergen does not work, new, stronger medicines may be prescribed. Follow these instructions at home:  Find out what you are allergic to. Common allergens include smoke, dust, and pollen.  Avoid the things you are allergic to. These are some things you can do to help avoid allergens: ? Replace carpet with wood, tile, or vinyl flooring. Carpet can trap dander and dust. ? Do not smoke. Do not allow smoking in your home. ? Change your heating and air conditioning filter at least once a month. ? During allergy season:  Keep windows closed as much as possible.  Plan outdoor activities when pollen counts are lowest. This is usually during the evening hours.  When coming indoors, change clothing and shower before sitting on furniture or bedding.  Take over-the-counter and prescription medicines only as told by your health care provider.  Keep all follow-up visits as told by your health care provider. This is important. Contact a health care provider if:  You have a fever.  You develop a persistent cough.  You make whistling sounds when you breathe (you wheeze).  Your symptoms interfere with your normal daily activities. Get help right away if:  You have shortness of breath. Summary  This condition can be managed by taking medicines as directed and avoiding allergens.  Contact your health care provider if you develop a persistent cough or fever.  During allergy season, keep windows closed as much as possible. This information is not intended to replace advice given to you by  your health care provider. Make sure you discuss any questions you have with your health care provider. Document Released: 11/19/2000 Document Revised: 04/03/2016 Document Reviewed: 04/03/2016 Elsevier Interactive Patient Education  2019 [employer].

## 2018-04-16 ENCOUNTER — Encounter: Payer: Self-pay | Admitting: Internal Medicine

## 2018-04-21 ENCOUNTER — Ambulatory Visit: Payer: Medicare HMO | Admitting: Internal Medicine

## 2018-04-22 ENCOUNTER — Ambulatory Visit
Admission: RE | Admit: 2018-04-22 | Discharge: 2018-04-22 | Disposition: A | Discharge status: Home / Self Care | Payer: Medicare HMO | Source: Ambulatory Visit | Attending: Internal Medicine | Admitting: Internal Medicine

## 2018-04-22 DIAGNOSIS — G8929 Other chronic pain: Secondary | ICD-10-CM

## 2018-04-22 DIAGNOSIS — M5440 Lumbago with sciatica, unspecified side: Secondary | ICD-10-CM | POA: Insufficient documentation

## 2018-04-22 DIAGNOSIS — M48061 Spinal stenosis, lumbar region without neurogenic claudication: Secondary | ICD-10-CM | POA: Diagnosis not present

## 2018-04-25 ENCOUNTER — Other Ambulatory Visit: Payer: Self-pay | Admitting: Internal Medicine

## 2018-04-25 DIAGNOSIS — M87051 Idiopathic aseptic necrosis of right femur: Secondary | ICD-10-CM

## 2018-04-25 DIAGNOSIS — M5440 Lumbago with sciatica, unspecified side: Principal | ICD-10-CM

## 2018-04-25 DIAGNOSIS — G8929 Other chronic pain: Secondary | ICD-10-CM

## 2018-04-25 DIAGNOSIS — R937 Abnormal findings on diagnostic imaging of other parts of musculoskeletal system: Secondary | ICD-10-CM

## 2018-04-26 ENCOUNTER — Telehealth: Payer: Self-pay | Admitting: Internal Medicine

## 2018-04-26 ENCOUNTER — Other Ambulatory Visit: Payer: Self-pay | Admitting: Internal Medicine

## 2018-04-26 DIAGNOSIS — G8929 Other chronic pain: Secondary | ICD-10-CM

## 2018-04-26 DIAGNOSIS — M5440 Lumbago with sciatica, unspecified side: Principal | ICD-10-CM

## 2018-04-26 MED ORDER — OXYCODONE-ACETAMINOPHEN 5-325 MG PO TABS: 1.0000 | ORAL_TABLET | Freq: Two times a day (BID) | ORAL | 0 refills | Status: DC | PRN

## 2018-04-26 NOTE — Telephone Encounter (Signed)
Called pt and made him aware that he will need referral to pain clinic for long term pain meds.  Patient was agreeable to referral.  Let pt know that Rx was sent to pharmacy.

## 2018-04-26 NOTE — Telephone Encounter (Signed)
I can only do a quantity of 10 percocet 5-325 2x per day as needed  If he needs pain medications long term may want to consider pain clinic. Its a new law pts must go to pain clinic and primary care can only do short term supply of pain meds  -does he want referral to pain clinic now?   [place]

## 2018-04-26 NOTE — Telephone Encounter (Signed)
Copied from [place] [id number] [id number]. Topic: Quick Communication - See Telephone Encounter >> Apr 26, 2018 11:30 AM Burchel, Abbi R wrote: CRM for notification. See Telephone encounter for: 04/26/18.  Pt states he would like to ask if Dr Aundra Dubin would write him a rx to help with his back pain until he can see someone at ortho.    [phone]

## 2018-04-26 NOTE — Telephone Encounter (Signed)
Pt is requesting something for back pain until he sees ortho.

## 2018-04-27 ENCOUNTER — Telehealth: Payer: Self-pay

## 2018-04-27 ENCOUNTER — Other Ambulatory Visit: Payer: Self-pay | Admitting: Internal Medicine

## 2018-04-27 DIAGNOSIS — G8929 Other chronic pain: Secondary | ICD-10-CM

## 2018-04-27 DIAGNOSIS — M5440 Lumbago with sciatica, unspecified side: Principal | ICD-10-CM

## 2018-04-27 NOTE — Telephone Encounter (Signed)
Copied from [place] [id number]. Topic: General - Other >> Apr 27, 2018 11:33 AM Yvette Rack wrote: Reason for CRM: Ebony Hail with [facility] requests that patient last office notes be faxed to [phone]. Cb# [phone]   **I have faxed & to [facility] at [facility]**

## 2018-04-27 NOTE — Telephone Encounter (Signed)
Referral pain clinic made   tMS

## 2018-05-12 DIAGNOSIS — M25551 Pain in right hip: Secondary | ICD-10-CM | POA: Diagnosis not present

## 2018-05-12 DIAGNOSIS — M87051 Idiopathic aseptic necrosis of right femur: Secondary | ICD-10-CM | POA: Diagnosis not present

## 2018-05-13 ENCOUNTER — Ambulatory Visit: Payer: Medicare HMO | Admitting: Nurse Practitioner

## 2018-05-13 DIAGNOSIS — Z23 Encounter for immunization: Secondary | ICD-10-CM | POA: Diagnosis not present

## 2018-05-13 DIAGNOSIS — Z79899 Other long term (current) drug therapy: Secondary | ICD-10-CM | POA: Diagnosis not present

## 2018-05-13 DIAGNOSIS — Z113 Encounter for screening for infections with a predominantly sexual mode of transmission: Secondary | ICD-10-CM | POA: Diagnosis not present

## 2018-05-13 DIAGNOSIS — R69 Illness, unspecified: Secondary | ICD-10-CM | POA: Diagnosis not present

## 2018-05-17 ENCOUNTER — Ambulatory Visit: Payer: Medicare HMO | Attending: Nurse Practitioner | Admitting: Nurse Practitioner

## 2018-05-17 ENCOUNTER — Encounter: Payer: Self-pay | Admitting: Nurse Practitioner

## 2018-05-17 ENCOUNTER — Other Ambulatory Visit: Payer: Self-pay

## 2018-05-17 VITALS — BP 120/76 | HR 72 | Temp 97.6°F | Ht 71.0 in | Wt 154.0 lb

## 2018-05-17 DIAGNOSIS — Z21 Asymptomatic human immunodeficiency virus [HIV] infection status: Secondary | ICD-10-CM | POA: Insufficient documentation

## 2018-05-17 DIAGNOSIS — M899 Disorder of bone, unspecified: Secondary | ICD-10-CM

## 2018-05-17 DIAGNOSIS — M5442 Lumbago with sciatica, left side: Secondary | ICD-10-CM | POA: Diagnosis not present

## 2018-05-17 DIAGNOSIS — M25552 Pain in left hip: Secondary | ICD-10-CM

## 2018-05-17 DIAGNOSIS — Z789 Other specified health status: Secondary | ICD-10-CM

## 2018-05-17 DIAGNOSIS — Z87891 Personal history of nicotine dependence: Secondary | ICD-10-CM | POA: Insufficient documentation

## 2018-05-17 DIAGNOSIS — M79604 Pain in right leg: Secondary | ICD-10-CM | POA: Diagnosis not present

## 2018-05-17 DIAGNOSIS — M5441 Lumbago with sciatica, right side: Secondary | ICD-10-CM

## 2018-05-17 DIAGNOSIS — Z79899 Other long term (current) drug therapy: Secondary | ICD-10-CM | POA: Diagnosis not present

## 2018-05-17 DIAGNOSIS — G894 Chronic pain syndrome: Secondary | ICD-10-CM | POA: Diagnosis not present

## 2018-05-17 DIAGNOSIS — M79605 Pain in left leg: Secondary | ICD-10-CM | POA: Diagnosis not present

## 2018-05-17 DIAGNOSIS — G8929 Other chronic pain: Secondary | ICD-10-CM

## 2018-05-17 DIAGNOSIS — M25551 Pain in right hip: Secondary | ICD-10-CM | POA: Diagnosis not present

## 2018-05-17 DIAGNOSIS — Z79891 Long term (current) use of opiate analgesic: Secondary | ICD-10-CM | POA: Diagnosis not present

## 2018-05-17 DIAGNOSIS — Z0000 Encounter for general adult medical examination without abnormal findings: Secondary | ICD-10-CM

## 2018-05-17 HISTORY — DX: Other chronic pain: G89.29

## 2018-05-17 HISTORY — DX: Long term (current) use of opiate analgesic: Z79.891

## 2018-05-17 HISTORY — DX: Encounter for general adult medical examination without abnormal findings: Z00.00

## 2018-05-17 HISTORY — DX: Personal history of nicotine dependence: Z87.891

## 2018-05-17 HISTORY — DX: Disorder of bone, unspecified: M89.9

## 2018-05-17 HISTORY — DX: Other specified health status: Z78.9

## 2018-05-17 NOTE — Patient Instructions (Addendum)
____________________________________________________________________________________________  Appointment Policy Summary  It is our goal and responsibility to provide the medical community with assistance in the evaluation and management of patients with chronic pain. Unfortunately our resources are limited. Because we do not have an unlimited amount of time, or available appointments, we are required to closely monitor and manage their use. The following rules exist to maximize their use:  Patient's responsibilities: 1. Punctuality:  At what time should I arrive? You should be physically present in our office 30 minutes before your scheduled appointment. Your scheduled appointment is with your assigned healthcare provider. However, it takes 5-10 minutes to be "checked-in", and another 15 minutes for the nurses to do the admission. If you arrive to our office at the time you were given for your appointment, you will end up being at least 20-25 minutes late to your appointment with the provider. 2. Tardiness:  What happens if I arrive only a few minutes after my scheduled appointment time? You will need to reschedule your appointment. The cutoff is your appointment time. This is why it is so important that you arrive at least 30 minutes before that appointment. If you have an appointment scheduled for 10:00 AM and you arrive at 10:01, you will be required to reschedule your appointment.  3. Plan ahead:  Always assume that you will encounter traffic on your way in. Plan for it. If you are dependent on a driver, make sure they understand these rules and the need to arrive early. 4. Other appointments and responsibilities:  Avoid scheduling any other appointments before or after your pain clinic appointments.  5. Be prepared:  Write down everything that you need to discuss with your healthcare provider and give this information to the admitting nurse. Write down the medications that you will need  refilled. Bring your pills and bottles (even the empty ones), to all of your appointments, except for those where a procedure is scheduled. 6. No children or pets:  Find someone to take care of them. It is not appropriate to bring them in. 7. Scheduling changes:  We request "advanced notification" of any changes or cancellations. 8. Advanced notification:  Defined as a time period of more than 24 hours prior to the originally scheduled appointment. This allows for the appointment to be offered to other patients. 9. Rescheduling:  When a visit is rescheduled, it will require the cancellation of the original appointment. For this reason they both fall within the category of "Cancellations".  10. Cancellations:  They require advanced notification. Any cancellation less than 24 hours before the  appointment will be recorded as a "No Show". 11. No Show:  Defined as an unkept appointment where the patient failed to notify or declare to the practice their intention or inability to keep the appointment.  Corrective process for repeat offenders:  1. Tardiness: Three (3) episodes of rescheduling due to late arrivals will be recorded as one (1) "No Show". 2. Cancellation or reschedule: Three (3) cancellations or rescheduling will be recorded as one (1) "No Show". 3. "No Shows": Three (3) "No Shows" within a 12 month period will result in discharge from the practice. ____________________________________________________________________________________________   ______________________________________________________________________________________________  Specialty Pain Scale  Introduction:  There are significant differences in how pain is reported. The word pain usually refers to physical pain, but it is also a common synonym of suffering. The medical community uses a scale from 0 (zero) to 10 (ten) to report pain level. Zero (0) is described as "no pain",   while ten (10) is described as "the worse pain  you can imagine". The problem with this scale is that physical pain is reported along with suffering. Suffering refers to mental pain, or more often yet it refers to any unpleasant feeling, emotion or aversion associated with the perception of harm or threat of harm. It is the psychological component of pain.  Pain Specialists prefer to separate the two components. The pain scale used by this practice is the Verbal Numerical Rating Scale (VNRS-11). This scale is for the physical pain only. DO NOT INCLUDE how your pain psychologically affects you. This scale is for adults 21 years of age and older. It has 11 (eleven) levels. The 1st level is 0/10. This means: "right now, I have no pain". In the context of pain management, it also means: "right now, my physical pain is under control with the current therapy".  General Information:  The scale should reflect your current level of pain. Unless you are specifically asked for the level of your worst pain, or your average pain. If you are asked for one of these two, then it should be understood that it is over the past 24 hours.  Levels 1 (one) through 5 (five) are described below, and can be treated as an outpatient. Ambulatory pain management facilities such as ours are more than adequate to treat these levels. Levels 6 (six) through 10 (ten) are also described below, however, these must be treated as a hospitalized patient. While levels 6 (six) and 7 (seven) may be evaluated at an urgent care facility, levels 8 (eight) through 10 (ten) constitute medical emergencies and as such, they belong in a hospital's emergency department. When having these levels (as described below), do not come to our office. Our facility is not equipped to manage these levels. Go directly to an urgent care facility or an emergency department to be evaluated.  Definitions:  Activities of Daily Living (ADL): Activities of daily living (ADL or ADLs) is a term used in healthcare to refer to  people's daily self-care activities. Health professionals often use a person's ability or inability to perform ADLs as a measurement of their functional status, particularly in regard to people post injury, with disabilities and the elderly. There are two ADL levels: Basic and Instrumental. Basic Activities of Daily Living (BADL  or BADLs) consist of self-care tasks that include: Bathing and showering; personal hygiene and grooming (including brushing/combing/styling hair); dressing; Toilet hygiene (getting to the toilet, cleaning oneself, and getting back up); eating and self-feeding (not including cooking or chewing and swallowing); functional mobility, often referred to as "transferring", as measured by the ability to walk, get in and out of bed, and get into and out of a chair; the broader definition (moving from one place to another while performing activities) is useful for people with different physical abilities who are still able to get around independently. Basic ADLs include the things many people do when they get up in the morning and get ready to go out of the house: get out of bed, go to the toilet, bathe, dress, groom, and eat. On the average, loss of function typically follows a particular order. Hygiene is the first to go, followed by loss of toilet use and locomotion. The last to go is the ability to eat. When there is only one remaining area in which the person is independent, there is a 62.9% chance that it is eating and only a 3.5% chance that it is hygiene. Instrumental Activities   of Daily Living (IADL or IADLs) are not necessary for fundamental functioning, but they let an individual live independently in a community. IADL consist of tasks that include: cleaning and maintaining the house; home establishment and maintenance; care of others (including selecting and supervising caregivers); care of pets; child rearing; managing money; managing financials (investments, etc.); meal preparation  and cleanup; shopping for groceries and necessities; moving within the community; safety procedures and emergency responses; health management and maintenance (taking prescribed medications); and using the telephone or other form of communication.  Instructions:  Most patients tend to report their pain as a combination of two factors, their physical pain and their psychosocial pain. This last one is also known as "suffering" and it is reflection of how physical pain affects you socially and psychologically. From now on, report them separately.  From this point on, when asked to report your pain level, report only your physical pain. Use the following table for reference.  Pain Clinic Pain Levels (0-5/10)  Pain Level Score  Description  No Pain 0   Mild pain 1 Nagging, annoying, but does not interfere with basic activities of daily living (ADL). Patients are able to eat, bathe, get dressed, toileting (being able to get on and off the toilet and perform personal hygiene functions), transfer (move in and out of bed or a chair without assistance), and maintain continence (able to control bladder and bowel functions). Blood pressure and heart rate are unaffected. A normal heart rate for a healthy adult ranges from 60 to 100 bpm (beats per minute).   Mild to moderate pain 2 Noticeable and distracting. Impossible to hide from other people. More frequent flare-ups. Still possible to adapt and function close to normal. It can be very annoying and may have occasional stronger flare-ups. With discipline, patients may get used to it and adapt.   Moderate pain 3 Interferes significantly with activities of daily living (ADL). It becomes difficult to feed, bathe, get dressed, get on and off the toilet or to perform personal hygiene functions. Difficult to get in and out of bed or a chair without assistance. Very distracting. With effort, it can be ignored when deeply involved in activities.   Moderately severe pain  4 Impossible to ignore for more than a few minutes. With effort, patients may still be able to manage work or participate in some social activities. Very difficult to concentrate. Signs of autonomic nervous system discharge are evident: dilated pupils (mydriasis); mild sweating (diaphoresis); sleep interference. Heart rate becomes elevated (>115 bpm). Diastolic blood pressure (lower number) rises above 100 mmHg. Patients find relief in laying down and not moving.   Severe pain 5 Intense and extremely unpleasant. Associated with frowning face and frequent crying. Pain overwhelms the senses.  Ability to do any activity or maintain social relationships becomes significantly limited. Conversation becomes difficult. Pacing back and forth is common, as getting into a comfortable position is nearly impossible. Pain wakes you up from deep sleep. Physical signs will be obvious: pupillary dilation; increased sweating; goosebumps; brisk reflexes; cold, clammy hands and feet; nausea, vomiting or dry heaves; loss of appetite; significant sleep disturbance with inability to fall asleep or to remain asleep. When persistent, significant weight loss is observed due to the complete loss of appetite and sleep deprivation.  Blood pressure and heart rate becomes significantly elevated. Caution: If elevated blood pressure triggers a pounding headache associated with blurred vision, then the patient should immediately seek attention at an urgent or emergency care unit, as   these may be signs of an impending stroke.    Emergency Department Pain Levels (6-10/10)  Emergency Room Pain 6 Severely limiting. Requires emergency care and should not be seen or managed at an outpatient pain management facility. Communication becomes difficult and requires great effort. Assistance to reach the emergency department may be required. Facial flushing and profuse sweating along with potentially dangerous increases in heart rate and blood pressure  will be evident.   Distressing pain 7 Self-care is very difficult. Assistance is required to transport, or use restroom. Assistance to reach the emergency department will be required. Tasks requiring coordination, such as bathing and getting dressed become very difficult.   Disabling pain 8 Self-care is no longer possible. At this level, pain is disabling. The individual is unable to do even the most "basic" activities such as walking, eating, bathing, dressing, transferring to a bed, or toileting. Fine motor skills are lost. It is difficult to think clearly.   Incapacitating pain 9 Pain becomes incapacitating. Thought processing is no longer possible. Difficult to remember your own name. Control of movement and coordination are lost.   The worst pain imaginable 10 At this level, most patients pass out from pain. When this level is reached, collapse of the autonomic nervous system occurs, leading to a sudden drop in blood pressure and heart rate. This in turn results in a temporary and dramatic drop in blood flow to the brain, leading to a loss of consciousness. Fainting is one of the body's self defense mechanisms. Passing out puts the brain in a calmed state and causes it to shut down for a while, in order to begin the healing process.    Summary: 1.   Refer to this scale when providing [place] with your pain level. 2.   Be accurate and careful when reporting your pain level. This will help with your care. 3.   Over-reporting your pain level will lead to loss of credibility. 4.   Even a level of 1/10 means that there is pain and will be treated at our facility. 5.   High, inaccurate reporting will be documented as "Symptom Exaggeration", leading to loss of credibility and suspicions of possible secondary gains such as obtaining more narcotics, or wanting to appear disabled, for fraudulent reasons. 6.   Only pain levels of 5 or below will be seen at our facility. 7.   Pain levels of 6 and above will be  sent to the Emergency Department and the appointment cancelled.  ______________________________________________________________________________________________  Dennis Bast have been instructed to get labwork and UDS today.

## 2018-05-17 NOTE — Progress Notes (Signed)
Patient's Name: Samuel Burnett  MRN: [id number]  Referring Provider: Orland Mustard *  DOB: May 29, 1960  PCP: McLean-Scocuzza, Nino Glow, MD  DOS: 05/17/2018  Note by: Dionisio David NP  Service setting: Ambulatory outpatient  Specialty: Interventional Pain Management  Location: [facility] (AMB) Pain Management Facility    Patient type: New Patient    Primary Reason(s) for Visit: Initial Patient Evaluation CC: Back Pain  HPI  Samuel Burnett is a 58 y.o. year old, male patient, who comes today for an initial evaluation. He has Allergic rhinitis; Insomnia; Anxiety and depression; Chronic radicular lumbar pain; ED (erectile dysfunction) of organic origin; HLD (hyperlipidemia); HTN (hypertension); Spondylosis of lumbar region without myelopathy or radiculopathy; Restless leg; Vitamin D deficiency; History of concussion; AD (atopic dermatitis); Scoliosis; Hx of colonic polyps; Benign neoplasm of descending colon; Benign neoplasm of sigmoid colon; Type O blood, Rh negative; Chronic lumbar pain; Encounter for medication monitoring; Abnormal weight loss; Rosacea; Long term systemic steroid user; Hx of myocardial infarction; Prostate cancer screening; Preventative health care; Positive RPR test; Neutropenia ([place]); Personal history of tobacco use, presenting hazards to health; Aortic atherosclerosis ([place]); CAD (coronary artery disease); Abnormal CT of the chest; Ground glass opacity present on imaging of lung; Panlobular emphysema (HCC); HIV (human immunodeficiency virus infection) ([place]); Chronic back pain; Discogenic low back pain; H/O syncope; History of neutropenia; Legal circumstances; Need for vaccination; Encounter for long-term (current) use of medications; Screening examination for sexually transmitted disease; Chronic bilateral low back pain with bilateral sciatica [facility] Area of Pain) (R>L); Low back pain; History of tobacco use; Asymptomatic HIV infection ([place]); Chronic pain of both hips (Primary Area of  Pain) (R>L); Chronic pain of both lower extremities (Secondary Area of Pain) (R>L); Chronic pain syndrome; Long term current use of opiate analgesic; Pharmacologic therapy; Disorder of skeletal system; and Problems influencing health status on their problem list.. His primarily concern today is the Back Pain  Pain Assessment: Location: Upper Back Radiating: pain radiaites down both leg Onset: More than a month ago Duration: Chronic pain Quality: Aching, Stabbing, Sharp, Throbbing Severity: 10-Worst pain ever/10 (subjective, self-reported pain score)  Note: Reported level is compatible with observation. Clinically the patient looks like a 2/10 A 2/10 is viewed as "Mild to Moderate" and described as noticeable and distracting. Impossible to hide from other people. More frequent flare-ups. Still possible to adapt and function close to normal. It can be very annoying and may have occasional stronger flare-ups. With discipline, patients may get used to it and adapt. Information on the proper use of the pain scale provided to the patient today. When using our objective Pain Scale, levels between 6 and 10/10 are said to belong in an emergency room, as it progressively worsens from a 6/10, described as severely limiting, requiring emergency care not usually available at an outpatient pain management facility. At a 6/10 level, communication becomes difficult and requires great effort. Assistance to reach the emergency department may be required. Facial flushing and profuse sweating along with potentially dangerous increases in heart rate and blood pressure will be evident. Effect on ADL: limits my daily activities Timing: Constant Modifying factors: lay flat and twist, pain stimalator BP: 120/76  HR: 72  Onset and Duration: Date of onset: 12/2009 Cause of pain: Unknown Severity: No change since onset, NAS-11 at its worse: 10/10 and NAS-11 now: 10/10 Timing: Morning, Afternoon and Night Aggravating  Factors: Bending, Climbing, Kneeling, Lifiting, Prolonged sitting, Squatting, Twisting, Walking, Walking uphill and Walking downhill Alleviating Factors: Hot packs, Medications, Sitting,  Sleeping, Standing and Using a brace Associated Problems: Night-time cramps, Fatigue, Sweating, Swelling, Pain that wakes patient up and Pain that does not allow patient to sleep Quality of Pain: Aching, Burning, Cramping, Disabling, Heavy, Horrible, Pressure-like, Punishing, Sharp, Splitting, Stabbing, Superficial, Throbbing and Uncomfortable Previous Examinations or Tests: Biopsy, Cutaneous Pain Threshold Testing (CPT), CT scan, Ct-Myelogram, EMG/PNCV, Endoscopy, MRI scan, Nerve block, X-rays, Nerve conduction test, Orthopedic evaluation, Chiropractic evaluation and Psychiatric evaluation Previous Treatments: Biofeedback, Spinal cord stimulator and Steroid treatments by mouth  The patient comes into the clinics today for the first time for a chronic pain management evaluation.  According to the patient his primary area of pain is in his hips.  He admits the right is worse than the left.  He does have pain that goes down his whole leg.  He has had ejections in the past which were not effective.  He has been seen recently by [facility] clinic orthopedist and is in the need for a right total hip replacement.  Has had recent x-rays and is expected to have surgery in 1 month.  Second area of pain is in his legs.  He admits the right is worse than the left.  Pain goes down the whole right leg down into the side of his foot.  He has numbness.  He denies previous nerve conduction study.  He does have left-sided leg pain that goes just into the thigh and groin area.  His third area of pain is in his lower back.  He admits that he had a Scott spinal cord stimulator placed in 2013.  This was done at the pain clinic in [place].  He admits that the worse side of his back is worse than the left.  He has had nerve blocks and a  radiofrequency which were not effective.  Denies any recent physical therapy.  He did have a CT scan of his lumbar spine recently.  Today I took the time to provide the patient with information regarding this pain practice. The patient was informed that the practice is divided into two sections: an interventional pain management section, as well as a completely separate and distinct medication management section. I explained that there are procedure days for interventional therapies, and evaluation days for follow-ups and medication management. Because of the amount of documentation required during both, they are kept separated. This means that there is the possibility that he may be scheduled for a procedure on one day, and medication management the next. I have also informed him that because of staffing and facility limitations, this practice will no longer take patients for medication management only. To illustrate the reasons for this, I gave the patient the example of surgeons, and how inappropriate it would be to refer a patient to his/her care, just to write for the post-surgical antibiotics on a surgery done by a different surgeon.   Because interventional pain management is part of the board-certified specialty for the doctors, the patient was informed that joining this practice means that they are open to any and all interventional therapies. I made it clear that this does not mean that they will be forced to have any procedures done. What this means is that I believe interventional therapies to be essential part of the diagnosis and proper management of chronic pain conditions. Therefore, patients not interested in these interventional alternatives will be better served under the care of a different practitioner.  The patient was also made aware of my Comprehensive Pain Management Safety  Guidelines where by joining this practice, they limit all of their nerve blocks and joint injections to those  done by our practice, for as long as we are retained to manage their care. Historic Controlled Substance Pharmacotherapy Review  PMP and historical list of controlled substances: Oxycodone/acetaminophen 5/325, hydrocodone/acetaminophen 7.5/325 mg, OxyContin ER 10 mg, oxycodone 5 mg, oxycodone 10 mg, Cheratussin AC syrup, Highest opioid analgesic regimen found: Hydrocodone/acetaminophen 7.5/325 mg 1 tablet every 4 hours (fill date 03/12/2018) hydrocodone 45 mg/day Most recent opioid analgesic: Oxycodone/acetaminophen 5/325 mg 1 tablet twice daily (fill date 04/26/2018) oxycodone 10 mg/day Current opioid analgesics: Oxycodone/acetaminophen 5/325 mg 1 tablet twice daily (fill date 04/26/2018) oxycodone 10 mg/day Highest recorded MME/day: 45 mg/day MME/day: 10 mg/day Medications: The patient did not bring the medication(s) to the appointment, as requested in our "New Patient Package" Pharmacodynamics: Desired effects: Analgesia: The patient reports >50% benefit. Reported improvement in function: The patient reports medication allows him to accomplish basic ADLs. Clinically meaningful improvement in function (CMIF): Sustained CMIF goals met Perceived effectiveness: Described as relatively effective, allowing for increase in activities of daily living (ADL) Undesirable effects: Side-effects or Adverse reactions: None reported Historical Monitoring: The patient  reports no history of drug use. List of all UDS Test(s): Lab Results  Component Value Date   COCAINSCRNUR Negative 05/01/2015   PCPQUANT Negative 05/01/2015   CANNABQUANT Positive 05/01/2015   List of all Serum Drug Screening Test(s):  Lab Results  Component Value Date   PCPQUANT Negative 05/01/2015   CANNABQUANT Positive 05/01/2015   Historical Background Evaluation: [place] PDMP: Six (6) year initial data search conducted.             [place] Department of public safety, offender search: Editor, commissioning Information) Non-contributory Risk Assessment  Profile: Aberrant behavior: None observed or detected today Risk factors for fatal opioid overdose: None identified today Fatal overdose hazard ratio (HR): Calculation deferred Non-fatal overdose hazard ratio (HR): Calculation deferred Risk of opioid abuse or dependence: 0.7-3.0% with doses ? 36 MME/day and 6.1-26% with doses ? 120 MME/day. Substance use disorder (SUD) risk level: Pending results of Medical Psychology Evaluation for SUD Opioid risk tool (ORT) (Total Score): 4  ORT Scoring interpretation table:  Score <3 = Low Risk for SUD  Score between 4-7 = Moderate Risk for SUD  Score >8 = High Risk for Opioid Abuse   PHQ-2 Depression Scale:  Total score:    PHQ-2 Scoring interpretation table: (Score and probability of major depressive disorder)  Score 0 = No depression  Score 1 = 15.4% Probability  Score 2 = 21.1% Probability  Score 3 = 38.4% Probability  Score 4 = 45.5% Probability  Score 5 = 56.4% Probability  Score 6 = 78.6% Probability   PHQ-9 Depression Scale:  Total score:    PHQ-9 Scoring interpretation table:  Score 0-4 = No depression  Score 5-9 = Mild depression  Score 10-14 = Moderate depression  Score 15-19 = Moderately severe depression  Score 20-27 = Severe depression (2.4 times higher risk of SUD and 2.89 times higher risk of overuse)   Pharmacologic Plan: Pending ordered tests and/or consults  Meds  The patient has a current medication list which includes the following prescription(s): albuterol, amlodipine, bictegravir-emtricitabine-tenofovir af, blood pressure monitor, cvs clotrimazole, desonide, duloxetine, fluticasone furoate-vilanterol, insulin syringe 1cc/29g, mupirocin ointment, NON FORMULARY, pravastatin, ropinirole, sodium chloride, and trazodone.  Current Outpatient Medications on File Prior to Visit  Medication Sig  . albuterol (PROVENTIL HFA;VENTOLIN HFA) 108 (90 Base) MCG/ACT inhaler Inhale  1-2 puffs into the lungs every 4 (four) hours as  needed for wheezing or shortness of breath.  Marland Kitchen amLODipine (NORVASC) 2.5 MG tablet Take 1 tablet (2.5 mg total) by mouth daily.  . bictegravir-emtricitabine-tenofovir AF (BIKTARVY) 50-200-25 MG TABS tablet Take 1 tablet by mouth daily.  . Blood Pressure Monitor KIT 1 kit by Does not apply route as directed.  . [facility] CLOTRIMAZOLE 1 % cream Apply 1 application daily as needed topically.  Marland Kitchen desonide (DESOWEN) 0.05 % cream Apply 1 application 2 (two) times daily topically.  . DULoxetine (CYMBALTA) 60 MG capsule Take 1 capsule (60 mg total) by mouth daily.  . fluticasone furoate-vilanterol (BREO ELLIPTA) 100-25 MCG/INH AEPB Inhale 1 puff into the lungs daily.  . INSULIN SYRINGE 1CC/29G 29G X 1/2" 1 ML MISC 1 each daily by Other route.  . mupirocin ointment (BACTROBAN) 2 % Apply 1 application topically 2 (two) times daily. X 5 days with qtip  . NON FORMULARY Prostaglandin injection 12 mg-29m-9mcg/mL injection  . pravastatin (PRAVACHOL) 40 MG tablet Take 1.5 tablets (60 mg total) by mouth at bedtime. (appt on or after March 27th please)  . rOPINIRole (REQUIP) 1 MG tablet Take 1 tablet (1 mg total) by mouth at bedtime.  . sodium chloride (OCEAN) 0.65 % SOLN nasal spray Place 1-2 sprays into both nostrils daily as needed for congestion.  . traZODone (DESYREL) 50 MG tablet Take 1 tablet (50 mg total) by mouth at bedtime as needed for sleep.   No current facility-administered medications on file prior to visit.    Imaging Review  Lumbosacral Imaging:  Lumbar CT wo contrast:  Results for orders placed during the hospital encounter of 04/22/18  CT LUMBAR SPINE WO CONTRAST   Narrative CLINICAL DATA:  Back pain which has failed conservative therapy  EXAM: CT LUMBAR SPINE WITHOUT CONTRAST  TECHNIQUE: Multidetector CT imaging of the lumbar spine was performed without intravenous contrast administration. Multiplanar CT image reconstructions were also generated.  COMPARISON:   None.  FINDINGS: Segmentation: 5 lumbar type vertebral bodies with incomplete segmentation of the L5 left transverse process from the sacrum.  Alignment: Normal  Vertebrae: Osteopenic appearance. No acute fracture, discitis, or aggressive bone lesion. Avascular necrosis of the right femoral head.  Paraspinal and other soft tissues: Right nephrolithiasis with overlying cortical thinning/scarring. Atherosclerotic calcification.  Spinal stimulator with visible leads in unremarkable position in the dorsal epidural space. These enter the spinal canal at the L1-2 interspinous space.  Disc levels:  T12- L1: Minor spondylosis and disc bulge.  L1-L2: Minor spondylosis and disc bulge.  L2-L3: Mild disc narrowing and bulging with calcification. Mild facet spurring.  L3-L4: Disc narrowing and bulge with calcification. Degenerative facet spurring  L4-L5: Disc narrowing and bulge with calcification. Facet degeneration with joint distortion and spurring.  L5-S1:Mild ridging.  This level is incompletely segmented.  IMPRESSION: 1. Facet degeneration at L3-4 and L4-5, particularly advanced at L4-5. 2. Generalized disc bulging. 3. No evidence of neural impingement. 4. Avascular necrosis of the right femoral head. The bones are diffusely osteopenic.   Electronically Signed   By: JMonte FantasiaM.D.   On: 04/23/2018 07:22   Note: Available results from prior imaging studies were reviewed.        ROS  Cardiovascular History: High blood pressure Pulmonary or Respiratory History: Wheezing and difficulty taking a deep full breath (Asthma) Neurological History: No reported neurological signs or symptoms such as seizures, abnormal skin sensations, urinary and/or fecal incontinence, being born with an abnormal  open spine and/or a tethered spinal cord Review of Past Neurological Studies: No results found for this or any previous visit. Psychological-Psychiatric History: No reported  psychological or psychiatric signs or symptoms such as difficulty sleeping, anxiety, depression, delusions or hallucinations (schizophrenial), mood swings (bipolar disorders) or suicidal ideations or attempts Gastrointestinal History: No reported gastrointestinal signs or symptoms such as vomiting or evacuating blood, reflux, heartburn, alternating episodes of diarrhea and constipation, inflamed or scarred liver, or pancreas or irrregular and/or infrequent bowel movements Genitourinary History: No reported renal or genitourinary signs or symptoms such as difficulty voiding or producing urine, peeing blood, non-functioning kidney, kidney stones, difficulty emptying the bladder, difficulty controlling the flow of urine, or chronic kidney disease Hematological History: No reported hematological signs or symptoms such as prolonged bleeding, low or poor functioning platelets, bruising or bleeding easily, hereditary bleeding problems, low energy levels due to low hemoglobin or being anemic Endocrine History: No reported endocrine signs or symptoms such as high or low blood sugar, rapid heart rate due to high thyroid levels, obesity or weight gain due to slow thyroid or thyroid disease Rheumatologic History: No reported rheumatological signs and symptoms such as fatigue, joint pain, tenderness, swelling, redness, heat, stiffness, decreased range of motion, with or without associated rash Musculoskeletal History: Negative for myasthenia gravis, muscular dystrophy, multiple sclerosis or malignant hyperthermia Work History: Disabled nothing listed  Allergies  Mr. Bures is allergic to fentanyl; shellfish allergy; tomato; bee pollen; penicillin g; pollen extract; aspirin; and dust mite extract.  Laboratory Chemistry  Inflammation Markers Lab Results  Component Value Date   ESRSEDRATE 2 10/26/2013   (CRP: Acute Phase) (ESR: Chronic Phase) Renal Function Markers Lab Results  Component Value Date   BUN 9  03/15/2018   CREATININE 1.03 03/15/2018   GFRAA 81 04/02/2016   GFRNONAA 70 04/02/2016   Hepatic Function Markers Lab Results  Component Value Date   AST 21 03/15/2018   ALT 11 03/15/2018   ALBUMIN 4.4 03/15/2018   ALKPHOS 87 03/15/2018   HCVAB NEGATIVE 12/20/2015   Electrolytes Lab Results  Component Value Date   NA 139 03/15/2018   K 4.6 03/15/2018   CL 103 03/15/2018   CALCIUM 9.7 03/15/2018   MG 2.2 10/26/2013   Neuropathy Markers No results found for: [id number] Bone Pathology Markers Lab Results  Component Value Date   ALKPHOS 87 03/15/2018   VD25OH 58.10 03/15/2018   CALCIUM 9.7 03/15/2018   Coagulation Parameters Lab Results  Component Value Date   PLT 199.0 03/15/2018   Cardiovascular Markers Lab Results  Component Value Date   HGB 15.2 03/15/2018   HCT 45.5 03/15/2018   Note: Lab results reviewed.  [place]  Drug: Mr. Ledesma  reports no history of drug use. Alcohol:  reports current alcohol use. Tobacco:  reports that he quit smoking about 2 years ago. His smoking use included cigarettes. He started smoking about 33 years ago. He has a 30.00 pack-year smoking history. He has never used smokeless tobacco. Medical:  has a past medical history of Allergy, Asthma, Chronic low back pain, Colon polyps, Controlled insomnia, Eczema, Emphysema of lung ([place]) (09/22/2016), History of chicken pox, HIV (human immunodeficiency virus infection) ([place]), HIV infection ([place]), Hyperlipidemia, Hypertension, Numbness in right leg, Osteoarthritis of lumbar spine, Other forms of scoliosis, thoracolumbar region, Restless leg syndrome, Syphilis, and Syphilis. Family: family history includes Alcohol abuse in his father, mother, and sister; Anxiety disorder in his sister; Arthritis in his brother, brother, and mother; COPD in his brother and  sister; Cancer in his father; Depression in his brother, father, mother, and sister; Diabetes in his brother, sister, and sister; Heart disease in  his brother, brother, father, mother, and sister; Hyperlipidemia in his brother, brother, father, mother, sister, and son; Hypertension in his brother, brother, father, mother, sister, and son; Lupus in his mother; Mental illness in his brother and father; Stroke in his sister.  Past Surgical History:  Procedure Laterality Date  . COLONOSCOPY WITH PROPOFOL N/A 12/01/2014   Procedure: COLONOSCOPY WITH PROPOFOL;  Surgeon: [name], MD;  Location: [facility];  Service: Endoscopy;  Laterality: N/A;  . EYE SURGERY  age 76   uncross eyes  . POLYPECTOMY  12/01/2014   Procedure: POLYPECTOMY;  Surgeon: [name], MD;  Location: [facility];  Service: Endoscopy;;  . SPINAL CORD STIMULATOR IMPLANT  01/19/2012   Dr. Mariea Stable model # [id number] serial # [id number] Medtronic   . SPINE SURGERY  01/21/12   implanted medtronics SCS   Active Ambulatory Problems    Diagnosis Date Noted  . Allergic rhinitis 09/12/2014  . Insomnia 01/19/2018  . Anxiety and depression 09/12/2014  . Chronic radicular lumbar pain 09/09/2012  . ED (erectile dysfunction) of organic origin 09/12/2014  . HLD (hyperlipidemia) 09/12/2014  . HTN (hypertension) 01/19/2018  . Spondylosis of lumbar region without myelopathy or radiculopathy 09/09/2012  . Restless leg 09/12/2014  . Vitamin D deficiency 09/12/2014  . History of concussion 04/19/2013  . AD (atopic dermatitis) 04/06/2014  . Scoliosis 09/12/2014  . Hx of colonic polyps   . Benign neoplasm of descending colon 09/25/2016  . Benign neoplasm of sigmoid colon   . Type O blood, Rh negative 04/24/2015  . Chronic lumbar pain 05/01/2015  . Encounter for medication monitoring 11/19/2015  . Abnormal weight loss 11/19/2015  . Rosacea 11/21/2015  . Long term systemic steroid user 12/20/2015  . Hx of myocardial infarction 12/20/2015  . Prostate cancer screening 12/20/2015  . Preventative health care 12/20/2015  . Positive RPR test 12/21/2015  .  Neutropenia ([place]) 01/16/2016  . Personal history of tobacco use, presenting hazards to health 01/20/2016  . Aortic atherosclerosis ([place]) 04/02/2016  . CAD (coronary artery disease) 04/02/2016  . Abnormal CT of the chest 09/22/2016  . Ground glass opacity present on imaging of lung 09/22/2016  . Panlobular emphysema ([place]) 09/22/2016  . HIV (human immunodeficiency virus infection) ([place]) 09/22/2016  . Chronic back pain 01/19/2018  . Discogenic low back pain 09/09/2012  . H/O syncope 11/16/2017  . History of neutropenia 11/16/2017  . Legal circumstances 09/09/2012  . Need for vaccination 04/02/2017  . Encounter for long-term (current) use of medications 12/11/2016  . Screening examination for sexually transmitted disease 10/05/2014  . Chronic bilateral low back pain with bilateral sciatica [facility] Area of Pain) (R>L) 05/17/2018  . Low back pain 09/09/2012  . History of tobacco use 05/17/2018  . Asymptomatic HIV infection ([place]) 05/17/2018  . Chronic pain of both hips (Primary Area of Pain) (R>L) 05/17/2018  . Chronic pain of both lower extremities (Secondary Area of Pain) (R>L) 05/17/2018  . Chronic pain syndrome 05/17/2018  . Long term current use of opiate analgesic 05/17/2018  . Pharmacologic therapy 05/17/2018  . Disorder of skeletal system 05/17/2018  . Problems influencing health status 05/17/2018   Resolved Ambulatory Problems    Diagnosis Date Noted  . Adaptation reaction 09/23/2011  . HIV (human immunodeficiency virus infection) ([place]) 09/12/2014  . Current tobacco use 09/12/2014  . Bronchitis with bronchospasm 09/21/2014  .  History of syphilis 11/15/2014  . Screening for STD (sexually transmitted disease) 11/15/2014  . Encounter for cholesteral screening for cardiovascular disease 11/15/2014  . Encounter for screening for malignant neoplasm of prostate 11/15/2014  . Influenza vaccination declined by patient 11/15/2014  . Encounter for screening for malignant neoplasm of  colon 11/15/2014  . Cigarette smoker 11/15/2014  . Screen for STD (sexually transmitted disease) 12/20/2015  . 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 Allergy   . Asthma   . Chronic low back pain   . Colon polyps   . Controlled insomnia   . Eczema   . Emphysema of lung ([place]) 09/22/2016  . History of chicken pox   . HIV infection ([place])   . Hyperlipidemia   . Hypertension   . Numbness in right leg   . Osteoarthritis of lumbar spine   . Other forms of scoliosis, thoracolumbar region   . Restless leg syndrome   . Syphilis   . Syphilis    Constitutional Exam  General appearance: Well nourished, well developed, and well hydrated. In no apparent acute distress Vitals:   05/17/18 1318  BP: 120/76  Pulse: 72  Temp: 97.6 F (36.4 C)  SpO2: 98%  Weight: 154 lb (69.9 kg)  Height: 5' 11"  (1.803 m)   BMI Assessment: Estimated body mass index is 21.48 kg/m as calculated from the following:   Height as of this encounter: 5' 11"  (1.803 m).   Weight as of this encounter: 154 lb (69.9 kg).  BMI interpretation table: BMI level Category Range association with higher incidence of chronic pain  <18 kg/m2 Underweight   18.5-24.9 kg/m2 Ideal body weight   25-29.9 kg/m2 Overweight Increased incidence by 20%  30-34.9 kg/m2 Obese (Class I) Increased incidence by 68%  35-39.9 kg/m2 Severe obesity (Class II) Increased incidence by 136%  >40 kg/m2 Extreme obesity (Class III) Increased incidence by 254%   BMI Readings from Last 4 Encounters:  05/17/18 21.48 kg/m  04/09/18 21.21 kg/m  03/15/18 21.92 kg/m  01/19/18 21.72 kg/m   Wt Readings from Last 4 Encounters:  05/17/18 154 lb (69.9 kg)  04/09/18 147 lb 12.8 oz (67 kg)  03/15/18 152 lb 12.8 oz (69.3 kg)  01/19/18 151 lb 6.4 oz (68.7 kg)  Psych/Mental status: Alert, oriented x 3 (person, place, & time)       Eyes: PERLA Respiratory: No evidence of acute respiratory  distress  Cervical Spine Exam  Inspection: No masses, redness, or swelling Alignment: Symmetrical Functional ROM: Unrestricted ROM      Stability: No instability detected Muscle strength & Tone: Functionally intact Sensory: Unimpaired Palpation: No palpable anomalies              Upper Extremity (UE) Exam    Side: Right upper extremity  Side: Left upper extremity  Inspection: No masses, redness, swelling, or asymmetry. No contractures  Inspection: No masses, redness, swelling, or asymmetry. No contractures  Functional ROM: Unrestricted ROM          Functional ROM: Unrestricted ROM          Muscle strength & Tone: Functionally intact  Muscle strength & Tone: Functionally intact  Sensory: Unimpaired  Sensory: Unimpaired  Palpation: No palpable anomalies              Palpation: No palpable anomalies              Specialized Test(s): Deferred  Specialized Test(s): Deferred          Thoracic Spine Exam  Inspection: No masses, redness, or swelling Alignment: Symmetrical Functional ROM: Unrestricted ROM Stability: No instability detected Sensory: Unimpaired Muscle strength & Tone: No palpable anomalies  Lumbar Spine Exam  Inspection: Well healed scar from previous spine surgery detected Alignment: Symmetrical Functional ROM: Unrestricted ROM      Stability: No instability detected Muscle strength & Tone: Functionally intact Sensory: Movement-associated pain Palpation: Uncomfortable       Provocative Tests: Lumbar Hyperextension and rotation test: Positive bilaterally for facet joint pain.  Leg raises positive less than 45 degrees bilaterally Patrick's Maneuver: Positive             for bilateral hip arthralgia unable to perform on the right  Gait & Posture Assessment  Ambulation: Unassisted Gait: Antalgic Posture: WNL   Lower Extremity Exam    Side: Right lower extremity  Side: Left lower extremity  Inspection: No masses, redness, swelling, or asymmetry. No contractures   Inspection: No masses, redness, swelling, or asymmetry. No contractures  Functional ROM: Pain restricted ROM for hip joint  Functional ROM: Adequate ROM for hip joint  Muscle strength & Tone: Functionally intact  Muscle strength & Tone: Functionally intact  Sensory: Dermatomal pain pattern  Sensory: Unimpaired  Palpation: Uncomfortable  Palpation: No palpable anomalies   Assessment  Primary Diagnosis & Pertinent Problem List: The primary encounter diagnosis was Chronic pain of both hips (Primary Area of Pain) (R>L). Diagnoses of Chronic pain of both lower extremities (Secondary Area of Pain) (R>L), Chronic bilateral low back pain with bilateral sciatica (Tertiary Area of Pain) (R>L), Chronic pain syndrome, Long term current use of opiate analgesic, Pharmacologic therapy, Disorder of skeletal system, and Problems influencing health status were also pertinent to this visit.  Visit Diagnosis: 1. Chronic pain of both hips (Primary Area of Pain) (R>L)   2. Chronic pain of both lower extremities (Secondary Area of Pain) (R>L)   3. Chronic bilateral low back pain with bilateral sciatica [facility] Area of Pain) (R>L)   4. Chronic pain syndrome   5. Long term current use of opiate analgesic   6. Pharmacologic therapy   7. Disorder of skeletal system   8. Problems influencing health status    Plan of Care  Initial treatment plan:  Please be advised that as per protocol, today's visit has been an evaluation only. We have not taken over the patient's controlled substance management.  Problem-specific plan: No problem-specific Assessment & Plan notes found for this encounter.  Ordered Lab-work, Procedure(s), Referral(s), & Consult(s): Orders Placed This Encounter  Procedures  . Compliance Drug Analysis, Ur  . Comp. Metabolic Panel (12)  . Magnesium  . Vitamin B12  . Sedimentation rate  . C-reactive protein   Pharmacotherapy: Medications ordered:  No orders of the defined types were placed  in this encounter.  Medications administered during this visit: Zenovia Jarred had no medications administered during this visit.   Pharmacotherapy under consideration:  Opioid Analgesics: The patient was informed that there is no guarantee that he would be a candidate for opioid analgesics. The decision will be made following CDC guidelines. This decision will be based on the results of diagnostic studies, as well as Mr. Abarca risk profile.  Membrane stabilizer: To be determined at a later time Muscle relaxant: To be determined at a later time NSAID: To be determined at a later time Other analgesic(s): To be determined at a later time   Interventional  therapies under consideration: Mr. Petraglia was informed that there is no guarantee that he would be a candidate for interventional therapies. The decision will be based on the results of diagnostic studies, as well as Mr. Banko risk profile.  Possible procedure(s): Diagnostic bilateral intra-articular hip injection Diagnostic midline lumbar epidural steroid injection Diagnostic bilateral lumbar facet nerve block Possible bilateral lumbar facet radiofrequency ablation   Provider-requested follow-up: Return for 2nd Visit, w/ Dr. Dossie Arbour, medical record release.  Future Appointments  Date Time Provider [place]  06/16/2018 10:30 AM Milinda Pointer, MD [facility] None  06/22/2018  3:00 PM McLean-Scocuzza, Nino Glow, MD [facility]    Primary Care Physician: McLean-Scocuzza, Nino Glow, MD Location: [facility] Outpatient [facility] Management Facility Note by:  Date: 05/17/2018; Time: 3:49 PM  Pain Score Disclaimer: We use the NRS-11 scale. This is a self-reported, subjective measurement of pain severity with only modest accuracy. It is used primarily to identify changes within a particular patient. It must be understood that outpatient pain scales are significantly less accurate that those used for research, where they can be applied  under ideal controlled circumstances with minimal exposure to variables. In reality, the score is likely to be a combination of pain intensity and pain affect, where pain affect describes the degree of emotional arousal or changes in action readiness caused by the sensory experience of pain. Factors such as social and work situation, setting, emotional state, anxiety levels, expectation, and prior pain experience may influence pain perception and show large inter-individual differences that may also be affected by time variables.  Patient instructions provided during this appointment: Patient Instructions   ____________________________________________________________________________________________  Appointment Policy Summary  It is our goal and responsibility to provide the medical community with assistance in the evaluation and management of patients with chronic pain. Unfortunately our resources are limited. Because we do not have an unlimited amount of time, or available appointments, we are required to closely monitor and manage their use. The following rules exist to maximize their use:  Patient's responsibilities: 1. Punctuality:  At what time should I arrive? You should be physically present in our office 30 minutes before your scheduled appointment. Your scheduled appointment is with your assigned healthcare provider. However, it takes 5-10 minutes to be "checked-in", and another 15 minutes for the nurses to do the admission. If you arrive to our office at the time you were given for your appointment, you will end up being at least 20-25 minutes late to your appointment with the provider. 2. Tardiness:  What happens if I arrive only a few minutes after my scheduled appointment time? You will need to reschedule your appointment. The cutoff is your appointment time. This is why it is so important that you arrive at least 30 minutes before that appointment. If you have an appointment scheduled  for 10:00 AM and you arrive at 10:01, you will be required to reschedule your appointment.  3. Plan ahead:  Always assume that you will encounter traffic on your way in. Plan for it. If you are dependent on a driver, make sure they understand these rules and the need to arrive early. 4. Other appointments and responsibilities:  Avoid scheduling any other appointments before or after your pain clinic appointments.  5. Be prepared:  Write down everything that you need to discuss with your healthcare provider and give this information to the admitting nurse. Write down the medications that you will need refilled. Bring your pills and bottles (even the empty ones), to all  of your appointments, except for those where a procedure is scheduled. 6. No children or pets:  Find someone to take care of them. It is not appropriate to bring them in. 7. Scheduling changes:  We request "advanced notification" of any changes or cancellations. 8. Advanced notification:  Defined as a time period of more than 24 hours prior to the originally scheduled appointment. This allows for the appointment to be offered to other patients. 9. Rescheduling:  When a visit is rescheduled, it will require the cancellation of the original appointment. For this reason they both fall within the category of "Cancellations".  10. Cancellations:  They require advanced notification. Any cancellation less than 24 hours before the  appointment will be recorded as a "No Show". 11. No Show:  Defined as an unkept appointment where the patient failed to notify or declare to the practice their intention or inability to keep the appointment.  Corrective process for repeat offenders:  1. Tardiness: Three (3) episodes of rescheduling due to late arrivals will be recorded as one (1) "No Show". 2. Cancellation or reschedule: Three (3) cancellations or rescheduling will be recorded as one (1) "No Show". 3. "No Shows": Three (3) "No Shows" within a  12 month period will result in discharge from the practice. ____________________________________________________________________________________________   ______________________________________________________________________________________________  Specialty Pain Scale  Introduction:  There are significant differences in how pain is reported. The word pain usually refers to physical pain, but it is also a common synonym of suffering. The medical community uses a scale from 0 (zero) to 10 (ten) to report pain level. Zero (0) is described as "no pain", while ten (10) is described as "the worse pain you can imagine". The problem with this scale is that physical pain is reported along with suffering. Suffering refers to mental pain, or more often yet it refers to any unpleasant feeling, emotion or aversion associated with the perception of harm or threat of harm. It is the psychological component of pain.  Pain Specialists prefer to separate the two components. The pain scale used by this practice is the Verbal Numerical Rating Scale (VNRS-11). This scale is for the physical pain only. DO NOT INCLUDE how your pain psychologically affects you. This scale is for adults 12 years of age and older. It has 11 (eleven) levels. The 1st level is 0/10. This means: "right now, I have no pain". In the context of pain management, it also means: "right now, my physical pain is under control with the current therapy".  General Information:  The scale should reflect your current level of pain. Unless you are specifically asked for the level of your worst pain, or your average pain. If you are asked for one of these two, then it should be understood that it is over the past 24 hours.  Levels 1 (one) through 5 (five) are described below, and can be treated as an outpatient. Ambulatory pain management facilities such as ours are more than adequate to treat these levels. Levels 6 (six) through 10 (ten) are also described  below, however, these must be treated as a hospitalized patient. While levels 6 (six) and 7 (seven) may be evaluated at an urgent care facility, levels 8 (eight) through 10 (ten) constitute medical emergencies and as such, they belong in a hospital's emergency department. When having these levels (as described below), do not come to our office. Our facility is not equipped to manage these levels. Go directly to an urgent care facility or an emergency  department to be evaluated.  Definitions:  Activities of Daily Living (ADL): Activities of daily living (ADL or ADLs) is a term used in healthcare to refer to people's daily self-care activities. Health professionals often use a person's ability or inability to perform ADLs as a measurement of their functional status, particularly in regard to people post injury, with disabilities and the elderly. There are two ADL levels: Basic and Instrumental. Basic Activities of Daily Living (BADL  or BADLs) consist of self-care tasks that include: Bathing and showering; personal hygiene and grooming (including brushing/combing/styling hair); dressing; Toilet hygiene (getting to the toilet, cleaning oneself, and getting back up); eating and self-feeding (not including cooking or chewing and swallowing); functional mobility, often referred to as "transferring", as measured by the ability to walk, get in and out of bed, and get into and out of a chair; the broader definition (moving from one place to another while performing activities) is useful for people with different physical abilities who are still able to get around independently. Basic ADLs include the things many people do when they get up in the morning and get ready to go out of the house: get out of bed, go to the toilet, bathe, dress, groom, and eat. On the average, loss of function typically follows a particular order. Hygiene is the first to go, followed by loss of toilet use and locomotion. The last to go is the  ability to eat. When there is only one remaining area in which the person is independent, there is a 62.9% chance that it is eating and only a 3.5% chance that it is hygiene. Instrumental Activities of Daily Living (IADL or IADLs) are not necessary for fundamental functioning, but they let an individual live independently in a community. IADL consist of tasks that include: cleaning and maintaining the house; home establishment and maintenance; care of others (including selecting and supervising caregivers); care of pets; child rearing; managing money; managing financials (investments, etc.); meal preparation and cleanup; shopping for groceries and necessities; moving within the community; safety procedures and emergency responses; health management and maintenance (taking prescribed medications); and using the telephone or other form of communication.  Instructions:  Most patients tend to report their pain as a combination of two factors, their physical pain and their psychosocial pain. This last one is also known as "suffering" and it is reflection of how physical pain affects you socially and psychologically. From now on, report them separately.  From this point on, when asked to report your pain level, report only your physical pain. Use the following table for reference.  Pain Clinic Pain Levels (0-5/10)  Pain Level Score  Description  No Pain 0   Mild pain 1 Nagging, annoying, but does not interfere with basic activities of daily living (ADL). Patients are able to eat, bathe, get dressed, toileting (being able to get on and off the toilet and perform personal hygiene functions), transfer (move in and out of bed or a chair without assistance), and maintain continence (able to control bladder and bowel functions). Blood pressure and heart rate are unaffected. A normal heart rate for a healthy adult ranges from 60 to 100 bpm (beats per minute).   Mild to moderate pain 2 Noticeable and distracting.  Impossible to hide from other people. More frequent flare-ups. Still possible to adapt and function close to normal. It can be very annoying and may have occasional stronger flare-ups. With discipline, patients may get used to it and adapt.   Moderate pain  3 Interferes significantly with activities of daily living (ADL). It becomes difficult to feed, bathe, get dressed, get on and off the toilet or to perform personal hygiene functions. Difficult to get in and out of bed or a chair without assistance. Very distracting. With effort, it can be ignored when deeply involved in activities.   Moderately severe pain 4 Impossible to ignore for more than a few minutes. With effort, patients may still be able to manage work or participate in some social activities. Very difficult to concentrate. Signs of autonomic nervous system discharge are evident: dilated pupils (mydriasis); mild sweating (diaphoresis); sleep interference. Heart rate becomes elevated (>115 bpm). Diastolic blood pressure (lower number) rises above 100 mmHg. Patients find relief in laying down and not moving.   Severe pain 5 Intense and extremely unpleasant. Associated with frowning face and frequent crying. Pain overwhelms the senses.  Ability to do any activity or maintain social relationships becomes significantly limited. Conversation becomes difficult. Pacing back and forth is common, as getting into a comfortable position is nearly impossible. Pain wakes you up from deep sleep. Physical signs will be obvious: pupillary dilation; increased sweating; goosebumps; brisk reflexes; cold, clammy hands and feet; nausea, vomiting or dry heaves; loss of appetite; significant sleep disturbance with inability to fall asleep or to remain asleep. When persistent, significant weight loss is observed due to the complete loss of appetite and sleep deprivation.  Blood pressure and heart rate becomes significantly elevated. Caution: If elevated blood pressure  triggers a pounding headache associated with blurred vision, then the patient should immediately seek attention at an urgent or emergency care unit, as these may be signs of an impending stroke.    Emergency Department Pain Levels (6-10/10)  Emergency Room Pain 6 Severely limiting. Requires emergency care and should not be seen or managed at an outpatient pain management facility. Communication becomes difficult and requires great effort. Assistance to reach the emergency department may be required. Facial flushing and profuse sweating along with potentially dangerous increases in heart rate and blood pressure will be evident.   Distressing pain 7 Self-care is very difficult. Assistance is required to transport, or use restroom. Assistance to reach the emergency department will be required. Tasks requiring coordination, such as bathing and getting dressed become very difficult.   Disabling pain 8 Self-care is no longer possible. At this level, pain is disabling. The individual is unable to do even the most "basic" activities such as walking, eating, bathing, dressing, transferring to a bed, or toileting. Fine motor skills are lost. It is difficult to think clearly.   Incapacitating pain 9 Pain becomes incapacitating. Thought processing is no longer possible. Difficult to remember your own name. Control of movement and coordination are lost.   The worst pain imaginable 10 At this level, most patients pass out from pain. When this level is reached, collapse of the autonomic nervous system occurs, leading to a sudden drop in blood pressure and heart rate. This in turn results in a temporary and dramatic drop in blood flow to the brain, leading to a loss of consciousness. Fainting is one of the body's self defense mechanisms. Passing out puts the brain in a calmed state and causes it to shut down for a while, in order to begin the healing process.    Summary: 1.   Refer to this scale when providing [place]  with your pain level. 2.   Be accurate and careful when reporting your pain level. This will help with your  care. 3.   Over-reporting your pain level will lead to loss of credibility. 4.   Even a level of 1/10 means that there is pain and will be treated at our facility. 5.   High, inaccurate reporting will be documented as "Symptom Exaggeration", leading to loss of credibility and suspicions of possible secondary gains such as obtaining more narcotics, or wanting to appear disabled, for fraudulent reasons. 6.   Only pain levels of 5 or below will be seen at our facility. 7.   Pain levels of 6 and above will be sent to the Emergency Department and the appointment cancelled.  ______________________________________________________________________________________________  Dennis Bast have been instructed to get labwork and UDS today.

## 2018-05-18 DIAGNOSIS — M545 Low back pain: Secondary | ICD-10-CM | POA: Diagnosis not present

## 2018-05-18 DIAGNOSIS — G8929 Other chronic pain: Secondary | ICD-10-CM | POA: Diagnosis not present

## 2018-05-18 LAB — COMP. METABOLIC PANEL (12)
AST: 26 IU/L (ref 0–40)
Albumin/Globulin Ratio: 1.6 (ref 1.2–2.2)
Albumin: 4.4 g/dL (ref 3.8–4.9)
Alkaline Phosphatase: 111 IU/L (ref 39–117)
BUN/Creatinine Ratio: 8 — ABNORMAL LOW (ref 9–20)
BUN: 9 mg/dL (ref 6–24)
Bilirubin Total: 0.6 mg/dL (ref 0.0–1.2)
Calcium: 9.4 mg/dL (ref 8.7–10.2)
Chloride: 101 mmol/L (ref 96–106)
Creatinine, Ser: 1.08 mg/dL (ref 0.76–1.27)
GFR calc Af Amer: 86 mL/min/{1.73_m2} (ref >59)
GFR calc non Af Amer: 75 mL/min/{1.73_m2} (ref >59)
Globulin, Total: 2.8 g/dL (ref 1.5–4.5)
Glucose: 92 mg/dL (ref 65–99)
Potassium: 4.4 mmol/L (ref 3.5–5.2)
Sodium: 141 mmol/L (ref 134–144)
Total Protein: 7.2 g/dL (ref 6.0–8.5)

## 2018-05-18 LAB — SEDIMENTATION RATE: Sed Rate: 9 mm/hr (ref 0–30)

## 2018-05-18 LAB — VITAMIN B12: Vitamin B-12: 652 pg/mL (ref 232–1245)

## 2018-05-18 LAB — MAGNESIUM: Magnesium: 2.1 mg/dL (ref 1.6–2.3)

## 2018-05-18 LAB — C-REACTIVE PROTEIN: CRP: 2 mg/L (ref 0–10)

## 2018-05-21 LAB — COMPLIANCE DRUG ANALYSIS, UR

## 2018-05-24 ENCOUNTER — Encounter: Payer: Self-pay | Admitting: Nurse Practitioner

## 2018-05-24 DIAGNOSIS — F1490 Cocaine use, unspecified, uncomplicated: Secondary | ICD-10-CM | POA: Insufficient documentation

## 2018-05-24 DIAGNOSIS — F1290 Cannabis use, unspecified, uncomplicated: Secondary | ICD-10-CM | POA: Insufficient documentation

## 2018-06-16 ENCOUNTER — Other Ambulatory Visit: Payer: Self-pay

## 2018-06-16 ENCOUNTER — Ambulatory Visit: Payer: Medicare HMO | Attending: Pain Medicine | Admitting: Pain Medicine

## 2018-06-16 DIAGNOSIS — Z87891 Personal history of nicotine dependence: Secondary | ICD-10-CM

## 2018-06-16 DIAGNOSIS — M51379 Other intervertebral disc degeneration, lumbosacral region without mention of lumbar back pain or lower extremity pain: Secondary | ICD-10-CM | POA: Insufficient documentation

## 2018-06-16 DIAGNOSIS — M47816 Spondylosis without myelopathy or radiculopathy, lumbar region: Secondary | ICD-10-CM | POA: Insufficient documentation

## 2018-06-16 DIAGNOSIS — F1990 Other psychoactive substance use, unspecified, uncomplicated: Secondary | ICD-10-CM

## 2018-06-16 DIAGNOSIS — G894 Chronic pain syndrome: Secondary | ICD-10-CM

## 2018-06-16 DIAGNOSIS — Z789 Other specified health status: Secondary | ICD-10-CM

## 2018-06-16 DIAGNOSIS — M8949 Other hypertrophic osteoarthropathy, multiple sites: Secondary | ICD-10-CM

## 2018-06-16 DIAGNOSIS — M5441 Lumbago with sciatica, right side: Secondary | ICD-10-CM

## 2018-06-16 DIAGNOSIS — M150 Primary generalized (osteo)arthritis: Secondary | ICD-10-CM | POA: Diagnosis not present

## 2018-06-16 DIAGNOSIS — M5137 Other intervertebral disc degeneration, lumbosacral region: Secondary | ICD-10-CM | POA: Diagnosis not present

## 2018-06-16 DIAGNOSIS — M87051 Idiopathic aseptic necrosis of right femur: Secondary | ICD-10-CM

## 2018-06-16 DIAGNOSIS — M79604 Pain in right leg: Secondary | ICD-10-CM

## 2018-06-16 DIAGNOSIS — M899 Disorder of bone, unspecified: Secondary | ICD-10-CM

## 2018-06-16 DIAGNOSIS — M7918 Myalgia, other site: Secondary | ICD-10-CM | POA: Diagnosis not present

## 2018-06-16 DIAGNOSIS — M5442 Lumbago with sciatica, left side: Secondary | ICD-10-CM

## 2018-06-16 DIAGNOSIS — G8929 Other chronic pain: Secondary | ICD-10-CM

## 2018-06-16 DIAGNOSIS — M79605 Pain in left leg: Secondary | ICD-10-CM

## 2018-06-16 DIAGNOSIS — F1290 Cannabis use, unspecified, uncomplicated: Secondary | ICD-10-CM

## 2018-06-16 DIAGNOSIS — M5416 Radiculopathy, lumbar region: Secondary | ICD-10-CM

## 2018-06-16 DIAGNOSIS — M25552 Pain in left hip: Secondary | ICD-10-CM

## 2018-06-16 DIAGNOSIS — M159 Polyosteoarthritis, unspecified: Secondary | ICD-10-CM

## 2018-06-16 DIAGNOSIS — M25551 Pain in right hip: Secondary | ICD-10-CM | POA: Diagnosis not present

## 2018-06-16 DIAGNOSIS — Z79891 Long term (current) use of opiate analgesic: Secondary | ICD-10-CM

## 2018-06-16 DIAGNOSIS — F1490 Cocaine use, unspecified, uncomplicated: Secondary | ICD-10-CM

## 2018-06-16 DIAGNOSIS — Z21 Asymptomatic human immunodeficiency virus [HIV] infection status: Secondary | ICD-10-CM

## 2018-06-16 DIAGNOSIS — Z7952 Long term (current) use of systemic steroids: Secondary | ICD-10-CM

## 2018-06-16 DIAGNOSIS — I252 Old myocardial infarction: Secondary | ICD-10-CM

## 2018-06-16 DIAGNOSIS — Z79899 Other long term (current) drug therapy: Secondary | ICD-10-CM

## 2018-06-16 HISTORY — DX: Other chronic pain: G89.29

## 2018-06-16 HISTORY — DX: Idiopathic aseptic necrosis of right femur: M87.051

## 2018-06-16 HISTORY — DX: Other psychoactive substance use, unspecified, uncomplicated: F19.90

## 2018-06-16 HISTORY — DX: Myalgia, other site: M79.18

## 2018-06-16 HISTORY — DX: Spondylosis without myelopathy or radiculopathy, lumbar region: M47.816

## 2018-06-16 MED ORDER — MELOXICAM 15 MG PO TABS: 15.0000 mg | ORAL_TABLET | Freq: Every day | ORAL | 2 refills | Status: DC

## 2018-06-16 MED ORDER — TIZANIDINE HCL 4 MG PO TABS: 4.0000 mg | ORAL_TABLET | Freq: Three times a day (TID) | ORAL | 0 refills | Status: DC | PRN

## 2018-06-16 NOTE — Progress Notes (Signed)
Patient's Name: Samuel Burnett  MRN: [id number]  Referring Provider: Orland Mustard *  DOB: Jan 22, 1961  PCP: McLean-Scocuzza, Nino Glow, MD  DOS: 06/16/2018  Note by: Gaspar Cola, MD  Service setting: Virtual Visit (Telephone)  Attending: Gaspar Cola, MD  Location: Telephone Encounter  Specialty: Interventional Pain Management  Patient type: Established   Pain Management Encounter Note - Virtual Visit via Telephone Telehealth (real-time audio visits between healthcare provider and patient).  Patient's Phone No.:  [phone] (home); [phone] (mobile); (Preferred) [phone]  Pre-screening note:  Our staff contacted Mr. Christopherson and offered him an "in person", "face-to-face" appointment versus a telephone encounter. He indicated preferring the telephone encounter, at this time.   Primary Reason(s) for Virtual Visit: Encounter for evaluation before starting new chronic pain management plan of care (Level of risk: moderate) COVID-19*  Social distancing based on CDC ans AMA recommendations.   I contacted Zenovia Jarred on 06/16/2018 at 1:42 PM by telephone and clearly identified myself as Gaspar Cola, MD. I verified that I was speaking with the correct person using two identifiers (Name and date of birth: Dec 15, 1960).  Advanced Informed Consent I sought verbal advanced consent from Zenovia Jarred for telemedicine interactions and virtual visit. I informed Mr. Finlay of the security and privacy concerns, risks, and limitations associated with performing an evaluation and management service by telephone. I also informed Mr. Valdez of the availability of "in person" appointments and I informed him of the possibility of a patient responsible charge related to this service. Mr. Baldyga expressed understanding and agreed to proceed.   Historic Elements   Mr. Jaelin Kohrs is a 58 y.o. year old, male patient evaluated today after his last encounter by our practice on  05/17/2018. Mr. Perine  has a past medical history of Allergy, Asthma, Chronic low back pain, Colon polyps, Controlled insomnia, Eczema, Emphysema of lung ([place]) (09/22/2016), History of chicken pox, HIV (human immunodeficiency virus infection) ([place]), HIV infection ([place]), Hyperlipidemia, Hypertension, Numbness in right leg, Osteoarthritis of lumbar spine, Other forms of scoliosis, thoracolumbar region, Restless leg syndrome, Syphilis, and Syphilis. He also  has a past surgical history that includes Spine surgery (01/21/12); Eye surgery (age 62); Colonoscopy with propofol (N/A, 12/01/2014); polypectomy (12/01/2014); and Spinal cord stimulator implant (01/19/2012). Mr. Rosamond has a current medication list which includes the following prescription(s): albuterol, amlodipine, bictegravir-emtricitabine-tenofovir af, blood pressure monitor, cvs clotrimazole, desonide, duloxetine, fluticasone furoate-vilanterol, insulin syringe 1cc/29g, meloxicam, mupirocin ointment, NON FORMULARY, pravastatin, ropinirole, sodium chloride, tizanidine, and trazodone. He  reports that he quit smoking about 2 years ago. His smoking use included cigarettes. He started smoking about 33 years ago. He has a 30.00 pack-year smoking history. He has never used smokeless tobacco. He reports current alcohol use. He reports that he does not use drugs. Mr. Cornia is allergic to fentanyl; shellfish allergy; tomato; bee pollen; penicillin g; pollen extract; aspirin; and dust mite extract.   HPI  He is being evaluated for review of studies ordered on initial visit and to consider treatment plan options. Today I went over the results of his tests. These were explained in "Layman's terms". During today's appointment I went over my diagnostic impression, as well as the proposed treatment plan.  According to the patient his primary area of pain is in his hips (B) (R>L).  He admits the right is worse than the left.  He does have pain that goes down his whole  leg.  He has had injections in the past which were not effective.  He has been seen recently by [facility] clinic orthopedist and is in the need for a right total hip replacement.  Has had recent x-rays and is expected to have surgery in 1 month.  Second area of pain is in his legs (B) (R>L).  He admits the right is worse than the left.  Pain goes down the whole right leg down into the side of his foot.  He has numbness.  He denies previous nerve conduction study.  He does have left-sided leg pain that goes just into the thigh and groin area.  His third area of pain is in his lower back (B) (R>L).  He admits that he had a spinal cord stimulator placed in 2013.  This was done at the pain clinic in [place].  He admits that the right side of his back is worse than the left.  He has had nerve blocks and a radiofrequency which were not effective.  Denies any recent physical therapy.  He did have a CT scan of his lumbar spine recently.  In considering the treatment plan options, Mr. Furey was reminded that I no longer take patients for medication management only. I asked him to let me know if he had no intention of taking advantage of the interventional therapies, so that we could make arrangements to provide this space to someone interested. I also made it clear that undergoing interventional therapies for the purpose of getting pain medications is very inappropriate on the part of a patient, and it will not be tolerated in this practice. This type of behavior would suggest true addiction and therefore it requires referral to an addiction specialist.   I discussed the assessment and treatment plan with the patient. The patient was provided an opportunity to ask questions and all were answered. The patient agreed with the plan and demonstrated an understanding of the instructions.  Patient advised to call back or seek an in-person evaluation if the symptoms or condition worsens.  Controlled Substance  Pharmacotherapy Assessment REMS (Risk Evaluation and Mitigation Strategy)  Analgesic: Oxycodone/acetaminophen 5/325 mg 1 tablet twice daily (fill date 04/26/2018) oxycodone 10 mg/day Highest recorded MME/day: 45 mg/day MME/day: 10 mg/day   Monitoring: [place] PMP: PDMP reviewed during this encounter.       Not applicable at this point since we have not taken over the patient's medication management yet. List of other Serum/Urine Drug Screening Test(s):  Lab Results  Component Value Date   COCAINSCRNUR Negative 05/01/2015   CANNABQUANT Positive 05/01/2015   List of all UDS test(s) done:  Lab Results  Component Value Date   SUMMARY FINAL 05/17/2018   Last UDS on record: Summary  Date Value Ref Range Status  05/17/2018 FINAL  Final    Comment:    ==================================================================== TOXASSURE COMP DRUG ANALYSIS,UR ==================================================================== Test                             Result       Flag       Units Drug Present and Declared for Prescription Verification   Duloxetine                     PRESENT      EXPECTED   Trazodone                      PRESENT      EXPECTED   1,3 chlorophenyl piperazine  PRESENT      EXPECTED    1,3-chlorophenyl piperazine is an expected metabolite of    trazodone. Drug Present not Declared for Prescription Verification   Benzoylecgonine                42           UNEXPECTED ng/mg creat    Benzoylecgonine is a metabolite of cocaine; its presence    indicates use of this drug.  Source is most commonly illicit, but    cocaine is present in some topical anesthetic solutions.   Carboxy-THC                    86           UNEXPECTED ng/mg creat    Carboxy-THC is a metabolite of tetrahydrocannabinol  (THC).    Source of [facility] is most commonly illicit, but THC is also present    in a scheduled prescription medication.   Diphenhydramine                PRESENT      UNEXPECTED Drug Absent but  Declared for Prescription Verification   Oxycodone                      Not Detected UNEXPECTED ng/mg creat   Acetaminophen                  Not Detected UNEXPECTED    Acetaminophen, as indicated in the declared medication list, is    not always detected even when used as directed. ==================================================================== Test                      Result    Flag   Units      Ref Range   Creatinine              236              mg/dL      >=20 ==================================================================== Declared Medications:  The flagging and interpretation on this report are based on the  following declared medications.  Unexpected results may arise from  inaccuracies in the declared medications.  **Note: The testing scope of this panel includes these medications:  Duloxetine (Cymbalta)  Oxycodone (Oxycodone Acetaminophen)  Trazodone (Desyrel)  **Note: The testing scope of this panel does not include small to  moderate amounts of these reported medications:  Acetaminophen (Oxycodone Acetaminophen)  **Note: The testing scope of this panel does not include following  reported medications:  Albuterol  Alprostadil  Amlodipine Besylate  Bictegravir (Biktarvy)  Desonide  Emtricitabine (Biktarvy)  Fluticasone (Breo)  Fluticasone (Flonase)  Ipratropium (Atrovent)  Loratadine (Claritin)  Montelukast (Singulair)  Mupirocin (Bactroban)  Papaverine  Phentolamine  Pravastatin  Ropinirole (Requip)  Saline ([place] Nasal Spray)  Tenofovir (Biktarvy)  Topical Clotrimazole  Vilanterol (Breo) ==================================================================== For clinical consultation, please call [phone]. ====================================================================    UDS interpretation: Unexpected findings: Undeclared illicit substance detected Medication Assessment Form: Not applicable. No opioids. Treatment compliance: Not  applicable Risk Assessment Profile: Aberrant behavior: claims that "nothing else works" and use of illicit substances Comorbid factors increasing risk of overdose: COPD or asthma, history of substance abuse, history of substance use disorder, male gender and nicotine dependence Opioid risk tool (ORT):  Opioid Risk  05/17/2018  Alcohol 3  Illegal Drugs 0  Rx Drugs 0  Alcohol 0  Illegal Drugs 0 (Untrue, as proven by (+) UDS)  Rx Drugs 0  Age between 12-45 years  1  History of Preadolescent Sexual Abuse 0  Psychological Disease 0  Depression 0  Opioid Risk Tool Scoring 4  Opioid Risk Interpretation Moderate Risk    ORT Scoring interpretation table:  Score <3 = Low Risk for SUD  Score between 4-7 = Moderate Risk for SUD  Score >8 = High Risk for Opioid Abuse   Risk of substance use disorder (SUD): Very High  Risk Mitigation Strategies:  Patient opioid safety counseling: No controlled substances prescribed. Patient-Prescriber Agreement (PPA): No agreement signed.  Controlled substance notification to other providers: None required. No opioid therapy.  Pharmacologic Plan: Non-opioid analgesic therapy offered.             Meds   Current Outpatient Medications:  .  albuterol (PROVENTIL HFA;VENTOLIN HFA) 108 (90 Base) MCG/ACT inhaler, Inhale 1-2 puffs into the lungs every 4 (four) hours as needed for wheezing or shortness of breath., Disp: 1 Inhaler, Rfl: 5 .  amLODipine (NORVASC) 2.5 MG tablet, Take 1 tablet (2.5 mg total) by mouth daily., Disp: 90 tablet, Rfl: 3 .  bictegravir-emtricitabine-tenofovir AF (BIKTARVY) 50-200-25 MG TABS tablet, Take 1 tablet by mouth daily., Disp: , Rfl:  .  Blood Pressure Monitor KIT, 1 kit by Does not apply route as directed., Disp: 1 each, Rfl: 0 .  [facility]S CLOTRIMAZOLE 1 % cream, Apply 1 application daily as needed topically., Disp: , Rfl: 11 .  desonide (DESOWEN) 0.05 % cream, Apply 1 application 2 (two) times daily topically., Disp: , Rfl: 3 .   DULoxetine (CYMBALTA) 60 MG capsule, Take 1 capsule (60 mg total) by mouth daily., Disp: 90 capsule, Rfl: 1 .  fluticasone furoate-vilanterol (BREO ELLIPTA) 100-25 MCG/INH AEPB, Inhale 1 puff into the lungs daily., Disp: 3 each, Rfl: 3 .  INSULIN SYRINGE 1CC/29G 29G X 1/2" 1 ML MISC, 1 each daily by Other route., Disp: , Rfl:  .  meloxicam (MOBIC) 15 MG tablet, Take 1 tablet (15 mg total) by mouth daily., Disp: 30 tablet, Rfl: 2 .  mupirocin ointment (BACTROBAN) 2 %, Apply 1 application topically 2 (two) times daily. X 5 days with qtip, Disp: 22 g, Rfl: 0 .  NON FORMULARY, Prostaglandin injection 12 mg-65m-9mcg/mL injection, Disp: , Rfl:  .  pravastatin (PRAVACHOL) 40 MG tablet, Take 1.5 tablets (60 mg total) by mouth at bedtime. (appt on or after March 27th please), Disp: 135 tablet, Rfl: 0 .  rOPINIRole (REQUIP) 1 MG tablet, Take 1 tablet (1 mg total) by mouth at bedtime., Disp: 90 tablet, Rfl: 3 .  sodium chloride (OCEAN) 0.65 % SOLN nasal spray, Place 1-2 sprays into both nostrils daily as needed for congestion., Disp: 1 Bottle, Rfl: 12 .  tiZANidine (ZANAFLEX) 4 MG tablet, Take 1 tablet (4 mg total) by mouth every 8 (eight) hours as needed for muscle spasms., Disp: 90 tablet, Rfl: 0 .  traZODone (DESYREL) 50 MG tablet, Take 1 tablet (50 mg total) by mouth at bedtime as needed for sleep., Disp: 90 tablet, Rfl: 3  Laboratory Chemistry  Inflammation Markers (CRP: Acute Phase) (ESR: Chronic Phase) Lab Results  Component Value Date   CRP 2 05/17/2018   ESRSEDRATE 9 05/17/2018                         Rheumatology Markers No results found.  Renal Function Markers Lab Results  Component Value Date   BUN 9 05/17/2018   CREATININE 1.08 05/17/2018  BCR 8 (L) 05/17/2018   GFRAA 86 05/17/2018   GFRNONAA 75 05/17/2018                             Hepatic Function Markers Lab Results  Component Value Date   AST 26 05/17/2018   ALT 11 03/15/2018   ALBUMIN 4.4 05/17/2018   ALKPHOS 111  05/17/2018   HCVAB NEGATIVE 12/20/2015                        Electrolytes Lab Results  Component Value Date   NA 141 05/17/2018   K 4.4 05/17/2018   CL 101 05/17/2018   CALCIUM 9.4 05/17/2018   MG 2.1 05/17/2018                        Neuropathy Markers Lab Results  Component Value Date   VITAMINB12 652 05/17/2018   HGBA1C 6.1 (A) 05/12/2014                        CNS Tests No results found.  Bone Pathology Markers Lab Results  Component Value Date   VD25OH 58.10 03/15/2018                         Coagulation Parameters Lab Results  Component Value Date   PLT 199.0 03/15/2018                        Cardiovascular Markers Lab Results  Component Value Date   HGB 15.2 03/15/2018   HCT 45.5 03/15/2018                         ID Markers No results found.  CA Markers No results found.  Endocrine Markers Lab Results  Component Value Date   TSH 1.48 03/15/2018                        Note: Lab results reviewed.  Recent Diagnostic Imaging Review  Lumbosacral Imaging: Lumbar CT wo contrast:  Results for orders placed during the hospital encounter of 04/22/18  CT LUMBAR SPINE WO CONTRAST   Narrative CLINICAL DATA:  Back pain which has failed conservative therapy  EXAM: CT LUMBAR SPINE WITHOUT CONTRAST  TECHNIQUE: Multidetector CT imaging of the lumbar spine was performed without intravenous contrast administration. Multiplanar CT image reconstructions were also generated.  COMPARISON:  None.  FINDINGS: Segmentation: 5 lumbar type vertebral bodies with incomplete segmentation of the L5 left transverse process from the sacrum.  Alignment: Normal  Vertebrae: Osteopenic appearance. No acute fracture, discitis, or aggressive bone lesion. Avascular necrosis of the right femoral head.  Paraspinal and other soft tissues: Right nephrolithiasis with overlying cortical thinning/scarring. Atherosclerotic calcification.  Spinal stimulator with visible  leads in unremarkable position in the dorsal epidural space. These enter the spinal canal at the L1-2 interspinous space.  Disc levels:  T12- L1: Minor spondylosis and disc bulge.  L1-L2: Minor spondylosis and disc bulge.  L2-L3: Mild disc narrowing and bulging with calcification. Mild facet spurring.  L3-L4: Disc narrowing and bulge with calcification. Degenerative facet spurring  L4-L5: Disc narrowing and bulge with calcification. Facet degeneration with joint distortion and spurring.  L5-S1:Mild ridging.  This level is incompletely segmented.  IMPRESSION: 1. Facet degeneration at L3-4 and  L4-5, particularly advanced at L4-5. 2. Generalized disc bulging. 3. No evidence of neural impingement. 4. Avascular necrosis of the right femoral head. The bones are diffusely osteopenic.   Electronically Signed   By: Monte Fantasia M.D.   On: 04/23/2018 07:22    Complexity Note: Imaging results reviewed. Results shared with Mr. Kumpf, using Layman's terms.                         Assessment  The primary encounter diagnosis was Chronic pain syndrome. 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), Chronic lower extremity pain (Secondary Area of Pain) (Bilateral) (R>L), Chronic low back pain (Tertiary Area of Pain) (Bilateral) (R>L) w/ sciatica  (Bilateral), Chronic radicular lumbar pain (Right), Lumbar spondylosis, DDD (degenerative disc disease), lumbosacral, Lumbar facet arthropathy (Bilateral), Lumbar facet syndrome (Bilateral), Osteoarthritis involving multiple joints, Chronic musculoskeletal pain, Disorder of skeletal system, Problems influencing health status, Asymptomatic HIV infection ([place]), Hx of myocardial infarction, History of tobacco use, Cocaine use, Marijuana use, Substance use disorder, Pharmacologic therapy, Long term systemic steroid user, and Long term current use of opiate analgesic were also  pertinent to this visit.  Plan of Care  I am having Johndavid Tignor start on meloxicam and tiZANidine. I am also having him maintain his Blood Pressure Monitor, DULoxetine, INSULIN SYRINGE 1CC/29G, desonide, [facility] Clotrimazole, pravastatin, fluticasone furoate-vilanterol, albuterol, rOPINIRole, traZODone, bictegravir-emtricitabine-tenofovir AF, amLODipine, NON FORMULARY, sodium chloride, and mupirocin ointment. Pharmacotherapy (Medications Ordered): Meds ordered this encounter  Medications  . meloxicam (MOBIC) 15 MG tablet    Sig: Take 1 tablet (15 mg total) by mouth daily.    Dispense:  30 tablet    Refill:  2    Do not add this medication to the electronic "Automatic Refill" notification system. Patient may have prescription filled one day early if pharmacy is closed on scheduled refill date.  Marland Kitchen tiZANidine (ZANAFLEX) 4 MG tablet    Sig: Take 1 tablet (4 mg total) by mouth every 8 (eight) hours as needed for muscle spasms.    Dispense:  90 tablet    Refill:  0    Do not place this medication, or any other prescription from our practice, on "Automatic Refill". Patient may have prescription filled one day early if pharmacy is closed on scheduled refill date.   Procedure Orders    No procedure(s) ordered today   Lab Orders  No laboratory test(s) ordered today   Imaging Orders  No imaging studies ordered today   Referral Orders  No referral(s) requested today    Orders:  No orders of the defined types were placed in this encounter.  Pharmacological management options:  Opioid Analgesics: I will not be prescribing any opioids at this time. Very high risk for SUD. We will not be witting for any opioids. Membrane stabilizer: None prescribed at this time Muscle relaxant: Zanaflex 4 mg every 8 hour trial NSAID: Mobic 15 mg p.o. daily trial Other analgesic(s): None prescribed at this time   Interventional management options: Planned, scheduled, and/or pending:    None at this point  secondary to COVID-19 restrictions.   Considering:   Diagnostic bilateral intra-articular hip injection  Diagnostic midline lumbar epidural steroid injection  Diagnostic bilateral lumbar facet nerve block  Possible bilateral lumbar facet RFA    PRN Procedures:   None at this time   Total duration of non-face-to-face encounter: 22 minutes.  Follow-up plan:  Return if symptoms worsen or fail to improve.    Future Appointments  Date Time Provider [place]  06/22/2018  3:00 PM McLean-Scocuzza, Nino Glow, MD [facility]    Primary Care Physician: McLean-Scocuzza, Nino Glow, MD Location: Telephone Virtual Visit Note by: Gaspar Cola, MD Date: 06/16/2018; Time: 1:42 PM  Disclaimer:  * Given the special circumstances of the COVID-19 pandemic, the federal government has announced that the Office for Civil Rights (OCR) will exercise its enforcement discretion and will not impose penalties on physicians using telehealth in the event of noncompliance with regulatory requirements under the [place] and Accountability Act (HIPAA) in connection with the good faith provision of telehealth during the LKJZP-91 national public health emergency. ([place])

## 2018-06-22 ENCOUNTER — Encounter: Payer: Self-pay | Admitting: Internal Medicine

## 2018-06-22 ENCOUNTER — Ambulatory Visit (INDEPENDENT_AMBULATORY_CARE_PROVIDER_SITE_OTHER): Payer: Medicare HMO | Admitting: Internal Medicine

## 2018-06-22 DIAGNOSIS — B20 Human immunodeficiency virus [HIV] disease: Secondary | ICD-10-CM

## 2018-06-22 DIAGNOSIS — M25551 Pain in right hip: Secondary | ICD-10-CM

## 2018-06-22 DIAGNOSIS — G8929 Other chronic pain: Secondary | ICD-10-CM | POA: Diagnosis not present

## 2018-06-22 DIAGNOSIS — N529 Male erectile dysfunction, unspecified: Secondary | ICD-10-CM | POA: Diagnosis not present

## 2018-06-22 DIAGNOSIS — M5442 Lumbago with sciatica, left side: Secondary | ICD-10-CM | POA: Diagnosis not present

## 2018-06-22 DIAGNOSIS — M25552 Pain in left hip: Secondary | ICD-10-CM

## 2018-06-22 DIAGNOSIS — I10 Essential (primary) hypertension: Secondary | ICD-10-CM

## 2018-06-22 DIAGNOSIS — N5203 Combined arterial insufficiency and corporo-venous occlusive erectile dysfunction: Secondary | ICD-10-CM | POA: Diagnosis not present

## 2018-06-22 DIAGNOSIS — M5441 Lumbago with sciatica, right side: Secondary | ICD-10-CM | POA: Diagnosis not present

## 2018-06-22 DIAGNOSIS — R69 Illness, unspecified: Secondary | ICD-10-CM | POA: Diagnosis not present

## 2018-06-22 DIAGNOSIS — Z21 Asymptomatic human immunodeficiency virus [HIV] infection status: Secondary | ICD-10-CM

## 2018-06-22 MED ORDER — DULOXETINE HCL 60 MG PO CPEP: 60.0000 mg | ORAL_CAPSULE | Freq: Every day | ORAL | 3 refills | Status: DC

## 2018-06-22 NOTE — Progress Notes (Signed)
Virtual Visit via Video Note [place]  I connected with Samuel Burnett   on 06/22/18 at  3:07 PM EDT by a video enabled telemedicine application and verified that I am speaking with the correct person using two identifiers.  Location patient: home Location provider:work Persons participating in the virtual visit: patient, provider  I discussed the limitations of evaluation and management by telemedicine and the availability of in person appointments. The patient expressed understanding and agreed to proceed.   HPI: F/u  1. Nasal drainage improved he reports it happened after he helped to embalm an person who died and was 800 lbs  2. Chronic back and hip pain pending hip replacement with Dr. Rudene Christians 07/2018 maybe moved back AVN hip saw Dr. Margette Fast, Dr. Lacinda Axon, pain clinic for chronic pain and given mobic 15 mg qd prn and zanaflex 4 mg bid prn and trying a back brace which helps at times.  3. ED on Trim[facility] per urology had telemedicine appt 06/22/2018  4. HTN taking BP meds norvasc 2.5 mg qd  5. HIV f/u ID 11/2018 and taking meds     ROS: See pertinent positives and negatives per HPI.  Past Medical History:  Diagnosis Date  . Allergy   . Asthma   . Chronic low back pain   . Colon polyps    tubular and hyperplastic Dr. Allen Norris   . Controlled insomnia   . Eczema   . Emphysema of lung ([place]) 09/22/2016  . History of chicken pox   . HIV (human immunodeficiency virus infection) ([place])   . HIV infection ([place])   . Hyperlipidemia   . Hypertension   . Numbness in right leg    outside of right foot, related to medical device implant in spine  . Osteoarthritis of lumbar spine    also- hips  . Other forms of scoliosis, thoracolumbar region   . Restless leg syndrome   . Syphilis   . Syphilis     Past Surgical History:  Procedure Laterality Date  . COLONOSCOPY WITH PROPOFOL N/A 12/01/2014   Procedure: COLONOSCOPY WITH PROPOFOL;  Surgeon: [name], MD;  Location: [facility];  Service:  Endoscopy;  Laterality: N/A;  . EYE SURGERY  age 60   uncross eyes  . POLYPECTOMY  12/01/2014   Procedure: POLYPECTOMY;  Surgeon: [name], MD;  Location: [facility];  Service: Endoscopy;;  . SPINAL CORD STIMULATOR IMPLANT  01/19/2012   Dr. Mariea Stable model # [id number] serial # [id number] Medtronic   . SPINE SURGERY  01/21/12   implanted medtronics SCS    Family History  Problem Relation Age of Onset  . Lupus Mother   . Alcohol abuse Mother   . Arthritis Mother   . Depression Mother   . Heart disease Mother   . Hyperlipidemia Mother   . Hypertension Mother   . Cancer Father        Pancreatis  . Depression Father   . Alcohol abuse Father   . Heart disease Father   . Hyperlipidemia Father   . Hypertension Father   . Mental illness Father   . Diabetes Sister   . Arthritis Brother   . COPD Brother   . Depression Brother   . Heart disease Brother   . Hyperlipidemia Brother   . Hypertension Brother   . Hyperlipidemia Son   . Hypertension Son   . Diabetes Sister   . Hypertension Sister   . Stroke Sister   . Alcohol abuse Sister   .  Anxiety disorder Sister   . COPD Sister   . Depression Sister   . Heart disease Sister   . Hyperlipidemia Sister   . Diabetes Brother   . Arthritis Brother   . Heart disease Brother   . Hyperlipidemia Brother   . Hypertension Brother   . Mental illness Brother     SOCIAL HX: has a dog    Current Outpatient Medications:  .  albuterol (PROVENTIL HFA;VENTOLIN HFA) 108 (90 Base) MCG/ACT inhaler, Inhale 1-2 puffs into the lungs every 4 (four) hours as needed for wheezing or shortness of breath., Disp: 1 Inhaler, Rfl: 5 .  amLODipine (NORVASC) 2.5 MG tablet, Take 1 tablet (2.5 mg total) by mouth daily., Disp: 90 tablet, Rfl: 3 .  bictegravir-emtricitabine-tenofovir AF (BIKTARVY) 50-200-25 MG TABS tablet, Take 1 tablet by mouth daily., Disp: , Rfl:  .  Blood Pressure Monitor KIT, 1 kit by Does not apply route as directed., Disp: 1  each, Rfl: 0 .  [facility] CLOTRIMAZOLE 1 % cream, Apply 1 application daily as needed topically., Disp: , Rfl: 11 .  desonide (DESOWEN) 0.05 % cream, Apply 1 application 2 (two) times daily topically., Disp: , Rfl: 3 .  DULoxetine (CYMBALTA) 60 MG capsule, Take 1 capsule (60 mg total) by mouth daily., Disp: 90 capsule, Rfl: 3 .  fluticasone furoate-vilanterol (BREO ELLIPTA) 100-25 MCG/INH AEPB, Inhale 1 puff into the lungs daily., Disp: 3 each, Rfl: 3 .  INSULIN SYRINGE 1CC/29G 29G X 1/2" 1 ML MISC, 1 each daily by Other route., Disp: , Rfl:  .  meloxicam (MOBIC) 15 MG tablet, Take 1 tablet (15 mg total) by mouth daily., Disp: 30 tablet, Rfl: 2 .  mupirocin ointment (BACTROBAN) 2 %, Apply 1 application topically 2 (two) times daily. X 5 days with qtip, Disp: 22 g, Rfl: 0 .  NON FORMULARY, Prostaglandin injection 12 mg-16m-9mcg/mL injection, Disp: , Rfl:  .  pravastatin (PRAVACHOL) 40 MG tablet, Take 1.5 tablets (60 mg total) by mouth at bedtime. (appt on or after March 27th please), Disp: 135 tablet, Rfl: 0 .  rOPINIRole (REQUIP) 1 MG tablet, Take 1 tablet (1 mg total) by mouth at bedtime., Disp: 90 tablet, Rfl: 3 .  sodium chloride (OCEAN) 0.65 % SOLN nasal spray, Place 1-2 sprays into both nostrils daily as needed for congestion., Disp: 1 Bottle, Rfl: 12 .  tiZANidine (ZANAFLEX) 4 MG tablet, Take 1 tablet (4 mg total) by mouth every 8 (eight) hours as needed for muscle spasms., Disp: 90 tablet, Rfl: 0 .  traZODone (DESYREL) 50 MG tablet, Take 1 tablet (50 mg total) by mouth at bedtime as needed for sleep., Disp: 90 tablet, Rfl: 3  EXAM:  VITALS per patient if applicable:  GENERAL: alert, oriented, appears well and in no acute distress  HEENT: atraumatic, conjunttiva clear, no obvious abnormalities on inspection of external nose and ears  NECK: normal movements of the head and neck  LUNGS: on inspection no signs of respiratory distress, breathing rate appears normal, no obvious gross SOB, gasping  or wheezing  CV: no obvious cyanosis  MS: moves all visible extremities without noticeable abnormality  PSYCH/NEURO: pleasant and cooperative, no obvious depression or anxiety, speech and thought processing grossly intact  ASSESSMENT AND PLAN:  Discussed the following assessment and plan:  Other chronic pain - Plan: DULoxetine (CYMBALTA) 60 MG capsule refilled  Cont meds f/u with pain clinic and ortho and PM&R prn   Chronic hip pain (Primary Area of Pain) (Bilateral) (R>L) 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 Rudene Christians sch 07/2018 but may be pushed back 2/2 COVID 19   Chronic low back pain (Tertiary Area of Pain) (Bilateral) (R>L) w/ sciatica  (Bilateral)-f/u pain clinic and PM&R   ED (erectile dysfunction) of organic origin cont urology f/u   Essential hypertension norvasc 2.5 mg qd   HIV infection, unspecified symptom status ([place]) f/u id 11/11/2018   HM Declines flu shot  utd hep A, prevnar 09/09/12, pna 23 09/29/13, shingrix 2/2  Tdap last 12/20/15  Colonoscopy 12/01/14 Dr. Allen Norris tubular and hyperplastic polyps q5 years  Former smoker  Last eye exam 2018  Hep BsAb 163 08/02/09 HCV neg 09/29/13  PSA 03/15/2018 0.83   I discussed the assessment and treatment plan with the patient. The patient was provided an opportunity to ask questions and all were answered. The patient agreed with the plan and demonstrated an understanding of the instructions.   The patient was advised to call back or seek an in-person evaluation if the symptoms worsen or if the condition fails to improve as anticipated.  Time spent 15 minutes   Delorise Jackson, MD

## 2018-07-01 ENCOUNTER — Encounter: Payer: Self-pay | Admitting: Family Medicine

## 2018-07-08 ENCOUNTER — Other Ambulatory Visit: Payer: Self-pay | Admitting: Pain Medicine

## 2018-07-08 DIAGNOSIS — G8929 Other chronic pain: Secondary | ICD-10-CM

## 2018-07-08 DIAGNOSIS — M5441 Lumbago with sciatica, right side: Principal | ICD-10-CM

## 2018-07-08 DIAGNOSIS — M7918 Myalgia, other site: Secondary | ICD-10-CM

## 2018-07-08 DIAGNOSIS — M5442 Lumbago with sciatica, left side: Principal | ICD-10-CM

## 2018-07-29 ENCOUNTER — Telehealth: Payer: Self-pay

## 2018-07-29 NOTE — Telephone Encounter (Signed)
Copied from [place] [id number]. Topic: Quick Communication - See Telephone Encounter >> Jul 29, 2018 11:48 AM [place] wrote: CRM for notification. See Telephone encounter for: 07/29/18. Called 3 times no answer/ PT wanting appt for a health assessment with Dr Olivia Mackie due to new insurance please call him back to schedule as soon as possible at [phone]

## 2018-07-29 NOTE — Telephone Encounter (Signed)
Please continue to call the patient and see what he needs not sure this means   [place]

## 2018-07-30 NOTE — Telephone Encounter (Signed)
Pt scheduled a physical in July with the provider, he requested a reminder in the mail, I printed and mailed that reminder to pt today.  Chinmayi Rumer,cma

## 2018-09-06 ENCOUNTER — Telehealth: Payer: Self-pay | Admitting: Internal Medicine

## 2018-09-06 NOTE — Telephone Encounter (Signed)
Pt needs added Claritin, remove flonaze. Pt now uses [facility] in [place]. Thank you!  Needs refills on all meds.

## 2018-09-08 ENCOUNTER — Other Ambulatory Visit: Payer: Self-pay | Admitting: Internal Medicine

## 2018-09-08 DIAGNOSIS — J309 Allergic rhinitis, unspecified: Secondary | ICD-10-CM

## 2018-09-08 DIAGNOSIS — G4700 Insomnia, unspecified: Secondary | ICD-10-CM

## 2018-09-08 DIAGNOSIS — E782 Mixed hyperlipidemia: Secondary | ICD-10-CM

## 2018-09-08 DIAGNOSIS — I252 Old myocardial infarction: Secondary | ICD-10-CM

## 2018-09-08 DIAGNOSIS — J4530 Mild persistent asthma, uncomplicated: Secondary | ICD-10-CM

## 2018-09-08 DIAGNOSIS — G8929 Other chronic pain: Secondary | ICD-10-CM

## 2018-09-08 DIAGNOSIS — G2581 Restless legs syndrome: Secondary | ICD-10-CM

## 2018-09-08 DIAGNOSIS — I10 Essential (primary) hypertension: Secondary | ICD-10-CM

## 2018-09-08 DIAGNOSIS — F17210 Nicotine dependence, cigarettes, uncomplicated: Secondary | ICD-10-CM

## 2018-09-08 MED ORDER — TRAZODONE HCL 50 MG PO TABS: 50.0000 mg | ORAL_TABLET | Freq: Every evening | ORAL | 3 refills | Status: DC | PRN

## 2018-09-08 MED ORDER — AMLODIPINE BESYLATE 2.5 MG PO TABS: 2.5000 mg | ORAL_TABLET | Freq: Every day | ORAL | 3 refills | Status: DC

## 2018-09-08 MED ORDER — LORATADINE 10 MG PO TABS: 10.0000 mg | ORAL_TABLET | Freq: Every day | ORAL | 3 refills | Status: DC | PRN

## 2018-09-08 MED ORDER — ROPINIROLE HCL 1 MG PO TABS: 1.0000 mg | ORAL_TABLET | Freq: Every day | ORAL | 3 refills | Status: DC

## 2018-09-08 MED ORDER — ROPINIROLE HCL 1 MG PO TABS: 1.0000 mg | ORAL_TABLET | Freq: Every day | ORAL | 0 refills | Status: DC

## 2018-09-08 MED ORDER — PRAVASTATIN SODIUM 40 MG PO TABS: 40.0000 mg | ORAL_TABLET | Freq: Every day | ORAL | 3 refills | Status: DC

## 2018-09-08 MED ORDER — DULOXETINE HCL 60 MG PO CPEP: 60.0000 mg | ORAL_CAPSULE | Freq: Every day | ORAL | 3 refills | Status: DC

## 2018-09-08 MED ORDER — BREO ELLIPTA 100-25 MCG/INH IN AEPB: 1.0000 | INHALATION_SPRAY | Freq: Every day | RESPIRATORY_TRACT | 4 refills | Status: DC

## 2018-09-08 MED ORDER — ALBUTEROL SULFATE HFA 108 (90 BASE) MCG/ACT IN AERS: 1.0000 | INHALATION_SPRAY | RESPIRATORY_TRACT | 4 refills | Status: DC | PRN

## 2018-09-08 NOTE — Telephone Encounter (Signed)
Clarified it is [facility] and sent meds there   [place]

## 2018-09-09 ENCOUNTER — Encounter: Payer: Medicare Other | Admitting: Internal Medicine

## 2018-09-16 ENCOUNTER — Other Ambulatory Visit: Payer: Self-pay | Admitting: Internal Medicine

## 2018-09-16 DIAGNOSIS — J449 Chronic obstructive pulmonary disease, unspecified: Secondary | ICD-10-CM

## 2018-09-16 MED ORDER — ALBUTEROL SULFATE HFA 108 (90 BASE) MCG/ACT IN AERS: 1.0000 | INHALATION_SPRAY | Freq: Four times a day (QID) | RESPIRATORY_TRACT | 4 refills | Status: DC | PRN

## 2018-10-01 ENCOUNTER — Other Ambulatory Visit: Payer: Self-pay | Admitting: Internal Medicine

## 2018-10-01 DIAGNOSIS — G2581 Restless legs syndrome: Secondary | ICD-10-CM

## 2018-10-01 MED ORDER — ROPINIROLE HCL 1 MG PO TABS: 1.0000 mg | ORAL_TABLET | Freq: Every day | ORAL | 0 refills | Status: DC

## 2018-10-15 ENCOUNTER — Other Ambulatory Visit: Payer: Self-pay

## 2018-10-20 ENCOUNTER — Encounter: Payer: Medicare Other | Admitting: Internal Medicine

## 2018-10-20 DIAGNOSIS — Z0289 Encounter for other administrative examinations: Secondary | ICD-10-CM

## 2018-11-02 ENCOUNTER — Telehealth: Payer: Self-pay | Admitting: Internal Medicine

## 2018-11-02 NOTE — Telephone Encounter (Signed)
Pt just switched mail order pharmacies and wants to make sure that this pravastatin (PRAVACHOL) 40 MG tablet is the correct medication he should be taking as his old bottle says something different from his new bottle.  Pt would like to speak with a nurse.

## 2018-11-03 NOTE — Telephone Encounter (Signed)
Called patient to clarify. Stated he was sent pravastatin instead of atorvastatin. I do not see where he has ever been on atorvastatin.

## 2018-11-11 ENCOUNTER — Other Ambulatory Visit: Payer: Self-pay | Admitting: Internal Medicine

## 2018-11-11 DIAGNOSIS — R220 Localized swelling, mass and lump, head: Secondary | ICD-10-CM | POA: Diagnosis not present

## 2018-11-11 DIAGNOSIS — Z79899 Other long term (current) drug therapy: Secondary | ICD-10-CM | POA: Diagnosis not present

## 2018-11-11 DIAGNOSIS — M87051 Idiopathic aseptic necrosis of right femur: Secondary | ICD-10-CM | POA: Diagnosis not present

## 2018-11-11 DIAGNOSIS — G8929 Other chronic pain: Secondary | ICD-10-CM

## 2018-11-11 DIAGNOSIS — M25551 Pain in right hip: Secondary | ICD-10-CM | POA: Diagnosis not present

## 2018-11-11 DIAGNOSIS — Z87891 Personal history of nicotine dependence: Secondary | ICD-10-CM | POA: Diagnosis not present

## 2018-11-11 DIAGNOSIS — Z862 Personal history of diseases of the blood and blood-forming organs and certain disorders involving the immune mechanism: Secondary | ICD-10-CM | POA: Diagnosis not present

## 2018-11-11 NOTE — Telephone Encounter (Signed)
He is ok with taking the lipitor. [facility] wants [facility] [place]

## 2018-11-11 NOTE — Telephone Encounter (Signed)
It appears he has been on pravastatin since 2016 and 40 mg of pravastatin since 2018  Does he want lipitor instead?

## 2018-11-12 NOTE — Telephone Encounter (Signed)
For now I dont see a reason to change him to lipitor and he can stay of pravachol is what I am saying  [place]

## 2018-11-12 NOTE — Telephone Encounter (Signed)
Patient was informed.  Patient understood and no questions, comments, or concerns at this time.  

## 2018-12-06 ENCOUNTER — Telehealth: Payer: Self-pay

## 2018-12-06 ENCOUNTER — Ambulatory Visit: Payer: Medicare Other

## 2018-12-06 NOTE — Telephone Encounter (Signed)
Called patient at agreed upon scheduled time. No answer. Left a message to call and reschedule.

## 2018-12-07 ENCOUNTER — Telehealth: Payer: Self-pay

## 2018-12-07 NOTE — Telephone Encounter (Signed)
Copied from [place] [phone]. Topic: Appointment Scheduling - Scheduling Inquiry for Clinic >> Dec 07, 2018  8:41 AM Samuel Burnett wrote: Reason for CRM:   Pt missed his AWV yesterday and wants to reschedule for today.

## 2018-12-07 NOTE — Telephone Encounter (Signed)
Noted. Will get this patient rescheduled.

## 2018-12-10 ENCOUNTER — Other Ambulatory Visit: Payer: Self-pay

## 2018-12-10 ENCOUNTER — Ambulatory Visit (INDEPENDENT_AMBULATORY_CARE_PROVIDER_SITE_OTHER): Payer: Medicare Other

## 2018-12-10 DIAGNOSIS — Z0000 Encounter for general adult medical examination without abnormal findings: Secondary | ICD-10-CM | POA: Diagnosis not present

## 2018-12-10 NOTE — Patient Instructions (Addendum)
  Samuel Burnett , Thank you for taking time to come for your Medicare Wellness Visit. I appreciate your ongoing commitment to your health goals. Please review the following plan we discussed and let me know if I can assist you in the future.   These are the goals we discussed: Goals      Patient Stated   . DIET - INCREASE WATER INTAKE (pt-stated)     I plan to drink more water       This is a list of the screening recommended for you and due dates:  Health Maintenance  Topic Date Due  . Flu Shot  06/08/2019*  . Colon Cancer Screening  12/01/2019  . Tetanus Vaccine  12/19/2025  . HIV Screening  Completed  .  Hepatitis C: One time screening is recommended by Center for Disease Control  (CDC) for  adults born from [birth year] through 1967.   Addressed  *Topic was postponed. The date shown is not the original due date.

## 2018-12-10 NOTE — Progress Notes (Signed)
Subjective:   Samuel Burnett is a 58 y.o. male who presents for an Initial Medicare Annual Wellness Visit.  Review of Systems  No ROS.  Medicare Wellness Virtual Visit.  Visual/audio telehealth visit, UTA vital signs.   See social history for additional risk factors.   Cardiac Risk Factors include: advanced age (>32mn, >>59women);hypertension;male gender    Objective:    Today's Vitals   There is no height or weight on file to calculate BMI.  Advanced Directives 12/10/2018 09/29/2016 09/19/2016 08/20/2016 04/02/2016 12/20/2015 11/19/2015  Does Patient Have a Medical Advance Directive? No No No No Yes No Yes  Type of Advance Directive - - - - - Living will Living will;Healthcare Power of Attorney  Does patient want to make changes to medical advance directive? - - - - - - No - Patient declined  Copy of [place] Chart? - - - - - No - copy requested No - copy requested  Would patient like information on creating a medical advance directive? No - Patient declined - - - - - -    Current Medications (verified) Outpatient Encounter Medications as of 12/10/2018  Medication Sig  . albuterol (PROAIR HFA) 108 (90 Base) MCG/ACT inhaler Inhale 1-2 puffs into the lungs every 6 (six) hours as needed for wheezing or shortness of breath.  .Marland KitchenamLODipine (NORVASC) 2.5 MG tablet Take 1 tablet (2.5 mg total) by mouth daily.  . bictegravir-emtricitabine-tenofovir AF (BIKTARVY) 50-200-25 MG TABS tablet Take 1 tablet by mouth daily.  . Blood Pressure Monitor KIT 1 kit by Does not apply route as directed.  . [facility] CLOTRIMAZOLE 1 % cream Apply 1 application daily as needed topically.  .Marland Kitchendesonide (DESOWEN) 0.05 % cream Apply 1 application 2 (two) times daily topically.  . DULoxetine (CYMBALTA) 60 MG capsule Take 1 capsule (60 mg total) by mouth daily.  . fluticasone furoate-vilanterol (BREO ELLIPTA) 100-25 MCG/INH AEPB Inhale 1 puff into the lungs daily.  . INSULIN SYRINGE 1CC/29G 29G X  1/2" 1 ML MISC 1 each daily by Other route.  . loratadine (CLARITIN) 10 MG tablet Take 1 tablet (10 mg total) by mouth daily as needed for allergies.  . meloxicam (MOBIC) 15 MG tablet Take 1 tablet (15 mg total) by mouth daily.  . mupirocin ointment (BACTROBAN) 2 % Apply 1 application topically 2 (two) times daily. X 5 days with qtip  . NON FORMULARY Prostaglandin injection 12 mg-158m9mcg/mL injection  . pravastatin (PRAVACHOL) 40 MG tablet Take 1 tablet (40 mg total) by mouth daily. At night  . rOPINIRole (REQUIP) 1 MG tablet Take 1 tablet (1 mg total) by mouth at bedtime.  . Marland KitchenOPINIRole (REQUIP) 1 MG tablet Take 1 tablet (1 mg total) by mouth at bedtime.  . sodium chloride (OCEAN) 0.65 % SOLN nasal spray Place 1-2 sprays into both nostrils daily as needed for congestion.  . Marland KitcheniZANidine (ZANAFLEX) 4 MG tablet Take 1 tablet (4 mg total) by mouth every 8 (eight) hours as needed for muscle spasms.  . traZODone (DESYREL) 50 MG tablet Take 1 tablet (50 mg total) by mouth at bedtime as needed for sleep.   No facility-administered encounter medications on file as of 12/10/2018.     Allergies (verified) Fentanyl, Shellfish allergy, Tomato, Bee pollen, Penicillin g, Pollen extract, Aspirin, and Dust mite extract   History: Past Medical History:  Diagnosis Date  . Allergy   . Asthma   . Chronic low back pain   . Colon polyps  tubular and hyperplastic Dr. Allen Norris   . Controlled insomnia   . Eczema   . Emphysema of lung ([place]) 09/22/2016  . History of chicken pox   . HIV (human immunodeficiency virus infection) ([place])   . HIV infection ([place])   . Hyperlipidemia   . Hypertension   . Numbness in right leg    outside of right foot, related to medical device implant in spine  . Osteoarthritis of lumbar spine    also- hips  . Other forms of scoliosis, thoracolumbar region   . Restless leg syndrome   . Syphilis   . Syphilis    Past Surgical History:  Procedure Laterality Date  . COLONOSCOPY WITH  PROPOFOL N/A 12/01/2014   Procedure: COLONOSCOPY WITH PROPOFOL;  Surgeon: [name], MD;  Location: [facility];  Service: Endoscopy;  Laterality: N/A;  . EYE SURGERY  age 67   uncross eyes  . POLYPECTOMY  12/01/2014   Procedure: POLYPECTOMY;  Surgeon: [name], MD;  Location: [facility];  Service: Endoscopy;;  . SPINAL CORD STIMULATOR IMPLANT  01/19/2012   Dr. Mariea Stable model # [id number] serial # [id number] Medtronic   . SPINE SURGERY  01/21/12   implanted medtronics SCS   Family History  Problem Relation Age of Onset  . Lupus Mother   . Alcohol abuse Mother   . Arthritis Mother   . Depression Mother   . Heart disease Mother   . Hyperlipidemia Mother   . Hypertension Mother   . Cancer Father        Pancreatis  . Depression Father   . Alcohol abuse Father   . Heart disease Father   . Hyperlipidemia Father   . Hypertension Father   . Mental illness Father   . Diabetes Sister   . Arthritis Brother   . COPD Brother   . Depression Brother   . Heart disease Brother   . Hyperlipidemia Brother   . Hypertension Brother   . Hyperlipidemia Son   . Hypertension Son   . Diabetes Sister   . Hypertension Sister   . Stroke Sister   . Alcohol abuse Sister   . Anxiety disorder Sister   . COPD Sister   . Depression Sister   . Heart disease Sister   . Hyperlipidemia Sister   . Diabetes Brother   . Arthritis Brother   . Heart disease Brother   . Hyperlipidemia Brother   . Hypertension Brother   . Mental illness Brother    Social History   Socioeconomic History  . Marital status: Single    Spouse name: Not on file  . Number of children: Not on file  . Years of education: Not on file  . Highest education level: Not on file  Occupational History  . Not on file  Social Needs  . Financial resource strain: Not hard at all  . Food insecurity    Worry: Never true    Inability: Never true  . Transportation needs    Medical: No    Non-medical: No  Tobacco  Use  . Smoking status: Former Smoker    Packs/day: 1.00    Years: 30.00    Pack years: 30.00    Types: Cigarettes    Start date: 09/11/1984    Quit date: 11/08/2015    Years since quitting: 3.0  . Smokeless tobacco: Never Used  Substance and Sexual Activity  . Alcohol use: Yes    Alcohol/week: 0.0 standard drinks    Comment:  may drink on "special occasions"  . Drug use: No  . Sexual activity: Yes  Lifestyle  . Physical activity    Days per week: Not on file    Minutes per session: Not on file  . Stress: Not on file  Relationships  . Social Herbalist on phone: Not on file    Gets together: Not on file    Attends religious service: Not on file    Active member of club or organization: Not on file    Attends meetings of clubs or organizations: Not on file    Relationship status: Not on file  Other Topics Concern  . Not on file  Social History Narrative   Single    Former smoker    Disability    Former Ex [place], wears seat belt, safe in relationship    Tobacco Counseling Counseling given: Not Answered   Clinical Intake:  Pre-visit preparation completed: Yes        Diabetes: No  How often do you need to have someone help you when you read instructions, pamphlets, or other written materials from your doctor or pharmacy?: 1 - Never  Interpreter Needed?: No     Activities of Daily Living In your present state of health, do you have any difficulty performing the following activities: 12/10/2018  Hearing? N  Vision? N  Difficulty concentrating or making decisions? N  Walking or climbing stairs? N  Dressing or bathing? N  Doing errands, shopping? N  Preparing Food and eating ? N  Using the Toilet? N  In the past six months, have you accidently leaked urine? N  Do you have problems with loss of bowel control? N  Managing your Medications? N  Managing your Finances? N  Housekeeping or managing your Housekeeping? N  Some recent  data might be hidden     Immunizations and Health Maintenance Immunization History  Administered Date(s) Administered  . Hepatitis A 02/20/2011, 09/25/2011  . Hepatitis A, Adult 02/20/2011, 09/25/2011  . Influenza, Seasonal, Injecte, Preservative Fre 02/09/2013  . Influenza-Unspecified 12/17/2010, 02/20/2011, 12/04/2011, 11/12/2017  . Pneumococcal Conjugate-13 09/09/2012  . Pneumococcal Polysaccharide-23 10/24/2008, 09/29/2013  . Tdap 04/06/2014, 12/20/2015  . Zoster Recombinat (Shingrix) 11/12/2017, 05/13/2018   There are no preventive care reminders to display for this patient.  Patient Care Team: McLean-Scocuzza, Nino Glow, MD as PCP - General (Internal Medicine) Delma Post, MD as Referring Physician (Infectious Diseases)  Indicate any recent Medical Services you may have received from other than [facility] providers in the past year (date may be approximate).    Assessment:   This is a routine wellness examination for Samuel Burnett.  I connected with patient 12/10/18 at 11:30 AM EDT by an audio enabled telemedicine application and verified that I am speaking with the correct person using two identifiers. Patient stated full name and DOB. Patient gave permission to continue with virtual visit. Patient's location was at home and Nurse's location was at [place] office.   Health Maintenance Due: -Influenza vaccine 2020- discussed; to be completed in season with doctor or local pharmacy.   Update all pending maintenance due as appropriate.   See completed HM at the end of note.   Eye: Visual acuity not assessed. Virtual visit. Wears corrective lenses.   Dental: UTD   Dentures- partials  Hearing: Demonstrates normal hearing during visit.  Safety:  Patient feels safe at home- yes Patient does have smoke detectors at home- yes Patient does  wear sunscreen or protective clothing when in direct sunlight - yes Patient does wear seat belt when in a moving vehicle - yes Patient  drives- yes Adequate lighting in walkways free from debris- yes Grab bars and handrails used as appropriate- yes Ambulates with no assistive device Cell phone or lifeline/life alert/medic alert on person when ambulating outside of the home- yes  Social: Alcohol intake - yes      Smoking history- former   Smokers in home? none Illicit drug use? none  Depression: PHQ 2 &9 complete. See screening below. Denies irritability, anhedonia, sadness/tearfullness.  Stable.   Falls: See screening below.    Medication: Taking as directed and without issues.   Covid-19: Precautions and sickness symptoms discussed. Wears mask, social distancing, hand hygiene as appropriate.   Activities of Daily Living Patient denies needing assistance with: household chores, feeding themselves, getting from bed to chair, getting to the toilet, bathing/showering, dressing, managing money, or preparing meals.   Memory: Patient is alert. Patient denies difficulty focusing or concentrating. Correctly identified the president of the [place], season and recall. Patient likes to meditate for brain stimulation.  BMI- discussed the importance of a healthy diet, water intake and the benefits of aerobic exercise.  Educational material provided.  Physical activity- walking as tolerated  Diet: Healthy/ low cholesterol Water: he plans to improve Caffeine: 1 pot of coffee daily  Other Providers Patient Care Team: McLean-Scocuzza, Nino Glow, MD as PCP - General (Internal Medicine) Delma Post, MD as Referring Physician (Infectious Diseases)  Hearing/Vision screen  Hearing Screening   _0  _1  _2  _3  _4  _5  _6  _7  _8   Right ear:           Left ear:           Comments: Patient is able to hear conversational tones without difficulty.  No issues reported.  Vision Screening Comments: Wears corrective lenses Visual acuity not assessed, virtual visit.  They have seen their  ophthalmologist in the last 12 months.     Dietary issues and exercise activities discussed:    Goals      Patient Stated   . DIET - INCREASE WATER INTAKE (pt-stated)     I plan to drink more water      Depression Screen PHQ 2/9 Scores 12/10/2018 01/19/2018 09/29/2016 09/22/2016  PHQ - 2 Score 0 0 0 0    Fall Risk Fall Risk  12/10/2018 05/17/2018 01/19/2018 09/29/2016 09/22/2016  Falls in the past year? 0 0 0 No No   Timed Get Up and Go performed: no, virtual visit  Cognitive Function:     6CIT Screen 12/10/2018  What Year? 0 points  What month? 0 points  What time? 0 points  Count back from 20 0 points  Months in reverse 4 points  Repeat phrase 0 points  Total Score 4    Screening Tests Health Maintenance  Topic Date Due  . INFLUENZA VACCINE  06/08/2019 (Originally 10/09/2018)  . COLONOSCOPY  12/01/2019  . TETANUS/TDAP  12/19/2025  . HIV Screening  Completed  . Hepatitis C Screening  Addressed      Plan:   Keep all routine maintenance appointments.   [facility]  12/31/18 @ 230  Medicare Attestation I have personally reviewed: The patient's medical and social history Their use of alcohol, tobacco or illicit drugs Their current medications and supplements The patient's functional ability including ADLs,fall risks, home safety risks, cognitive, and hearing and visual impairment Diet and physical activities Evidence for depression  In addition, I have reviewed and discussed with patient certain preventive protocols, quality metrics, and best practice recommendations. A written personalized care plan for preventive services as well as general preventive health recommendations were provided to patient via mail.     Varney Biles, LPN   08/09/3760

## 2018-12-29 ENCOUNTER — Other Ambulatory Visit: Payer: Self-pay

## 2018-12-31 ENCOUNTER — Encounter: Payer: Medicare Other | Admitting: Internal Medicine

## 2019-01-04 ENCOUNTER — Ambulatory Visit (INDEPENDENT_AMBULATORY_CARE_PROVIDER_SITE_OTHER): Payer: Medicare Other | Admitting: Internal Medicine

## 2019-01-04 ENCOUNTER — Other Ambulatory Visit: Payer: Self-pay

## 2019-01-04 ENCOUNTER — Encounter: Payer: Self-pay | Admitting: Internal Medicine

## 2019-01-04 VITALS — BP 120/78 | HR 63 | Temp 97.7°F | Ht 71.0 in | Wt 147.4 lb

## 2019-01-04 DIAGNOSIS — L309 Dermatitis, unspecified: Secondary | ICD-10-CM | POA: Diagnosis not present

## 2019-01-04 DIAGNOSIS — K921 Melena: Secondary | ICD-10-CM

## 2019-01-04 DIAGNOSIS — Z23 Encounter for immunization: Secondary | ICD-10-CM

## 2019-01-04 DIAGNOSIS — J309 Allergic rhinitis, unspecified: Secondary | ICD-10-CM | POA: Diagnosis not present

## 2019-01-04 DIAGNOSIS — E559 Vitamin D deficiency, unspecified: Secondary | ICD-10-CM

## 2019-01-04 DIAGNOSIS — I10 Essential (primary) hypertension: Secondary | ICD-10-CM | POA: Diagnosis not present

## 2019-01-04 DIAGNOSIS — B20 Human immunodeficiency virus [HIV] disease: Secondary | ICD-10-CM | POA: Diagnosis not present

## 2019-01-04 DIAGNOSIS — E785 Hyperlipidemia, unspecified: Secondary | ICD-10-CM

## 2019-01-04 DIAGNOSIS — Z0000 Encounter for general adult medical examination without abnormal findings: Secondary | ICD-10-CM

## 2019-01-04 LAB — HEMOCCULT GUIAC POC 1CARD (OFFICE): Fecal Occult Blood, POC: NEGATIVE

## 2019-01-04 MED ORDER — DESONIDE 0.05 % EX CREA: 1.0000 "application " | TOPICAL_CREAM | Freq: Two times a day (BID) | CUTANEOUS | 3 refills | Status: DC

## 2019-01-04 MED ORDER — LORATADINE 10 MG PO TABS: 10.0000 mg | ORAL_TABLET | Freq: Every day | ORAL | 3 refills | Status: DC | PRN

## 2019-01-04 MED ORDER — CVS CLOTRIMAZOLE 1 % EX CREA: 1.0000 "application " | TOPICAL_CREAM | Freq: Two times a day (BID) | CUTANEOUS | 4 refills | Status: DC

## 2019-01-04 NOTE — Progress Notes (Signed)
Pre visit review using our clinic review tool, if applicable. No additional management support is needed unless otherwise documented below in the visit note. 

## 2019-01-04 NOTE — Patient Instructions (Signed)
Results for INDERPREET, POMRENKE (MRN [id number]) as of 01/04/2019 14:31  Ref. Range 03/15/2018 09:01  Total CHOL/HDL Ratio Unknown 3  Cholesterol Latest Ref Range: 0 - 200 mg/dL 157  HDL Cholesterol Latest Ref Range: >39.00 mg/dL 45.40  LDL (calc) Latest Ref Range: 0 - 99 mg/dL 92  NonHDL Unknown 112.06  Triglycerides Latest Ref Range: 0.0 - 149.0 mg/dL 101.0  VLDL Latest Ref Range: 0.0 - 40.0 mg/dL 20.2
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
## 2019-01-05 ENCOUNTER — Telehealth: Payer: Self-pay | Admitting: Internal Medicine

## 2019-01-05 LAB — LIPID PANEL
Cholesterol: 199 mg/dL (ref 0–200)
HDL: 57.6 mg/dL (ref >39.00)
LDL Cholesterol: 116 mg/dL — ABNORMAL HIGH (ref 0–99)
NonHDL: 141.09
Total CHOL/HDL Ratio: 3
Triglycerides: 126 mg/dL (ref 0.0–149.0)
VLDL: 25.2 mg/dL (ref 0.0–40.0)

## 2019-01-05 LAB — CBC WITH DIFFERENTIAL/PLATELET
Basophils Absolute: 0 10*3/uL (ref 0.0–0.1)
Basophils Relative: 1.1 % (ref 0.0–3.0)
Eosinophils Absolute: 0.2 10*3/uL (ref 0.0–0.7)
Eosinophils Relative: 6.3 % — ABNORMAL HIGH (ref 0.0–5.0)
HCT: 47.1 % (ref 39.0–52.0)
Hemoglobin: 15.6 g/dL (ref 13.0–17.0)
Lymphocytes Relative: 55.3 % — ABNORMAL HIGH (ref 12.0–46.0)
Lymphs Abs: 2.1 10*3/uL (ref 0.7–4.0)
MCHC: 33.2 g/dL (ref 30.0–36.0)
MCV: 98.4 fl (ref 78.0–100.0)
Monocytes Absolute: 0.4 10*3/uL (ref 0.1–1.0)
Monocytes Relative: 10.1 % (ref 3.0–12.0)
Neutro Abs: 1 10*3/uL — ABNORMAL LOW (ref 1.4–7.7)
Neutrophils Relative %: 27.2 % — ABNORMAL LOW (ref 43.0–77.0)
Platelets: 198 10*3/uL (ref 150.0–400.0)
RBC: 4.79 Mil/uL (ref 4.22–5.81)
RDW: 14.6 % (ref 11.5–15.5)
WBC: 3.8 10*3/uL — ABNORMAL LOW (ref 4.0–10.5)

## 2019-01-05 LAB — COMPREHENSIVE METABOLIC PANEL
ALT: 23 U/L (ref 0–53)
AST: 28 U/L (ref 0–37)
Albumin: 4.4 g/dL (ref 3.5–5.2)
Alkaline Phosphatase: 92 U/L (ref 39–117)
BUN: 7 mg/dL (ref 6–23)
CO2: 30 mEq/L (ref 19–32)
Calcium: 9.7 mg/dL (ref 8.4–10.5)
Chloride: 103 mEq/L (ref 96–112)
Creatinine, Ser: 1.09 mg/dL (ref 0.40–1.50)
GFR: 83.39 mL/min (ref >60.00)
Glucose, Bld: 95 mg/dL (ref 70–99)
Potassium: 4.8 mEq/L (ref 3.5–5.1)
Sodium: 140 mEq/L (ref 135–145)
Total Bilirubin: 0.6 mg/dL (ref 0.2–1.2)
Total Protein: 7.1 g/dL (ref 6.0–8.3)

## 2019-01-05 MED ORDER — HYDROCORTISONE 2.5 % EX CREA: TOPICAL_CREAM | Freq: Two times a day (BID) | CUTANEOUS | 11 refills | Status: DC

## 2019-01-05 MED ORDER — VITAMIN D3 SUPER STRENGTH 50 MCG (2000 UT) PO TABS: 2.0000 | ORAL_TABLET | Freq: Every day | ORAL | 3 refills | Status: AC

## 2019-01-05 NOTE — Addendum Note (Signed)
Addended by: Orland Mustard on: 01/05/2019 06:18 PM   Modules accepted: Level of Service

## 2019-01-05 NOTE — Addendum Note (Signed)
Addended by: Orland Mustard on: 01/05/2019 10:39 AM   Modules accepted: Orders

## 2019-01-05 NOTE — Telephone Encounter (Signed)
He is unable to donate organs due to history of infection   [place]

## 2019-01-05 NOTE — Telephone Encounter (Signed)
Patient requesting call back from [place] to discuss further.

## 2019-01-05 NOTE — Telephone Encounter (Signed)
I called pt and left a vm to call ofc to schedule Return for 4-6 months.

## 2019-01-05 NOTE — Telephone Encounter (Signed)
Noted  TMS 

## 2019-01-05 NOTE — Telephone Encounter (Signed)
Patient would like to let Dr Olivia Mackie know his dentist is  [facility] [place] , [place], [place]

## 2019-01-05 NOTE — Telephone Encounter (Signed)
Patient was informed.  Patient understood and no questions, comments, or concerns at this time.  

## 2019-01-12 LAB — HIV-1 RNA ULTRAQUANT REFLEX TO GENTYP+
HIV 1 RNA Quant: <20 Copies/mL
HIV-1 RNA Quant, Log: <1.3 Log cps/mL

## 2019-01-12 LAB — T-HELPER CELLS (CD4) COUNT (NOT AT ARMC)
Absolute CD4: 1121 cells/uL (ref 490–1740)
CD4 T Helper %: 40 % (ref 30–61)
Total lymphocyte count: 2775 cells/uL (ref 850–3900)

## 2019-04-14 ENCOUNTER — Telehealth: Payer: Self-pay | Admitting: Internal Medicine

## 2019-04-14 NOTE — Telephone Encounter (Signed)
Pt wants to know if he should get covid vaccine. Please advise.

## 2019-04-14 NOTE — Telephone Encounter (Signed)
Please advise 

## 2019-04-14 NOTE — Telephone Encounter (Signed)
Would rec he ask ID doctor  Thanks [place]

## 2019-04-18 NOTE — Telephone Encounter (Signed)
Patient was informed.  Patient understood and no questions, comments, or concerns at this time.  

## 2019-05-03 ENCOUNTER — Telehealth: Payer: Self-pay | Admitting: Internal Medicine

## 2019-05-03 NOTE — Telephone Encounter (Signed)
I have him on claritin/loratidine does he want zyrtec instead?

## 2019-05-03 NOTE — Telephone Encounter (Signed)
Okay to send in? Did not see on patient's current or past med list.

## 2019-05-03 NOTE — Telephone Encounter (Signed)
Pt called and needs a refill on Cetirizine 10 mg Tablets sent to [facility]

## 2019-05-04 NOTE — Telephone Encounter (Signed)
Left message to return call 

## 2019-06-09 DIAGNOSIS — Z87891 Personal history of nicotine dependence: Secondary | ICD-10-CM | POA: Diagnosis not present

## 2019-06-09 DIAGNOSIS — Z79899 Other long term (current) drug therapy: Secondary | ICD-10-CM | POA: Diagnosis not present

## 2019-06-09 DIAGNOSIS — Z9189 Other specified personal risk factors, not elsewhere classified: Secondary | ICD-10-CM

## 2019-06-09 DIAGNOSIS — Z23 Encounter for immunization: Secondary | ICD-10-CM | POA: Diagnosis not present

## 2019-06-09 DIAGNOSIS — I10 Essential (primary) hypertension: Secondary | ICD-10-CM | POA: Diagnosis not present

## 2019-06-09 DIAGNOSIS — Z862 Personal history of diseases of the blood and blood-forming organs and certain disorders involving the immune mechanism: Secondary | ICD-10-CM | POA: Diagnosis not present

## 2019-06-09 HISTORY — DX: Other specified personal risk factors, not elsewhere classified: Z91.89

## 2019-07-12 ENCOUNTER — Other Ambulatory Visit: Payer: Self-pay | Admitting: Internal Medicine

## 2019-07-12 DIAGNOSIS — I252 Old myocardial infarction: Secondary | ICD-10-CM

## 2019-07-12 DIAGNOSIS — G2581 Restless legs syndrome: Secondary | ICD-10-CM

## 2019-07-12 DIAGNOSIS — E782 Mixed hyperlipidemia: Secondary | ICD-10-CM

## 2019-07-12 DIAGNOSIS — G4700 Insomnia, unspecified: Secondary | ICD-10-CM

## 2019-07-12 DIAGNOSIS — G8929 Other chronic pain: Secondary | ICD-10-CM

## 2019-07-12 DIAGNOSIS — I10 Essential (primary) hypertension: Secondary | ICD-10-CM

## 2019-07-12 MED ORDER — ROPINIROLE HCL 1 MG PO TABS: 1.0000 mg | ORAL_TABLET | Freq: Every day | ORAL | 3 refills | Status: DC

## 2019-07-12 MED ORDER — DULOXETINE HCL 60 MG PO CPEP: 60.0000 mg | ORAL_CAPSULE | Freq: Every day | ORAL | 3 refills | Status: DC

## 2019-07-12 MED ORDER — TRAZODONE HCL 50 MG PO TABS: 50.0000 mg | ORAL_TABLET | Freq: Every evening | ORAL | 3 refills | Status: DC | PRN

## 2019-07-12 MED ORDER — AMLODIPINE BESYLATE 2.5 MG PO TABS: 2.5000 mg | ORAL_TABLET | Freq: Every day | ORAL | 3 refills | Status: DC

## 2019-07-12 MED ORDER — PRAVASTATIN SODIUM 40 MG PO TABS: 40.0000 mg | ORAL_TABLET | Freq: Every day | ORAL | 3 refills | Status: DC

## 2019-07-22 ENCOUNTER — Ambulatory Visit (INDEPENDENT_AMBULATORY_CARE_PROVIDER_SITE_OTHER): Payer: Medicare Other

## 2019-07-22 ENCOUNTER — Encounter: Payer: Self-pay | Admitting: Internal Medicine

## 2019-07-22 ENCOUNTER — Other Ambulatory Visit: Payer: Self-pay

## 2019-07-22 ENCOUNTER — Ambulatory Visit (INDEPENDENT_AMBULATORY_CARE_PROVIDER_SITE_OTHER): Payer: Medicare Other | Admitting: Internal Medicine

## 2019-07-22 VITALS — BP 122/80 | HR 72 | Temp 97.1°F | Ht 69.29 in | Wt 144.8 lb

## 2019-07-22 DIAGNOSIS — R0981 Nasal congestion: Secondary | ICD-10-CM

## 2019-07-22 DIAGNOSIS — R200 Anesthesia of skin: Secondary | ICD-10-CM

## 2019-07-22 DIAGNOSIS — Z1389 Encounter for screening for other disorder: Secondary | ICD-10-CM | POA: Diagnosis not present

## 2019-07-22 DIAGNOSIS — M47812 Spondylosis without myelopathy or radiculopathy, cervical region: Secondary | ICD-10-CM | POA: Diagnosis not present

## 2019-07-22 DIAGNOSIS — R202 Paresthesia of skin: Secondary | ICD-10-CM

## 2019-07-22 DIAGNOSIS — L74512 Primary focal hyperhidrosis, palms: Secondary | ICD-10-CM | POA: Insufficient documentation

## 2019-07-22 DIAGNOSIS — Z125 Encounter for screening for malignant neoplasm of prostate: Secondary | ICD-10-CM

## 2019-07-22 DIAGNOSIS — Z0000 Encounter for general adult medical examination without abnormal findings: Secondary | ICD-10-CM | POA: Diagnosis not present

## 2019-07-22 DIAGNOSIS — I6523 Occlusion and stenosis of bilateral carotid arteries: Secondary | ICD-10-CM | POA: Diagnosis not present

## 2019-07-22 DIAGNOSIS — J4530 Mild persistent asthma, uncomplicated: Secondary | ICD-10-CM

## 2019-07-22 DIAGNOSIS — Z1329 Encounter for screening for other suspected endocrine disorder: Secondary | ICD-10-CM | POA: Diagnosis not present

## 2019-07-22 DIAGNOSIS — G5601 Carpal tunnel syndrome, right upper limb: Secondary | ICD-10-CM

## 2019-07-22 HISTORY — DX: Primary focal hyperhidrosis, palms: L74.512

## 2019-07-22 MED ORDER — FLUTICASONE PROPIONATE 50 MCG/ACT NA SUSP: 2.0000 | Freq: Every day | NASAL | 12 refills | Status: DC | PRN

## 2019-07-22 MED ORDER — BREO ELLIPTA 100-25 MCG/INH IN AEPB: 1.0000 | INHALATION_SPRAY | Freq: Every day | RESPIRATORY_TRACT | 1 refills | Status: DC

## 2019-07-22 MED ORDER — ALUMINUM CHLORIDE 20 % EX SOLN: Freq: Every day | CUTANEOUS | 11 refills | Status: DC

## 2019-07-22 MED ORDER — SALINE SPRAY 0.65 % NA SOLN: 2.0000 | Freq: Every day | NASAL | 12 refills | Status: DC | PRN

## 2019-07-22 MED ORDER — ALBUTEROL SULFATE HFA 108 (90 BASE) MCG/ACT IN AERS: 1.0000 | INHALATION_SPRAY | Freq: Four times a day (QID) | RESPIRATORY_TRACT | 4 refills | Status: DC | PRN
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
## 2019-07-23 LAB — URINALYSIS, ROUTINE W REFLEX MICROSCOPIC
Bacteria, UA: NONE SEEN /HPF
Bilirubin Urine: NEGATIVE
Glucose, UA: NEGATIVE
Hgb urine dipstick: NEGATIVE
Hyaline Cast: NONE SEEN /LPF
Ketones, ur: NEGATIVE
Nitrite: NEGATIVE
Protein, ur: NEGATIVE
Specific Gravity, Urine: 1.021 (ref 1.001–1.03)
Squamous Epithelial / HPF: NONE SEEN /HPF (ref <=5)
pH: 5.5 (ref 5.0–8.0)

## 2019-07-23 LAB — PSA: PSA: 1 ng/mL (ref <=4.0)

## 2019-07-23 LAB — TSH: TSH: 0.99 mIU/L (ref 0.40–4.50)

## 2019-08-15 NOTE — Addendum Note (Signed)
Addended by: Orland Mustard on: 08/15/2019 11:10 PM   Modules accepted: Orders

## 2019-08-31 DIAGNOSIS — R200 Anesthesia of skin: Secondary | ICD-10-CM | POA: Diagnosis not present

## 2019-08-31 DIAGNOSIS — R202 Paresthesia of skin: Secondary | ICD-10-CM | POA: Diagnosis not present

## 2019-08-31 DIAGNOSIS — R29898 Other symptoms and signs involving the musculoskeletal system: Secondary | ICD-10-CM | POA: Diagnosis not present

## 2019-09-01 ENCOUNTER — Other Ambulatory Visit (HOSPITAL_COMMUNITY): Payer: Self-pay | Admitting: Neurology

## 2019-09-01 ENCOUNTER — Other Ambulatory Visit: Payer: Self-pay | Admitting: Neurology

## 2019-09-01 DIAGNOSIS — M5412 Radiculopathy, cervical region: Secondary | ICD-10-CM

## 2019-09-04 ENCOUNTER — Other Ambulatory Visit: Payer: Self-pay

## 2019-09-04 ENCOUNTER — Ambulatory Visit
Admission: RE | Admit: 2019-09-04 | Discharge: 2019-09-04 | Disposition: A | Discharge status: Home / Self Care | Payer: Medicare Other | Source: Ambulatory Visit | Attending: Neurology | Admitting: Neurology

## 2019-09-04 DIAGNOSIS — M5412 Radiculopathy, cervical region: Secondary | ICD-10-CM | POA: Diagnosis not present

## 2019-09-04 DIAGNOSIS — M542 Cervicalgia: Secondary | ICD-10-CM | POA: Diagnosis not present

## 2019-09-07 DIAGNOSIS — M5412 Radiculopathy, cervical region: Secondary | ICD-10-CM | POA: Diagnosis not present

## 2019-09-07 DIAGNOSIS — M4802 Spinal stenosis, cervical region: Secondary | ICD-10-CM | POA: Diagnosis not present

## 2019-09-07 DIAGNOSIS — M542 Cervicalgia: Secondary | ICD-10-CM | POA: Diagnosis not present

## 2019-09-09 DIAGNOSIS — G608 Other hereditary and idiopathic neuropathies: Secondary | ICD-10-CM | POA: Insufficient documentation

## 2019-09-09 DIAGNOSIS — R29898 Other symptoms and signs involving the musculoskeletal system: Secondary | ICD-10-CM | POA: Insufficient documentation

## 2019-09-16 DIAGNOSIS — M542 Cervicalgia: Secondary | ICD-10-CM | POA: Diagnosis not present

## 2019-09-16 DIAGNOSIS — M4802 Spinal stenosis, cervical region: Secondary | ICD-10-CM | POA: Diagnosis not present

## 2019-09-16 DIAGNOSIS — M5412 Radiculopathy, cervical region: Secondary | ICD-10-CM | POA: Diagnosis not present

## 2019-09-30 DIAGNOSIS — M5412 Radiculopathy, cervical region: Secondary | ICD-10-CM | POA: Diagnosis not present

## 2019-09-30 DIAGNOSIS — M542 Cervicalgia: Secondary | ICD-10-CM

## 2019-09-30 DIAGNOSIS — M4802 Spinal stenosis, cervical region: Secondary | ICD-10-CM | POA: Diagnosis not present

## 2019-09-30 DIAGNOSIS — M87051 Idiopathic aseptic necrosis of right femur: Secondary | ICD-10-CM | POA: Diagnosis not present

## 2019-09-30 DIAGNOSIS — I700 Atherosclerosis of aorta: Secondary | ICD-10-CM | POA: Diagnosis not present

## 2019-09-30 HISTORY — DX: Cervicalgia: M54.2

## 2019-09-30 HISTORY — DX: Spinal stenosis, cervical region: M48.02

## 2019-10-06 DIAGNOSIS — M4802 Spinal stenosis, cervical region: Secondary | ICD-10-CM | POA: Diagnosis not present

## 2019-10-06 DIAGNOSIS — M5412 Radiculopathy, cervical region: Secondary | ICD-10-CM | POA: Diagnosis not present

## 2019-10-06 DIAGNOSIS — M542 Cervicalgia: Secondary | ICD-10-CM | POA: Diagnosis not present

## 2019-10-18 ENCOUNTER — Other Ambulatory Visit: Payer: Self-pay

## 2019-10-18 ENCOUNTER — Ambulatory Visit
Admission: RE | Admit: 2019-10-18 | Discharge: 2019-10-18 | Disposition: A | Discharge status: Home / Self Care | Payer: Medicare Other | Source: Ambulatory Visit | Attending: Internal Medicine | Admitting: Internal Medicine

## 2019-10-18 DIAGNOSIS — I6523 Occlusion and stenosis of bilateral carotid arteries: Secondary | ICD-10-CM

## 2019-10-26 DIAGNOSIS — R200 Anesthesia of skin: Secondary | ICD-10-CM | POA: Diagnosis not present

## 2019-10-26 DIAGNOSIS — R202 Paresthesia of skin: Secondary | ICD-10-CM | POA: Diagnosis not present

## 2019-10-26 DIAGNOSIS — R29898 Other symptoms and signs involving the musculoskeletal system: Secondary | ICD-10-CM | POA: Diagnosis not present

## 2019-10-26 DIAGNOSIS — M5412 Radiculopathy, cervical region: Secondary | ICD-10-CM | POA: Diagnosis not present

## 2019-10-31 ENCOUNTER — Other Ambulatory Visit: Payer: Self-pay | Admitting: Orthopedic Surgery

## 2019-10-31 ENCOUNTER — Encounter: Payer: Self-pay | Admitting: Emergency Medicine

## 2019-10-31 ENCOUNTER — Emergency Department: Payer: Medicare Other

## 2019-10-31 ENCOUNTER — Emergency Department
Admission: EM | Admit: 2019-10-31 | Discharge: 2019-10-31 | Disposition: A | Discharge status: Home / Self Care | Payer: Medicare Other | Attending: Emergency Medicine | Admitting: Emergency Medicine

## 2019-10-31 ENCOUNTER — Other Ambulatory Visit: Payer: Self-pay

## 2019-10-31 DIAGNOSIS — Z79891 Long term (current) use of opiate analgesic: Secondary | ICD-10-CM | POA: Diagnosis not present

## 2019-10-31 DIAGNOSIS — Z87891 Personal history of nicotine dependence: Secondary | ICD-10-CM | POA: Diagnosis not present

## 2019-10-31 DIAGNOSIS — M87051 Idiopathic aseptic necrosis of right femur: Secondary | ICD-10-CM | POA: Insufficient documentation

## 2019-10-31 DIAGNOSIS — Z794 Long term (current) use of insulin: Secondary | ICD-10-CM | POA: Insufficient documentation

## 2019-10-31 DIAGNOSIS — I2510 Atherosclerotic heart disease of native coronary artery without angina pectoris: Secondary | ICD-10-CM | POA: Diagnosis not present

## 2019-10-31 DIAGNOSIS — M25551 Pain in right hip: Secondary | ICD-10-CM | POA: Diagnosis not present

## 2019-10-31 DIAGNOSIS — M25552 Pain in left hip: Secondary | ICD-10-CM | POA: Diagnosis not present

## 2019-10-31 DIAGNOSIS — D125 Benign neoplasm of sigmoid colon: Secondary | ICD-10-CM | POA: Diagnosis not present

## 2019-10-31 DIAGNOSIS — Z743 Need for continuous supervision: Secondary | ICD-10-CM | POA: Diagnosis not present

## 2019-10-31 DIAGNOSIS — M87851 Other osteonecrosis, right femur: Secondary | ICD-10-CM | POA: Diagnosis not present

## 2019-10-31 DIAGNOSIS — M533 Sacrococcygeal disorders, not elsewhere classified: Secondary | ICD-10-CM | POA: Diagnosis not present

## 2019-10-31 DIAGNOSIS — M87052 Idiopathic aseptic necrosis of left femur: Secondary | ICD-10-CM | POA: Diagnosis not present

## 2019-10-31 DIAGNOSIS — M87852 Other osteonecrosis, left femur: Secondary | ICD-10-CM | POA: Diagnosis not present

## 2019-10-31 DIAGNOSIS — Z79899 Other long term (current) drug therapy: Secondary | ICD-10-CM | POA: Insufficient documentation

## 2019-10-31 DIAGNOSIS — M5136 Other intervertebral disc degeneration, lumbar region: Secondary | ICD-10-CM | POA: Diagnosis not present

## 2019-10-31 DIAGNOSIS — M8700 Idiopathic aseptic necrosis of unspecified bone: Secondary | ICD-10-CM

## 2019-10-31 DIAGNOSIS — J45909 Unspecified asthma, uncomplicated: Secondary | ICD-10-CM | POA: Insufficient documentation

## 2019-10-31 DIAGNOSIS — M1612 Unilateral primary osteoarthritis, left hip: Secondary | ICD-10-CM | POA: Diagnosis not present

## 2019-10-31 DIAGNOSIS — I10 Essential (primary) hypertension: Secondary | ICD-10-CM | POA: Insufficient documentation

## 2019-10-31 DIAGNOSIS — D124 Benign neoplasm of descending colon: Secondary | ICD-10-CM | POA: Diagnosis not present

## 2019-10-31 DIAGNOSIS — M25559 Pain in unspecified hip: Secondary | ICD-10-CM | POA: Diagnosis not present

## 2019-10-31 DIAGNOSIS — R5381 Other malaise: Secondary | ICD-10-CM | POA: Diagnosis not present

## 2019-10-31 DIAGNOSIS — M5137 Other intervertebral disc degeneration, lumbosacral region: Secondary | ICD-10-CM | POA: Diagnosis not present

## 2019-10-31 MED ORDER — OXYCODONE-ACETAMINOPHEN 5-325 MG PO TABS
1.0000 | ORAL_TABLET | Freq: Once | ORAL | Status: AC
  Administered 2019-10-31: 1 via ORAL
  Filled 2019-10-31: qty 1

## 2019-10-31 MED ORDER — OXYCODONE-ACETAMINOPHEN 5-325 MG PO TABS: 1.0000 | ORAL_TABLET | Freq: Four times a day (QID) | ORAL | 0 refills | Status: DC | PRN

## 2019-10-31 NOTE — ED Triage Notes (Signed)
Pt reports spoke with Dr. Rudene Christians office who agreed to see him at 2:30 today and he told them he was over here is they wanted to come see him.

## 2019-10-31 NOTE — ED Triage Notes (Signed)
Pt reports has chronic hip pain and needs a hip replacement on both sides. Pt states that dr. Rudene Christians is aware and is supposed to be scheduling for that but today the pain got worse and the left side is difficult to walk on.

## 2019-10-31 NOTE — ED Notes (Signed)
Pt presentation discussed with Roderic Palau, [place], pt unable to ambulate or bear weight and requests to be transported home via EMS due to not having someone to come and get him

## 2019-10-31 NOTE — ED Notes (Signed)
EMS arrived to transport pt. Per EMT pt ambulated to stretcher when he was picked up earlier therefore they recommend him finding a ride home due to possibility of receiving a bill. This RN exited another pt room to find EMTs gone and pt sitting on the side of the bed

## 2019-10-31 NOTE — ED Provider Notes (Signed)
[facility] Emergency Department Provider Note  ____________________________________________  Time seen: Approximately 3:37 PM  I have reviewed the triage vital signs and the nursing notes.   HISTORY  Chief Complaint Hip Pain    HPI Samuel Burnett is a 59 y.o. male who presents the emergency department complaining of bilateral hip pain, worse on the left than right.  Patient states that he has a history of chronic low back and bilateral hip pain.  He has been followed by orthopedics.  He states that he has "decreased blood supply" to the bones of the bilateral hips.  He states that he has been in discussion with his orthopedic surgeon about bilateral hip replacements.  Patient states that he was walking yesterday, felt a sudden sharp pain primarily starting in the left hip.  There was no direct trauma.  No falls.  Patient has had no numbness and tingling or pain radiating from the hip.  Patient states that it is now in bilateral hips.  He states that he has had significant pain in the past from his back but he feels like this is originating from his hip.  He was still ambulatory after the pain but the pain continues to increase.          Past Medical History:  Diagnosis Date  . Allergy   . Asthma   . Chronic low back pain   . Colon polyps    tubular and hyperplastic Dr. Allen Norris   . Controlled insomnia   . Eczema   . Emphysema of lung ([place]) 09/22/2016  . History of chicken pox   . HIV (human immunodeficiency virus infection) ([place])   . HIV infection ([place])   . Hyperlipidemia   . Hypertension   . Numbness in right leg    outside of right foot, related to medical device implant in spine  . Osteoarthritis of lumbar spine    also- hips  . Other forms of scoliosis, thoracolumbar region   . Restless leg syndrome   . Syphilis   . Syphilis     Patient Active Problem List   Diagnosis Date Noted  . Cervical arthritis 07/22/2019  . Hyperhidrosis of hands  07/22/2019  . 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 Lumbar spondylosis 06/16/2018  . DDD (degenerative disc disease), lumbosacral 06/16/2018  . Lumbar facet arthropathy (Bilateral) 06/16/2018  . Lumbar facet syndrome (Bilateral) 06/16/2018  . 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/16/2018  . Chronic musculoskeletal pain 06/16/2018  . Osteoarthritis involving multiple joints 06/16/2018  . Cocaine use 05/24/2018  . Marijuana use 05/24/2018  . Chronic low back pain [facility] Area of Pain) (Bilateral) (R>L) w/ sciatica  (Bilateral) 05/17/2018  . History of tobacco use 05/17/2018  . Asymptomatic HIV infection ([place]) 05/17/2018  . Chronic hip pain (Primary Area of Pain) (Bilateral) (R>L) 05/17/2018  . Chronic lower extremity pain (Secondary Area of Pain) (Bilateral) (R>L) 05/17/2018  . Chronic pain syndrome 05/17/2018  . Long term current use of opiate analgesic 05/17/2018  . Annual physical exam 05/17/2018  . Disorder of skeletal system 05/17/2018  . Problems influencing health status 05/17/2018  . Insomnia 01/19/2018  . Essential hypertension 01/19/2018  . H/O syncope 11/16/2017  . History of neutropenia 11/16/2017  . Need for vaccination 04/02/2017  . Encounter for long-term (current) use of medications 12/11/2016  . Benign neoplasm of descending colon 09/25/2016  . Abnormal CT of the chest 09/22/2016  . Ground glass opacity present on imaging of lung 09/22/2016  .  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 HIV (human immunodeficiency virus infection) ([place]) 09/22/2016  . Aortic atherosclerosis ([place]) 04/02/2016  . CAD (coronary artery disease) 04/02/2016  . Personal history of tobacco use, presenting hazards to health 01/20/2016  . Neutropenia ([place]) 01/16/2016  . Positive RPR test 12/21/2015  . Long term systemic steroid user 12/20/2015  . Hx of myocardial infarction 12/20/2015  . Prostate cancer screening 12/20/2015  . Preventative health care 12/20/2015  . Rosacea  11/21/2015  . Encounter for medication monitoring 11/19/2015  . Abnormal weight loss 11/19/2015  . Type O blood, Rh negative 04/24/2015  . Hx of colonic polyps   . Benign neoplasm of sigmoid colon   . Screening examination for sexually transmitted disease 10/05/2014  . Allergic rhinitis 09/12/2014  . Anxiety and depression 09/12/2014  . ED (erectile dysfunction) of organic origin 09/12/2014  . HLD (hyperlipidemia) 09/12/2014  . Restless leg 09/12/2014  . Vitamin D deficiency 09/12/2014  . Scoliosis 09/12/2014  . AD (atopic dermatitis) 04/06/2014  . History of concussion 04/19/2013  . Chronic radicular lumbar pain (Right) 09/09/2012  . Spondylosis of lumbar region without myelopathy or radiculopathy 09/09/2012  . Discogenic low back pain 09/09/2012  . Legal circumstances 09/09/2012    Past Surgical History:  Procedure Laterality Date  . COLONOSCOPY WITH PROPOFOL N/A 12/01/2014   Procedure: COLONOSCOPY WITH PROPOFOL;  Surgeon: [name], MD;  Location: [place];  Service: Endoscopy;  Laterality: N/A;  . EYE SURGERY  age 60   uncross eyes  . POLYPECTOMY  12/01/2014   Procedure: POLYPECTOMY;  Surgeon: [name], MD;  Location: [place];  Service: Endoscopy;;  . SPINAL CORD STIMULATOR IMPLANT  01/19/2012   Dr. Mariea Stable model # [id number] serial # [id number] Medtronic   . SPINE SURGERY  01/21/12   implanted medtronics SCS    Prior to Admission medications   Medication Sig Start Date End Date Taking? Authorizing Provider  albuterol (PROAIR HFA) 108 (90 Base) MCG/ACT inhaler Inhale 1-2 puffs into the lungs every 6 (six) hours as needed for wheezing or shortness of breath. 07/22/19   McLean-Scocuzza, Nino Glow, MD  aluminum chloride (DRYSOL) 20 % external solution Apply topically at bedtime. 07/22/19   McLean-Scocuzza, Nino Glow, MD  amLODipine (NORVASC) 2.5 MG tablet Take 1 tablet (2.5 mg total) by mouth daily. 07/12/19   McLean-Scocuzza, Nino Glow, MD   bictegravir-emtricitabine-tenofovir AF (BIKTARVY) 50-200-25 MG TABS tablet Take 1 tablet by mouth daily.    [provider]  Blood Pressure Monitor KIT 1 kit by Does not apply route as directed. 07/03/16   Wende Bushy, MD  CALCIUM PO Take by mouth.    [provider]  cetirizine (ZYRTEC) 10 MG tablet Take 10 mg by mouth daily.    [provider]  Cholecalciferol (VITAMIN D3 SUPER STRENGTH) 50 MCG (2000 UT) TABS Take 2 tablets (4,000 Units total) by mouth daily. 01/05/19   McLean-Scocuzza, Nino Glow, MD  [employer] CLOTRIMAZOLE 1 % cream Apply 1 application topically 2 (two) times daily. 01/04/19   McLean-Scocuzza, Nino Glow, MD  DULoxetine (CYMBALTA) 60 MG capsule Take 1 capsule (60 mg total) by mouth daily. 07/12/19   McLean-Scocuzza, Nino Glow, MD  fluticasone (FLONASE) 50 MCG/ACT nasal spray Place 2 sprays into both nostrils daily as needed for allergies or rhinitis. Use nasal saline 1st 07/22/19   McLean-Scocuzza, Nino Glow, MD  fluticasone furoate-vilanterol (BREO ELLIPTA) 100-25 MCG/INH AEPB Inhale 1 puff into the lungs daily. Rinse mouth 07/22/19   McLean-Scocuzza, Olivia Mackie  N, MD  hydrocortisone 2.5 % cream Apply topically 2 (two) times daily. face 01/05/19   McLean-Scocuzza, Nino Glow, MD  INSULIN SYRINGE 1CC/29G 29G X 1/2" 1 ML MISC 1 each daily by Other route. 01/10/17   [provider]  Multiple Vitamins-Minerals (ZINC PO) Take by mouth.    [provider]  mupirocin ointment (BACTROBAN) 2 % Apply 1 application topically 2 (two) times daily. X 5 days with qtip Patient not taking: Reported on 07/22/2019 04/09/18   McLean-Scocuzza, Nino Glow, MD  NON FORMULARY Prostaglandin injection 12 mg-16m-9mcg/mL injection    [provider]  oxyCODONE-acetaminophen (PERCOCET/ROXICET) 5-325 MG tablet Take 1 tablet by mouth every 6 (six) hours as needed for severe pain. 10/31/19   Cuthriell, JCharline Bills PA-C  pravastatin (PRAVACHOL) 40 MG tablet Take 1 tablet (40 mg total) by  mouth daily. At night 07/12/19   McLean-Scocuzza, TNino Glow MD  rOPINIRole (REQUIP) 1 MG tablet Take 1 tablet (1 mg total) by mouth at bedtime. 07/12/19   McLean-Scocuzza, TNino Glow MD  sodium chloride (OCEAN) 0.65 % SOLN nasal spray Place 2 sprays into both nostrils daily as needed for congestion. 07/22/19   McLean-Scocuzza, TNino Glow MD  traZODone (DESYREL) 50 MG tablet Take 1 tablet (50 mg total) by mouth at bedtime as needed for sleep. 07/12/19   McLean-Scocuzza, TNino Glow MD  Zinc 100 MG TABS Take by mouth.    [provider]    Allergies Fentanyl, Shellfish allergy, Tomato, Bee pollen, Penicillin g, Pollen extract, Aspirin, and Dust mite extract  Family History  Problem Relation Age of Onset  . Lupus Mother   . Alcohol abuse Mother   . Arthritis Mother   . Depression Mother   . Heart disease Mother   . Hyperlipidemia Mother   . Hypertension Mother   . Cancer Father        P[place]reatis  . Depression Father   . Alcohol abuse Father   . Heart disease Father   . Hyperlipidemia Father   . Hypertension Father   . Mental illness Father   . Diabetes Sister   . Arthritis Brother   . COPD Brother   . Depression Brother   . Heart disease Brother   . Hyperlipidemia Brother   . Hypertension Brother   . Hyperlipidemia Son   . Hypertension Son   . Diabetes Sister   . Hypertension Sister   . Stroke Sister   . Alcohol abuse Sister   . Anxiety disorder Sister   . COPD Sister   . Depression Sister   . Heart disease Sister   . Hyperlipidemia Sister   . Diabetes Brother   . Arthritis Brother   . Heart disease Brother   . Hyperlipidemia Brother   . Hypertension Brother   . Mental illness Brother     Social History Social History   Tobacco Use  . Smoking status: Former Smoker    Packs/day: 1.00    Years: 30.00    Pack years: 30.00    Types: Cigarettes    Start date: 09/11/1984    Quit date: 11/08/2015    Years since quitting: 3.9  . Smokeless tobacco: Never Used  Vaping Use   . Vaping Use: Never used  Substance Use Topics  . Alcohol use: Yes    Alcohol/week: 0.0 standard drinks    Comment: may drink on "special occasions"  . Drug use: No     Review of Systems  Constitutional: No fever/chills Eyes: No visual changes.  No discharge ENT: No upper respiratory complaints. Cardiovascular: no chest pain. Respiratory: no cough. No SOB. Gastrointestinal: No abdominal pain.  No nausea, no vomiting.  No diarrhea.  No constipation. Genitourinary: Negative for dysuria. No hematuria Musculoskeletal: Positive for low back, bilateral hip pain Skin: Negative for rash, abrasions, lacerations, ecchymosis. Neurological: Negative for headaches, focal weakness or numbness. 10-point ROS otherwise negative.  ____________________________________________   PHYSICAL EXAM:  VITAL SIGNS: ED Triage Vitals  Enc Vitals Group     BP 10/31/19 1407 (!) 127/98     Pulse Rate 10/31/19 1407 69     Resp 10/31/19 1407 20     Temp 10/31/19 1407 98.7 F (37.1 C)     Temp Source 10/31/19 1407 Oral     SpO2 10/31/19 1407 96 %     Weight 10/31/19 1405 140 lb (63.5 kg)     Height 10/31/19 1405 5' 11"  (1.803 m)     Head Circumference --      Peak Flow --      Pain Score 10/31/19 1405 10     Pain Loc --      Pain Edu? --      Excl. in [place]? --      Constitutional: Alert and oriented. Well appearing and in no acute distress. Eyes: Conjunctivae are normal. PERRL. EOMI. Head: Atraumatic. ENT:      Ears:       Nose: No congestion/rhinnorhea.      Mouth/Throat: Mucous membranes are moist.  Neck: No stridor.   Cardiovascular: Normal rate, regular rhythm. Normal S1 and S2.  Good peripheral circulation. Respiratory: Normal respiratory effort without tachypnea or retractions. Lungs CTAB. Good air entry to the bases with no decreased or absent breath sounds. Gastrointestinal: Bowel sounds 4 quadrants. Soft and nontender to palpation. No guarding or rigidity. No palpable masses. No  distention. No CVA tenderness. Musculoskeletal: Full range of motion to all extremities. No gross deformities appreciated.  No deformity, shortening or rotation being lower extremities.  Patient with significant tenderness along the left iliac crest extending into the inguinal region, greater trochanter region extending into proximal femur.  No apparent tenderness over the buttocks itself.  Similar findings on the right side as well.  Limited range of motion bilaterally due to pain.  Examination of the knees, ankles and feet are unremarkable bilaterally.  Dorsalis pedis pulses sensation intact bilateral lower extremities.  Examination of the lumbar spine reveals diffuse tenderness but no point specific tenderness.  No palpable abnormality or step-off.  This extends into the SI joints bilaterally. Neurologic:  Normal speech and language. No gross focal neurologic deficits are appreciated.  Skin:  Skin is warm, dry and intact. No rash noted. Psychiatric: Mood and affect are normal. Speech and behavior are normal. Patient exhibits appropriate insight and judgement.   ____________________________________________   LABS (all labs ordered are listed, but only abnormal results are displayed)  Labs Reviewed - No data to display ____________________________________________  EKG   ____________________________________________  RADIOLOGY I personally viewed and evaluated these images as part of my medical decision making, as well as reviewing the written report by the radiologist.  DG Lumbar Spine 2-3 Views  Result Date: 10/31/2019 CLINICAL DATA:  Back and hip pain EXAM: LUMBAR SPINE - 2-3 VIEW COMPARISON:  04/22/2018 FINDINGS: There is no evidence of lumbar spine fracture. Alignment is normal without static listhesis. Mild disc height loss at L4-5 and L5-S1. Lower lumbar facet arthropathy. Partially visualized spinal stimulator leads extending into the lower thoracic  spine. IMPRESSION: 1. No acute  osseous abnormality of the lumbar spine. 2. Mild degenerative disc disease and facet arthropathy at L4-5 and L5-S1. Electronically Signed   By: Davina Poke D.O.   On: 10/31/2019 16:42   CT Hip Left Wo Contrast  Result Date: 10/31/2019 CLINICAL DATA:  Hip pain, stress fracture suspected, neg xray Bilateral AVN, sudden sharp significant pain to the left hip while walking. Negative x-ray. EXAM: CT OF THE LEFT HIP WITHOUT CONTRAST TECHNIQUE: Multidetector CT imaging of the left hip was performed according to the standard protocol. Multiplanar CT image reconstructions were also generated. COMPARISON:  Radiograph earlier today.  Radiograph 01/19/2018 FINDINGS: Bones/Joint/Cartilage Serpiginous peripherally sclerotic density in the left femoral head consistent with bone infarct/avascular necrosis. This extends to the articular surface posteriorly. There is no evidence of subchondral collapse. No acute fracture. Mild acetabular spurring with preservation of joint space. No significant joint effusion. Intact pubic rami. Included left sacroiliac joint is congruent. There is no bony destruction. No periosteal reaction or thickening. Ligaments Suboptimally assessed by CT. Muscles and Tendons Periarticular muscle bulk is maintained. No acute or focal abnormality. Soft tissues Prostatic calcifications. Left external iliac and femoral vascular calcifications. IMPRESSION: 1. Left femoral head avascular necrosis. No evidence of subchondral collapse. 2. Mild left hip osteoarthritis. Electronically Signed   By: Keith Rake M.D.   On: 10/31/2019 20:16   DG Hip Unilat W or Wo Pelvis 2-3 Views Left  Result Date: 10/31/2019 CLINICAL DATA:  Bilateral hip pain left greater than right. EXAM: DG HIP (WITH OR WITHOUT PELVIS) 2-3V LEFT COMPARISON:  01/19/2018 FINDINGS: Mild cystic and sclerotic change in the left femoral head consistent with AVN. This is unchanged from the prior study in 2019. No fracture. Joint space normal.  Right femoral head AVN also noted, more prominent than that seen on the left. No pelvic mass or fracture. Spinal cord stimulator pack overlying the right buttock. IMPRESSION: Chronic changes AVN left femoral head unchanged from 01/19/2018. No acute abnormality. Electronically Signed   By: Franchot Gallo M.D.   On: 10/31/2019 16:47   DG Hip Unilat W or Wo Pelvis 2-3 Views Right  Result Date: 10/31/2019 CLINICAL DATA:  Bilateral hip pain EXAM: DG HIP (WITH OR WITHOUT PELVIS) 2-3V RIGHT COMPARISON:  01/19/2018 FINDINGS: Mixed cystic and sclerotic changes in the right femoral head compatible with AVN unchanged. No acute fracture. Joint space normal. Milder changes of AVN in the left femoral head No pelvic fracture or mass. Spinal cord stimulator pack in the right buttock region. IMPRESSION: Bilateral AVN in the femoral head right greater than left. No acute abnormality. Electronically Signed   By: Franchot Gallo M.D.   On: 10/31/2019 16:45    ____________________________________________    PROCEDURES  Procedure(s) performed:    Procedures    Medications  oxyCODONE-acetaminophen (PERCOCET/ROXICET) 5-325 MG per tablet 1 tablet (has no administration in time range)  oxyCODONE-acetaminophen (PERCOCET/ROXICET) 5-325 MG per tablet 1 tablet (1 tablet Oral Given 10/31/19 1831)     ____________________________________________   INITIAL IMPRESSION / ASSESSMENT AND PLAN / ED COURSE  Pertinent labs & imaging results that were available during my care of the patient were reviewed by me and considered in my medical decision making (see chart for details).  Review of the  [place]RS was performed in accordance of the [place] prior to dispensing any controlled drugs.  Clinical Course as of Oct 30 2101  Mon Oct 31, 2019  1753 Patient presents with sudden sharp pain to the left  hip that appears to radiate into the back and right hip.  Patient states that he was walking yesterday when he had sudden onset of  very sharp pain.  Patient has a history of AVN.  X-ray reveals stable x-rays but given the severe nature with sudden onset I am still concerned for underlying pathologic fracture.  As such I will order CT scan for further evaluation.   [JC]    Clinical Course User Index [JC] Cuthriell, Charline Bills, PA-C          Patient's diagnosis is consistent with left hip pain, AVN.  Patient presents emergency department complaining left hip pain radiating into the right hip and back.  Patient has a history of AVN bilaterally worse on the left than right.  He has an orthopedic surgeon that is recommending total hip replacement.  Given the sudden onset of significant pain I was concerned for collapse.  Initial x-ray did not reveal any significant changes and no evidence of significant collapse.  Given the patient's reported pain, given the sudden onset further imaging with CT scan was undertaken.  Thankfully this reveals no evidence of collapse or fracture.  Patient be placed on pain medication.  He is already talked to his orthopedic surgeon and states that orthopedics is planning on doing total hip replacement to the left side in 2 weeks time..  Return precautions discussed with the patient.  Follow-up with orthopedics for further management.  Patient is given ED precautions to return to the ED for any worsening or new symptoms.     ____________________________________________  FINAL CLINICAL IMPRESSION(S) / ED DIAGNOSES  Final diagnoses:  AVN (avascular necrosis of bone) ([place])  Hip pain      NEW MEDICATIONS STARTED DURING THIS VISIT:  ED Discharge Orders         Ordered    oxyCODONE-acetaminophen (PERCOCET/ROXICET) 5-325 MG tablet  Every 6 hours PRN        10/31/19 2101              This chart was dictated using voice recognition software/Dragon. Despite best efforts to proofread, errors can occur which can change the meaning. Any change was purely unintentional.    Brynda Peon 10/31/19 2103    Nance Pear, MD 10/31/19 2121

## 2019-10-31 NOTE — ED Notes (Signed)
Pt states he now has a ride home

## 2019-10-31 NOTE — ED Notes (Signed)
See triage note. Pt to room from lobby via wheelchair, pt appears to be in pain when transferring from bed to chair. Pt skin warm and dry. Pt reports left hip pain.

## 2019-11-01 ENCOUNTER — Telehealth: Payer: Self-pay | Admitting: Internal Medicine

## 2019-11-01 DIAGNOSIS — M87052 Idiopathic aseptic necrosis of left femur: Secondary | ICD-10-CM

## 2019-11-01 NOTE — Telephone Encounter (Signed)
Pt called and states that he was in the ED for his hip. Needs hip replacement ASAP. Pt states that this is urgent and does not want it through [facility]. Pt wants to go to [facility] in [place]. Please advise

## 2019-11-02 NOTE — Telephone Encounter (Signed)
Urgent referral made to [facility].  I do not need to see him since he was imaged in ER and CT scan showed  AVN of hip

## 2019-11-02 NOTE — Telephone Encounter (Signed)
Will patient need to be seen for a referral?

## 2019-11-03 NOTE — Telephone Encounter (Signed)
Patient informed and verbalized understanding

## 2019-11-08 ENCOUNTER — Ambulatory Visit: Admit: 2019-11-08 | Payer: Medicare Other | Admitting: Orthopedic Surgery

## 2019-11-08 SURGERY — ARTHROPLASTY, HIP, TOTAL, ANTERIOR APPROACH
Anesthesia: Choice | Site: Hip | Laterality: Left

## 2019-11-11 ENCOUNTER — Telehealth: Payer: Self-pay | Admitting: Internal Medicine

## 2019-11-11 MED ORDER — OXYCODONE-ACETAMINOPHEN 5-325 MG PO TABS: 1.0000 | ORAL_TABLET | Freq: Four times a day (QID) | ORAL | 0 refills | Status: DC | PRN

## 2019-11-11 NOTE — Telephone Encounter (Signed)
Pt called and said that he is scheduled for on 9/14 [facility] @ 2:30. He needs a refill on oxycodone filled until his appointment.

## 2019-11-11 NOTE — Telephone Encounter (Signed)
ED note reviewed.  Controlled substance database reviewed.  Appropriate for refill of Percocet given avascular necrosis and need for hip replacement.  A short-term refill will be sent in and then further refills can be managed by the patient's PCP or orthopedics if they feel further refills are appropriate.

## 2019-11-16 ENCOUNTER — Telehealth: Payer: Self-pay | Admitting: Internal Medicine

## 2019-11-16 NOTE — Telephone Encounter (Signed)
Patient dropped off a handicapp form to be signed by Dr. Olivia Mackie. Patient is having hip replacement on 11/22/19.

## 2019-11-16 NOTE — Telephone Encounter (Signed)
Ok to fill out  Cant find on desk yet Thanks

## 2019-11-16 NOTE — Telephone Encounter (Signed)
Okay to fill out? Will place on your desk

## 2019-11-17 NOTE — Telephone Encounter (Signed)
Sorry, placed on your desk

## 2019-11-18 NOTE — Telephone Encounter (Signed)
Complete to be picked up

## 2019-11-18 NOTE — Telephone Encounter (Addendum)
Left message to return call. Okay to inform the Patient that form is ready when calling back.

## 2019-11-21 NOTE — Telephone Encounter (Signed)
Patient came in today to pick up form

## 2019-11-22 DIAGNOSIS — M87052 Idiopathic aseptic necrosis of left femur: Secondary | ICD-10-CM | POA: Diagnosis not present

## 2019-11-22 DIAGNOSIS — M25552 Pain in left hip: Secondary | ICD-10-CM | POA: Diagnosis not present

## 2019-11-25 ENCOUNTER — Other Ambulatory Visit: Payer: Self-pay

## 2019-11-25 ENCOUNTER — Encounter: Payer: Self-pay | Admitting: Internal Medicine

## 2019-11-25 ENCOUNTER — Telehealth (INDEPENDENT_AMBULATORY_CARE_PROVIDER_SITE_OTHER): Payer: Medicare Other | Admitting: Internal Medicine

## 2019-11-25 VITALS — Ht 68.0 in | Wt 148.0 lb

## 2019-11-25 DIAGNOSIS — J441 Chronic obstructive pulmonary disease with (acute) exacerbation: Secondary | ICD-10-CM

## 2019-11-25 DIAGNOSIS — F1990 Other psychoactive substance use, unspecified, uncomplicated: Secondary | ICD-10-CM | POA: Diagnosis not present

## 2019-11-25 DIAGNOSIS — J431 Panlobular emphysema: Secondary | ICD-10-CM | POA: Diagnosis not present

## 2019-11-25 DIAGNOSIS — R059 Cough, unspecified: Secondary | ICD-10-CM

## 2019-11-25 DIAGNOSIS — R05 Cough: Secondary | ICD-10-CM

## 2019-11-25 MED ORDER — PREDNISONE 20 MG PO TABS: 40.0000 mg | ORAL_TABLET | Freq: Every day | ORAL | 0 refills | Status: DC

## 2019-11-25 MED ORDER — AZITHROMYCIN 250 MG PO TABS: ORAL_TABLET | ORAL | 0 refills | Status: DC

## 2019-11-25 NOTE — Progress Notes (Signed)
Patient presenting with cough, onset of two weeks. Productive cough with grey phlegm.   No one around the Patient is sick. Has history of allergies and asthma.

## 2019-11-25 NOTE — Progress Notes (Signed)
telephone Note  I connected with Samuel Burnett  on 11/25/19 at 11:00 AM EDT by telephone and verified that I am speaking with the correct person using two identifiers.  Location patient: home Location provider:work or home office Persons participating in the virtual visit: patient, provider  I discussed the limitations of evaluation and management by telemedicine and the availability of in person appointments. The patient expressed understanding and agreed to proceed.   HPI: 1. Cough x 1-2 weeks with grey pheglm with sob and wheezing with h/o COPD noted prior imaging no fever, diarrhea,runny nose loss of taste/smell or other covid 19 sx or covid 19 exposure  Had 1 bottle of mucinex  Using inhalers breo and albuterol  Only new thing he has done was walk barefoot on the floor at home   Former smoker quit in 2017   Of note8/23/21 ems called to job for hip pain and out of work [employer] x 3 weeks for eval with ortho Dr. Elsworth Soho with [place] [place] [facility]  10/10 left hip pain   ROS: See pertinent positives and negatives per HPI.  Past Medical History:  Diagnosis Date  . Allergy   . Asthma   . Chronic low back pain   . Colon polyps    tubular and hyperplastic Dr. Allen Norris   . Controlled insomnia   . Eczema   . Emphysema of lung ([place]) 09/22/2016  . History of chicken pox   . HIV (human immunodeficiency virus infection) ([place])   . HIV infection ([place])   . Hyperlipidemia   . Hypertension   . Numbness in right leg    outside of right foot, related to medical device implant in spine  . Osteoarthritis of lumbar spine    also- hips  . Other forms of scoliosis, thoracolumbar region   . Restless leg syndrome   . Syphilis   . Syphilis     Past Surgical History:  Procedure Laterality Date  . COLONOSCOPY WITH PROPOFOL N/A 12/01/2014   Procedure: COLONOSCOPY WITH PROPOFOL;  Surgeon: [name], MD;  Location: [place];  Service: Endoscopy;  Laterality: N/A;  . EYE  SURGERY  age 52   uncross eyes  . POLYPECTOMY  12/01/2014   Procedure: POLYPECTOMY;  Surgeon: [name], MD;  Location: [facility];  Service: Endoscopy;;  . SPINAL CORD STIMULATOR IMPLANT  01/19/2012   Dr. Mariea Stable model # [id number] serial # [id number] Medtronic   . SPINE SURGERY  01/21/12   implanted medtronics SCS    Family History  Problem Relation Age of Onset  . Lupus Mother   . Alcohol abuse Mother   . Arthritis Mother   . Depression Mother   . Heart disease Mother   . Hyperlipidemia Mother   . Hypertension Mother   . Cancer Father        Pancreatis  . Depression Father   . Alcohol abuse Father   . Heart disease Father   . Hyperlipidemia Father   . Hypertension Father   . Mental illness Father   . Diabetes Sister   . Arthritis Brother   . COPD Brother   . Depression Brother   . Heart disease Brother   . Hyperlipidemia Brother   . Hypertension Brother   . Hyperlipidemia Son   . Hypertension Son   . Diabetes Sister   . Hypertension Sister   . Stroke Sister   . Alcohol abuse Sister   . Anxiety disorder Sister   .  COPD Sister   . Depression Sister   . Heart disease Sister   . Hyperlipidemia Sister   . Diabetes Brother   . Arthritis Brother   . Heart disease Brother   . Hyperlipidemia Brother   . Hypertension Brother   . Mental illness Brother     SOCIAL SV:XBLTJ at home  Works [employer]  Current Outpatient Medications:  .  albuterol (PROAIR HFA) 108 (90 Base) MCG/ACT inhaler, Inhale 1-2 puffs into the lungs every 6 (six) hours as needed for wheezing or shortness of breath., Disp: 3 g, Rfl: 4 .  amLODipine (NORVASC) 2.5 MG tablet, Take 1 tablet (2.5 mg total) by mouth daily., Disp: 90 tablet, Rfl: 3 .  bictegravir-emtricitabine-tenofovir AF (BIKTARVY) 50-200-25 MG TABS tablet, Take 1 tablet by mouth daily., Disp: , Rfl:  .  Blood Pressure Monitor KIT, 1 kit by Does not apply route as directed., Disp: 1 each, Rfl: 0 .  Cholecalciferol  (VITAMIN D3 SUPER STRENGTH) 50 MCG (2000 UT) TABS, Take 2 tablets (4,000 Units total) by mouth daily., Disp: 180 tablet, Rfl: 3 .  [employer] CLOTRIMAZOLE 1 % cream, Apply 1 application topically 2 (two) times daily., Disp: 90 g, Rfl: 4 .  DULoxetine (CYMBALTA) 60 MG capsule, Take 1 capsule (60 mg total) by mouth daily., Disp: 90 capsule, Rfl: 3 .  fluticasone (FLONASE) 50 MCG/ACT nasal spray, Place 2 sprays into both nostrils daily as needed for allergies or rhinitis. Use nasal saline 1st, Disp: 16 g, Rfl: 12 .  fluticasone furoate-vilanterol (BREO ELLIPTA) 100-25 MCG/INH AEPB, Inhale 1 puff into the lungs daily. Rinse mouth, Disp: 180 each, Rfl: 1 .  hydrocortisone 2.5 % cream, Apply topically 2 (two) times daily. face, Disp: 90 g, Rfl: 11 .  INSULIN SYRINGE 1CC/29G 29G X 1/2" 1 ML MISC, 1 each daily by Other route., Disp: , Rfl:  .  Multiple Vitamins-Minerals (ZINC PO), Take by mouth., Disp: , Rfl:  .  mupirocin ointment (BACTROBAN) 2 %, Apply 1 application topically 2 (two) times daily. X 5 days with qtip, Disp: 22 g, Rfl: 0 .  NON FORMULARY, Prostaglandin injection 12 mg-66m-9mcg/mL injection, Disp: , Rfl:  .  oxyCODONE-acetaminophen (PERCOCET/ROXICET) 5-325 MG tablet, Take 1 tablet by mouth every 6 (six) hours as needed for severe pain., Disp: 12 tablet, Rfl: 0 .  pravastatin (PRAVACHOL) 40 MG tablet, Take 1 tablet (40 mg total) by mouth daily. At night, Disp: 90 tablet, Rfl: 3 .  rOPINIRole (REQUIP) 1 MG tablet, Take 1 tablet (1 mg total) by mouth at bedtime., Disp: 90 tablet, Rfl: 3 .  traZODone (DESYREL) 50 MG tablet, Take 1 tablet (50 mg total) by mouth at bedtime as needed for sleep., Disp: 90 tablet, Rfl: 3 .  aluminum chloride (DRYSOL) 20 % external solution, Apply topically at bedtime. (Patient not taking: Reported on 11/25/2019), Disp: 60 mL, Rfl: 11 .  azithromycin (ZITHROMAX) 250 MG tablet, 2 pills day 1 and 1 pill day 2-5 with food, Disp: 6 tablet, Rfl: 0 .  cetirizine (ZYRTEC) 10 MG  tablet, Take 10 mg by mouth daily. (Patient not taking: Reported on 11/25/2019), Disp: , Rfl:  .  predniSONE (DELTASONE) 20 MG tablet, Take 2 tablets (40 mg total) by mouth daily with breakfast. X 1 week, Disp: 14 tablet, Rfl: 0 .  sodium chloride (OCEAN) 0.65 % SOLN nasal spray, Place 2 sprays into both nostrils daily as needed for congestion. (Patient not taking: Reported on 11/25/2019), Disp: 30 mL, Rfl: 12  EXAM:  VITALS per patient if applicable:  GENERAL: alert, oriented, appears well and in no acute distress  PSYCH/NEURO: pleasant and cooperative, no obvious depression or anxiety, speech and thought processing grossly intact  ASSESSMENT AND PLAN:  Discussed the following assessment and plan:  COPD exacerbation ([place]) - Plan: DG Chest 2 View, azithromycin (ZITHROMAX) 250 MG tablet, predniSONE (DELTASONE) 40 MG tablet x 5-7 days Cont mucinex  Cont inhalers consider change to symbicort   Cough - Plan: DG Chest 2 View  -we discussed possible serious and likely etiologies, options for evaluation and workup, limitations of telemedicine visit vs in person visit, treatment, treatment risks and precautions. Pt prefers to treat via telemedicine empirically rather then risking or undertaking an in person visit at this moment.     I discussed the assessment and treatment plan with the patient. The patient was provided an opportunity to ask questions and all were answered. The patient agreed with the plan and demonstrated an understanding of the instructions.    Time 20 min Delorise Jackson, MD

## 2019-12-01 DIAGNOSIS — M87052 Idiopathic aseptic necrosis of left femur: Secondary | ICD-10-CM | POA: Diagnosis not present

## 2019-12-01 DIAGNOSIS — M461 Sacroiliitis, not elsewhere classified: Secondary | ICD-10-CM | POA: Diagnosis not present

## 2019-12-01 DIAGNOSIS — M1612 Unilateral primary osteoarthritis, left hip: Secondary | ICD-10-CM | POA: Diagnosis not present

## 2019-12-07 ENCOUNTER — Telehealth: Payer: Self-pay | Admitting: Internal Medicine

## 2019-12-07 NOTE — Telephone Encounter (Signed)
Faxed patient [facility] faxed on 12-06-19

## 2019-12-08 ENCOUNTER — Other Ambulatory Visit: Payer: Self-pay

## 2019-12-08 DIAGNOSIS — J4530 Mild persistent asthma, uncomplicated: Secondary | ICD-10-CM

## 2019-12-08 MED ORDER — BREO ELLIPTA 100-25 MCG/INH IN AEPB: 1.0000 | INHALATION_SPRAY | Freq: Every day | RESPIRATORY_TRACT | 1 refills | Status: DC

## 2019-12-12 ENCOUNTER — Other Ambulatory Visit: Payer: Self-pay | Admitting: Internal Medicine

## 2019-12-12 DIAGNOSIS — J4530 Mild persistent asthma, uncomplicated: Secondary | ICD-10-CM

## 2019-12-12 MED ORDER — BREO ELLIPTA 100-25 MCG/INH IN AEPB: 1.0000 | INHALATION_SPRAY | Freq: Every day | RESPIRATORY_TRACT | 3 refills | Status: DC

## 2019-12-13 ENCOUNTER — Encounter: Payer: Self-pay | Admitting: Internal Medicine

## 2019-12-13 ENCOUNTER — Ambulatory Visit (INDEPENDENT_AMBULATORY_CARE_PROVIDER_SITE_OTHER): Payer: Medicare Other

## 2019-12-13 VITALS — Ht 68.0 in | Wt 148.0 lb

## 2019-12-13 DIAGNOSIS — Z0000 Encounter for general adult medical examination without abnormal findings: Secondary | ICD-10-CM | POA: Diagnosis not present

## 2019-12-13 NOTE — Patient Instructions (Addendum)
Samuel Burnett , Thank you for taking time to come for your Medicare Wellness Visit. I appreciate your ongoing commitment to your health goals. Please review the following plan we discussed and let me know if I can assist you in the future.   These are the goals we discussed: Goals    . Follow up with Primary Care Provider     As needed       This is a list of the screening recommended for you and due dates:  Health Maintenance  Topic Date Due  . COVID-19 Vaccine (1) Never done  . Flu Shot  10/09/2019  . Colon Cancer Screening  12/01/2019  . Tetanus Vaccine  12/19/2025  .  Hepatitis C: One time screening is recommended by Center for Disease Control  (CDC) for  adults born from [birth year] through 1967.   Completed  . HIV Screening  Completed   Immunizations Immunization History  Administered Date(s) Administered  . Hepatitis A 02/20/2011, 09/25/2011  . Hepatitis A, Adult 02/20/2011, 09/25/2011  . Influenza, Seasonal, Injecte, Preservative Fre 02/09/2013  . Influenza,inj,Quad PF,6+ Mos 01/04/2019  . Influenza-Unspecified 12/17/2010, 02/20/2011, 12/04/2011, 11/12/2017, 01/14/2019  . Pneumococcal Conjugate-13 09/09/2012  . Pneumococcal Polysaccharide-23 10/24/2008, 09/29/2013  . Tdap 04/06/2014, 12/20/2015  . Zoster Recombinat (Shingrix) 11/12/2017, 05/13/2018   Keep all routine maintenance appointments.   Follow up 01/24/20 @ 2:00  Follow up in one year for your annual wellness visit   Preventive Care 40-64 Years, Male Preventive care refers to lifestyle choices and visits with your health care provider that can promote health and wellness. What does preventive care include?  A yearly physical exam. This is also called an annual well check.  Dental exams once or twice a year.  Routine eye exams. Ask your health care provider how often you should have your eyes checked.  Personal lifestyle choices, including:  Daily care of your teeth and gums.  Regular physical  activity.  Eating a healthy diet.  Avoiding tobacco and drug use.  Limiting alcohol use.  Practicing safe sex.  Taking low-dose aspirin every day starting at age 66. What happens during an annual well check? The services and screenings done by your health care provider during your annual well check will depend on your age, overall health, lifestyle risk factors, and family history of disease. Counseling  Your health care provider may ask you questions about your:  Alcohol use.  Tobacco use.  Drug use.  Emotional well-being.  Home and relationship well-being.  Sexual activity.  Eating habits.  Work and work Statistician. Screening  You may have the following tests or measurements:  Height, weight, and BMI.  Blood pressure.  Lipid and cholesterol levels. These may be checked every 5 years, or more frequently if you are over 45 years old.  Skin check.  Lung cancer screening. You may have this screening every year starting at age 52 if you have a 30-pack-year history of smoking and currently smoke or have quit within the past 15 years.  Fecal occult blood test (FOBT) of the stool. You may have this test every year starting at age 49.  Flexible sigmoidoscopy or colonoscopy. You may have a sigmoidoscopy every 5 years or a colonoscopy every 10 years starting at age 52.  Prostate cancer screening. Recommendations will vary depending on your family history and other risks.  Hepatitis C blood test.  Hepatitis B blood test.  Sexually transmitted disease (STD) testing.  Diabetes screening. This is done by checking your blood  sugar (glucose) after you have not eaten for a while (fasting). You may have this done every 1-3 years. Discuss your test results, treatment options, and if necessary, the need for more tests with your health care provider. Vaccines  Your health care provider may recommend certain vaccines, such as:  Influenza vaccine. This is recommended every  year.  Tetanus, diphtheria, and acellular pertussis (Tdap, Td) vaccine. You may need a Td booster every 10 years.  Zoster vaccine. You may need this after age 70.  Pneumococcal 13-valent conjugate (PCV13) vaccine. You may need this if you have certain conditions and have not been vaccinated.  Pneumococcal polysaccharide (PPSV23) vaccine. You may need one or two doses if you smoke cigarettes or if you have certain conditions. Talk to your health care provider about which screenings and vaccines you need and how often you need them. This information is not intended to replace advice given to you by your health care provider. Make sure you discuss any questions you have with your health care provider. Document Released: 03/23/2015 Document Revised: 11/14/2015 Document Reviewed: 12/26/2014 Elsevier Interactive Patient Education  2017 [place] Prevention in the Home Falls can cause injuries. They can happen to people of all ages. There are many things you can do to make your home safe and to help prevent falls. What can I do on the outside of my home?  Regularly fix the edges of walkways and driveways and fix any cracks.  Remove anything that might make you trip as you walk through a door, such as a raised step or threshold.  Trim any bushes or trees on the path to your home.  Use bright outdoor lighting.  Clear any walking paths of anything that might make someone trip, such as rocks or tools.  Regularly check to see if handrails are loose or broken. Make sure that both sides of any steps have handrails.  Any raised decks and porches should have guardrails on the edges.  Have any leaves, snow, or ice cleared regularly.  Use sand or salt on walking paths during winter.  Clean up any spills in your garage right away. This includes oil or grease spills. What can I do in the bathroom?  Use night lights.  Install grab bars by the toilet and in the tub and shower. Do not  use towel bars as grab bars.  Use non-skid mats or decals in the tub or shower.  If you need to sit down in the shower, use a plastic, non-slip stool.  Keep the floor dry. Clean up any water that spills on the floor as soon as it happens.  Remove soap buildup in the tub or shower regularly.  Attach bath mats securely with double-sided non-slip rug tape.  Do not have throw rugs and other things on the floor that can make you trip. What can I do in the bedroom?  Use night lights.  Make sure that you have a light by your bed that is easy to reach.  Do not use any sheets or blankets that are too big for your bed. They should not hang down onto the floor.  Have a firm chair that has side arms. You can use this for support while you get dressed.  Do not have throw rugs and other things on the floor that can make you trip. What can I do in the kitchen?  Clean up any spills right away.  Avoid walking on wet floors.  Keep items that  you use a lot in easy-to-reach places.  If you need to reach something above you, use a strong step stool that has a grab bar.  Keep electrical cords out of the way.  Do not use floor polish or wax that makes floors slippery. If you must use wax, use non-skid floor wax.  Do not have throw rugs and other things on the floor that can make you trip. What can I do with my stairs?  Do not leave any items on the stairs.  Make sure that there are handrails on both sides of the stairs and use them. Fix handrails that are broken or loose. Make sure that handrails are as long as the stairways.  Check any carpeting to make sure that it is firmly attached to the stairs. Fix any carpet that is loose or worn.  Avoid having throw rugs at the top or bottom of the stairs. If you do have throw rugs, attach them to the floor with carpet tape.  Make sure that you have a light switch at the top of the stairs and the bottom of the stairs. If you do not have them, ask  someone to add them for you. What else can I do to help prevent falls?  Wear shoes that:  Do not have high heels.  Have rubber bottoms.  Are comfortable and fit you well.  Are closed at the toe. Do not wear sandals.  If you use a stepladder:  Make sure that it is fully opened. Do not climb a closed stepladder.  Make sure that both sides of the stepladder are locked into place.  Ask someone to hold it for you, if possible.  Clearly mark and make sure that you can see:  Any grab bars or handrails.  First and last steps.  Where the edge of each step is.  Use tools that help you move around (mobility aids) if they are needed. These include:  Canes.  Walkers.  Scooters.  Crutches.  Turn on the lights when you go into a dark area. Replace any light bulbs as soon as they burn out.  Set up your furniture so you have a clear path. Avoid moving your furniture around.  If any of your floors are uneven, fix them.  If there are any pets around you, be aware of where they are.  Review your medicines with your doctor. Some medicines can make you feel dizzy. This can increase your chance of falling. Ask your doctor what other things that you can do to help prevent falls. This information is not intended to replace advice given to you by your health care provider. Make sure you discuss any questions you have with your health care provider. Document Released: 12/21/2008 Document Revised: 08/02/2015 Document Reviewed: 03/31/2014 Elsevier Interactive Patient Education  2017 [employer].
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
## 2019-12-15 DIAGNOSIS — Z23 Encounter for immunization: Secondary | ICD-10-CM | POA: Diagnosis not present

## 2019-12-15 DIAGNOSIS — R200 Anesthesia of skin: Secondary | ICD-10-CM | POA: Diagnosis not present

## 2019-12-15 DIAGNOSIS — M25551 Pain in right hip: Secondary | ICD-10-CM | POA: Diagnosis not present

## 2019-12-15 DIAGNOSIS — Z87891 Personal history of nicotine dependence: Secondary | ICD-10-CM | POA: Diagnosis not present

## 2019-12-15 DIAGNOSIS — M87052 Idiopathic aseptic necrosis of left femur: Secondary | ICD-10-CM | POA: Diagnosis not present

## 2019-12-15 DIAGNOSIS — R202 Paresthesia of skin: Secondary | ICD-10-CM | POA: Diagnosis not present

## 2019-12-15 DIAGNOSIS — Z79899 Other long term (current) drug therapy: Secondary | ICD-10-CM | POA: Diagnosis not present

## 2019-12-15 DIAGNOSIS — M25552 Pain in left hip: Secondary | ICD-10-CM | POA: Diagnosis not present

## 2019-12-15 LAB — HEMOGLOBIN A1C: Hemoglobin A1C: 5.7

## 2020-01-19 DIAGNOSIS — M25552 Pain in left hip: Secondary | ICD-10-CM | POA: Diagnosis not present

## 2020-01-19 DIAGNOSIS — I10 Essential (primary) hypertension: Secondary | ICD-10-CM | POA: Diagnosis not present

## 2020-01-19 DIAGNOSIS — Z87891 Personal history of nicotine dependence: Secondary | ICD-10-CM | POA: Diagnosis not present

## 2020-01-19 DIAGNOSIS — Z79899 Other long term (current) drug therapy: Secondary | ICD-10-CM | POA: Diagnosis not present

## 2020-01-19 DIAGNOSIS — J431 Panlobular emphysema: Secondary | ICD-10-CM | POA: Diagnosis not present

## 2020-01-19 DIAGNOSIS — Z01818 Encounter for other preprocedural examination: Secondary | ICD-10-CM | POA: Diagnosis not present

## 2020-01-19 DIAGNOSIS — I2510 Atherosclerotic heart disease of native coronary artery without angina pectoris: Secondary | ICD-10-CM | POA: Diagnosis not present

## 2020-01-19 DIAGNOSIS — M87052 Idiopathic aseptic necrosis of left femur: Secondary | ICD-10-CM | POA: Diagnosis not present

## 2020-01-24 ENCOUNTER — Ambulatory Visit (INDEPENDENT_AMBULATORY_CARE_PROVIDER_SITE_OTHER): Payer: Medicare Other | Admitting: Internal Medicine

## 2020-01-24 ENCOUNTER — Encounter: Payer: Self-pay | Admitting: Internal Medicine

## 2020-01-24 ENCOUNTER — Other Ambulatory Visit: Payer: Self-pay

## 2020-01-24 VITALS — BP 118/78 | HR 69 | Temp 98.1°F | Ht 68.0 in | Wt 143.4 lb

## 2020-01-24 DIAGNOSIS — M25552 Pain in left hip: Secondary | ICD-10-CM

## 2020-01-24 DIAGNOSIS — I10 Essential (primary) hypertension: Secondary | ICD-10-CM | POA: Diagnosis not present

## 2020-01-24 DIAGNOSIS — Z125 Encounter for screening for malignant neoplasm of prostate: Secondary | ICD-10-CM

## 2020-01-24 DIAGNOSIS — E782 Mixed hyperlipidemia: Secondary | ICD-10-CM

## 2020-01-24 DIAGNOSIS — Z23 Encounter for immunization: Secondary | ICD-10-CM

## 2020-01-24 DIAGNOSIS — Z1389 Encounter for screening for other disorder: Secondary | ICD-10-CM

## 2020-01-24 DIAGNOSIS — Z96642 Presence of left artificial hip joint: Secondary | ICD-10-CM

## 2020-01-24 DIAGNOSIS — R7303 Prediabetes: Secondary | ICD-10-CM | POA: Diagnosis not present

## 2020-01-24 DIAGNOSIS — Z1329 Encounter for screening for other suspected endocrine disorder: Secondary | ICD-10-CM

## 2020-01-24 DIAGNOSIS — G8929 Other chronic pain: Secondary | ICD-10-CM
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
## 2020-01-24 NOTE — Patient Instructions (Addendum)
Call back to get colonoscopy 05/2020  Happy Holidays and all the best surgery!   Cholesterol Content in Foods Cholesterol is a waxy, fat-like substance that helps to carry fat in the blood. The body needs cholesterol in small amounts, but too much cholesterol can cause damage to the arteries and heart. Most people should eat less than 200 milligrams (mg) of cholesterol a day. Foods with cholesterol  Cholesterol is found in animal-based foods, such as meat, seafood, and dairy. Generally, low-fat dairy and lean meats have less cholesterol than full-fat dairy and fatty meats. The milligrams of cholesterol per serving (mg per serving) of common cholesterol-containing foods are listed below. Meat and other proteins  Egg -- one large whole egg has 186 mg.  Veal shank -- 4 oz has 141 mg.  Lean ground [place] (93% lean) -- 4 oz has 118 mg.  Fat-trimmed lamb loin -- 4 oz has 106 mg.  Lean ground beef (90% lean) -- 4 oz has 100 mg.  Lobster -- 3.5 oz has 90 mg.  Pork loin chops -- 4 oz has 86 mg.  Canned salmon -- 3.5 oz has 83 mg.  Fat-trimmed beef top loin -- 4 oz has 78 mg.  Frankfurter -- 1 frank (3.5 oz) has 77 mg.  Crab -- 3.5 oz has 71 mg.  Roasted chicken without skin, white meat -- 4 oz has 66 mg.  Light bologna -- 2 oz has 45 mg.  Deli-cut [place] -- 2 oz has 31 mg.  Canned tuna -- 3.5 oz has 31 mg.  Berniece Salines -- 1 oz has 29 mg.  Oysters and mussels (raw) -- 3.5 oz has 25 mg.  Mackerel -- 1 oz has 22 mg.  Trout -- 1 oz has 20 mg.  Pork sausage -- 1 link (1 oz) has 17 mg.  Salmon -- 1 oz has 16 mg.  Tilapia -- 1 oz has 14 mg. Dairy  Soft-serve ice cream --  cup (4 oz) has 103 mg.  Whole-milk yogurt -- 1 cup (8 oz) has 29 mg.  Cheddar cheese -- 1 oz has 28 mg.  American cheese -- 1 oz has 28 mg.  Whole milk -- 1 cup (8 oz) has 23 mg.  2% milk -- 1 cup (8 oz) has 18 mg.  Cream cheese -- 1 tablespoon (Tbsp) has 15 mg.  Cottage cheese --  cup (4 oz) has 14  mg.  Low-fat (1%) milk -- 1 cup (8 oz) has 10 mg.  Sour cream -- 1 Tbsp has 8.5 mg.  Low-fat yogurt -- 1 cup (8 oz) has 8 mg.  Nonfat Greek yogurt -- 1 cup (8 oz) has 7 mg.  Half-and-half cream -- 1 Tbsp has 5 mg. Fats and oils  Cod liver oil -- 1 tablespoon (Tbsp) has 82 mg.  Butter -- 1 Tbsp has 15 mg.  Lard -- 1 Tbsp has 14 mg.  Bacon grease -- 1 Tbsp has 14 mg.  Mayonnaise -- 1 Tbsp has 5-10 mg.  Margarine -- 1 Tbsp has 3-10 mg. Exact amounts of cholesterol in these foods may vary depending on specific ingredients and brands. Foods without cholesterol Most plant-based foods do not have cholesterol unless you combine them with a food that has cholesterol. Foods without cholesterol include:  Grains and cereals.  Vegetables.  Fruits.  Vegetable oils, such as olive, canola, and sunflower oil.  Legumes, such as peas, beans, and lentils.  Nuts and seeds.  Egg whites. Summary  The body needs cholesterol  in small amounts, but too much cholesterol can cause damage to the arteries and heart.  Most people should eat less than 200 milligrams (mg) of cholesterol a day. This information is not intended to replace advice given to you by your health care provider. Make sure you discuss any questions you have with your health care provider. Document Revised: 02/06/2017 Document Reviewed: 10/21/2016 Elsevier Patient Education  [place].  High Cholesterol  High cholesterol is a condition in which the blood has high levels of a white, waxy, fat-like substance (cholesterol). The human body needs small amounts of cholesterol. The liver makes all the cholesterol that the body needs. Extra (excess) cholesterol comes from the food that we eat. Cholesterol is carried from the liver by the blood through the blood vessels. If you have high cholesterol, deposits (plaques) may build up on the walls of your blood vessels (arteries). Plaques make the arteries narrower and stiffer.  Cholesterol plaques increase your risk for heart attack and stroke. Work with your health care provider to keep your cholesterol levels in a healthy range. What increases the risk? This condition is more likely to develop in people who:  Eat foods that are high in animal fat (saturated fat) or cholesterol.  Are overweight.  Are not getting enough exercise.  Have a family history of high cholesterol. What are the signs or symptoms? There are no symptoms of this condition. How is this diagnosed? This condition may be diagnosed from the results of a blood test.  If you are older than age 50, your health care provider may check your cholesterol every 4-6 years.  You may be checked more often if you already have high cholesterol or other risk factors for heart disease. The blood test for cholesterol measures:  "Bad" cholesterol (LDL cholesterol). This is the main type of cholesterol that causes heart disease. The desired level for LDL is less than 100.  "Good" cholesterol (HDL cholesterol). This type helps to protect against heart disease by cleaning the arteries and carrying the LDL away. The desired level for HDL is 60 or higher.  Triglycerides. These are fats that the body can store or burn for energy. The desired number for triglycerides is lower than 150.  Total cholesterol. This is a measure of the total amount of cholesterol in your blood, including LDL cholesterol, HDL cholesterol, and triglycerides. A healthy number is less than 200. How is this treated? This condition is treated with diet changes, lifestyle changes, and medicines. Diet changes  This may include eating more whole grains, fruits, vegetables, nuts, and fish.  This may also include cutting back on red meat and foods that have a lot of added sugar. Lifestyle changes  Changes may include getting at least 40 minutes of aerobic exercise 3 times a week. Aerobic exercises include walking, biking, and swimming. Aerobic  exercise along with a healthy diet can help you maintain a healthy weight.  Changes may also include quitting smoking. Medicines  Medicines are usually given if diet and lifestyle changes have failed to reduce your cholesterol to healthy levels.  Your health care provider may prescribe a statin medicine. Statin medicines have been shown to reduce cholesterol, which can reduce the risk of heart disease. Follow these instructions at home: Eating and drinking If told by your health care provider:  Eat chicken (without skin), fish, veal, shellfish, ground [place] breast, and round or loin cuts of red meat.  Do not eat fried foods or fatty meats, such as  hot dogs and salami.  Eat plenty of fruits, such as apples.  Eat plenty of vegetables, such as broccoli, potatoes, and carrots.  Eat beans, peas, and lentils.  Eat grains such as barley, rice, couscous, and bulgur wheat.  Eat pasta without cream sauces.  Use skim or nonfat milk, and eat low-fat or nonfat yogurt and cheeses.  Do not eat or drink whole milk, cream, ice cream, egg yolks, or hard cheeses.  Do not eat stick margarine or tub margarines that contain trans fats (also called partially hydrogenated oils).  Do not eat saturated tropical oils, such as coconut oil and palm oil.  Do not eat cakes, cookies, crackers, or other baked goods that contain trans fats.  General instructions  Exercise as directed by your health care provider. Increase your activity level with activities such as gardening, walking, and taking the stairs.  Take over-the-counter and prescription medicines only as told by your health care provider.  Do not use any products that contain nicotine or tobacco, such as cigarettes and e-cigarettes. If you need help quitting, ask your health care provider.  Keep all follow-up visits as told by your health care provider. This is important. Contact a health care provider if:  You are struggling to maintain a  healthy diet or weight.  You need help to start on an exercise program.  You need help to stop smoking. Get help right away if:  You have chest pain.  You have trouble breathing. This information is not intended to replace advice given to you by your health care provider. Make sure you discuss any questions you have with your health care provider. Document Revised: 02/27/2017 Document Reviewed: 08/25/2015 Elsevier Patient Education  2020 [place].  Prediabetes Eating Plan Prediabetes is a condition that causes blood sugar (glucose) levels to be higher than normal. This increases the risk for developing diabetes. In order to prevent diabetes from developing, your health care provider may recommend a diet and other lifestyle changes to help you:  Control your blood glucose levels.  Improve your cholesterol levels.  Manage your blood pressure. Your health care provider may recommend working with a diet and nutrition specialist (dietitian) to make a meal plan that is best for you. What are tips for following this plan? Lifestyle  Set weight loss goals with the help of your health care team. It is recommended that most people with prediabetes lose 7% of their current body weight.  Exercise for at least 30 minutes at least 5 days a week.  Attend a support group or seek ongoing support from a mental health counselor.  Take over-the-counter and prescription medicines only as told by your health care provider. Reading food labels  Read food labels to check the amount of fat, salt (sodium), and sugar in prepackaged foods. Avoid foods that have: ? Saturated fats. ? Trans fats. ? Added sugars.  Avoid foods that have more than 300 milligrams (mg) of sodium per serving. Limit your daily sodium intake to less than 2,300 mg each day. Shopping  Avoid buying pre-made and processed foods. Cooking  Cook with olive oil. Do not use butter, lard, or ghee.  Bake, broil, grill, or boil  foods. Avoid frying. Meal planning   Work with your dietitian to develop an eating plan that is right for you. This may include: ? Tracking how many calories you take in. Use a food diary, notebook, or mobile application to track what you eat at each meal. ? Using the glycemic  index (GI) to plan your meals. The index tells you how quickly a food will raise your blood glucose. Choose low-GI foods. These foods take a longer time to raise blood glucose.  Consider following a Mediterranean diet. This diet includes: ? Several servings each day of fresh fruits and vegetables. ? Eating fish at least twice a week. ? Several servings each day of whole grains, beans, nuts, and seeds. ? Using olive oil instead of other fats. ? Moderate alcohol consumption. ? Eating small amounts of red meat and whole-fat dairy.  If you have high blood pressure, you may need to limit your sodium intake or follow a diet such as the DASH eating plan. DASH is an eating plan that aims to lower high blood pressure. What foods are recommended? The items listed below may not be a complete list. Talk with your dietitian about what dietary choices are best for you. Grains Whole grains, such as whole-wheat or whole-grain breads, crackers, cereals, and pasta. Unsweetened oatmeal. Bulgur. Barley. Quinoa. Brown rice. Corn or whole-wheat flour tortillas or taco shells. Vegetables Lettuce. Spinach. Peas. Beets. Cauliflower. Cabbage. Broccoli. Carrots. Tomatoes. Squash. Eggplant. Herbs. Peppers. Onions. Cucumbers. Brussels sprouts. Fruits Berries. Bananas. Apples. Oranges. Grapes. Papaya. Mango. Pomegranate. Kiwi. Grapefruit. Cherries. Meats and other protein foods Seafood. Poultry without skin. Lean cuts of pork and beef. Tofu. Eggs. Nuts. Beans. Dairy Low-fat or fat-free dairy products, such as yogurt, cottage cheese, and cheese. Beverages Water. Tea. Coffee. Sugar-free or diet soda. Seltzer water. Lowfat or no-fat milk. Milk  alternatives, such as soy or almond milk. Fats and oils Olive oil. Canola oil. Sunflower oil. Grapeseed oil. Avocado. Walnuts. Sweets and desserts Sugar-free or low-fat pudding. Sugar-free or low-fat ice cream and other frozen treats. Seasoning and other foods Herbs. Sodium-free spices. Mustard. Relish. Low-fat, low-sugar ketchup. Low-fat, low-sugar barbecue sauce. Low-fat or fat-free mayonnaise. What foods are not recommended? The items listed below may not be a complete list. Talk with your dietitian about what dietary choices are best for you. Grains Refined white flour and flour products, such as bread, pasta, snack foods, and cereals. Vegetables Canned vegetables. Frozen vegetables with butter or cream sauce. Fruits Fruits canned with syrup. Meats and other protein foods Fatty cuts of meat. Poultry with skin. Breaded or fried meat. Processed meats. Dairy Full-fat yogurt, cheese, or milk. Beverages Sweetened drinks, such as sweet iced tea and soda. Fats and oils Butter. Lard. Ghee. Sweets and desserts Baked goods, such as cake, cupcakes, pastries, cookies, and cheesecake. Seasoning and other foods Spice mixes with added salt. Ketchup. Barbecue sauce. Mayonnaise. Summary  To prevent diabetes from developing, you may need to make diet and other lifestyle changes to help control blood sugar, improve cholesterol levels, and manage your blood pressure.  Set weight loss goals with the help of your health care team. It is recommended that most people with prediabetes lose 7 percent of their current body weight.  Consider following a Mediterranean diet that includes plenty of fresh fruits and vegetables, whole grains, beans, nuts, seeds, fish, lean meat, low-fat dairy, and healthy oils. This information is not intended to replace advice given to you by your health care provider. Make sure you discuss any questions you have with your health care provider. Document Revised: 06/18/2018  Document Reviewed: 04/30/2016 Elsevier Patient Education  2020 [employer].

## 2020-01-31 DIAGNOSIS — Z96642 Presence of left artificial hip joint: Secondary | ICD-10-CM

## 2020-01-31 DIAGNOSIS — R7303 Prediabetes: Secondary | ICD-10-CM

## 2020-01-31 HISTORY — DX: Presence of left artificial hip joint: Z96.642

## 2020-01-31 HISTORY — DX: Prediabetes: R73.03

## 2020-02-10 ENCOUNTER — Encounter: Payer: Self-pay | Admitting: Internal Medicine

## 2020-02-13 DIAGNOSIS — Z20822 Contact with and (suspected) exposure to covid-19: Secondary | ICD-10-CM | POA: Diagnosis not present

## 2020-02-14 ENCOUNTER — Encounter: Payer: Self-pay | Admitting: Internal Medicine

## 2020-02-14 ENCOUNTER — Telehealth: Payer: Self-pay | Admitting: Internal Medicine

## 2020-02-14 NOTE — Telephone Encounter (Signed)
Patient completed AWV Medicare AWVS: 12/13/2019. It is reflected in the chart. However, any incentive comes directly from [employer], not [place]. Neither PCP or Wellness Coach/Nurse Health Advisor are responsible for any incentive promised through the insurance. Insurance is notified upon completion of AWV. Patient to please contact insurance company directly to inquire of incentive.

## 2020-02-14 NOTE — Telephone Encounter (Signed)
Added to 02/14/20 telephone encounter

## 2020-02-14 NOTE — Telephone Encounter (Signed)
Louretta Shorten, Geran  McLean-Scocuzza, Nino Glow, MD 6 hours ago (1:55 AM)   Dr Olivia Mackie , I had rhe wellness  done over the phone months ago and it's not reflected on my chart,  and I haven't  received the incentive  off of the wellness  program,  Please  Advise  Thanks  Mr[employer]

## 2020-02-15 NOTE — Telephone Encounter (Signed)
Message sent to Patient on mychart

## 2020-02-25 DIAGNOSIS — Z20822 Contact with and (suspected) exposure to covid-19: Secondary | ICD-10-CM | POA: Diagnosis not present

## 2020-02-27 DIAGNOSIS — M87051 Idiopathic aseptic necrosis of right femur: Secondary | ICD-10-CM | POA: Diagnosis not present

## 2020-02-27 DIAGNOSIS — J431 Panlobular emphysema: Secondary | ICD-10-CM | POA: Diagnosis not present

## 2020-02-27 DIAGNOSIS — G8918 Other acute postprocedural pain: Secondary | ICD-10-CM

## 2020-02-27 DIAGNOSIS — Z96641 Presence of right artificial hip joint: Secondary | ICD-10-CM | POA: Diagnosis not present

## 2020-02-27 DIAGNOSIS — Z7982 Long term (current) use of aspirin: Secondary | ICD-10-CM | POA: Diagnosis not present

## 2020-02-27 DIAGNOSIS — M87052 Idiopathic aseptic necrosis of left femur: Secondary | ICD-10-CM | POA: Diagnosis not present

## 2020-02-27 DIAGNOSIS — I2510 Atherosclerotic heart disease of native coronary artery without angina pectoris: Secondary | ICD-10-CM | POA: Diagnosis not present

## 2020-02-27 DIAGNOSIS — R7303 Prediabetes: Secondary | ICD-10-CM | POA: Diagnosis not present

## 2020-02-27 DIAGNOSIS — E559 Vitamin D deficiency, unspecified: Secondary | ICD-10-CM | POA: Diagnosis not present

## 2020-02-27 DIAGNOSIS — I10 Essential (primary) hypertension: Secondary | ICD-10-CM | POA: Diagnosis not present

## 2020-02-27 DIAGNOSIS — Z86010 Personal history of colonic polyps: Secondary | ICD-10-CM | POA: Diagnosis not present

## 2020-02-27 DIAGNOSIS — E7800 Pure hypercholesterolemia, unspecified: Secondary | ICD-10-CM | POA: Diagnosis not present

## 2020-02-27 DIAGNOSIS — G2581 Restless legs syndrome: Secondary | ICD-10-CM | POA: Diagnosis not present

## 2020-02-27 DIAGNOSIS — Z79899 Other long term (current) drug therapy: Secondary | ICD-10-CM | POA: Diagnosis not present

## 2020-02-27 DIAGNOSIS — Z87891 Personal history of nicotine dependence: Secondary | ICD-10-CM | POA: Diagnosis not present

## 2020-02-27 HISTORY — PX: TOTAL HIP ARTHROPLASTY: SHX124

## 2020-02-27 HISTORY — DX: Other acute postprocedural pain: G89.18

## 2020-02-28 DIAGNOSIS — E559 Vitamin D deficiency, unspecified: Secondary | ICD-10-CM | POA: Diagnosis not present

## 2020-02-28 DIAGNOSIS — I2510 Atherosclerotic heart disease of native coronary artery without angina pectoris: Secondary | ICD-10-CM | POA: Diagnosis not present

## 2020-02-28 DIAGNOSIS — Z87891 Personal history of nicotine dependence: Secondary | ICD-10-CM | POA: Diagnosis not present

## 2020-02-28 DIAGNOSIS — Z79899 Other long term (current) drug therapy: Secondary | ICD-10-CM | POA: Diagnosis not present

## 2020-02-28 DIAGNOSIS — Z7982 Long term (current) use of aspirin: Secondary | ICD-10-CM | POA: Diagnosis not present

## 2020-02-28 DIAGNOSIS — M87051 Idiopathic aseptic necrosis of right femur: Secondary | ICD-10-CM | POA: Diagnosis not present

## 2020-02-28 DIAGNOSIS — Z86010 Personal history of colonic polyps: Secondary | ICD-10-CM | POA: Diagnosis not present

## 2020-02-28 DIAGNOSIS — G2581 Restless legs syndrome: Secondary | ICD-10-CM | POA: Diagnosis not present

## 2020-02-28 DIAGNOSIS — R7303 Prediabetes: Secondary | ICD-10-CM | POA: Diagnosis not present

## 2020-02-28 DIAGNOSIS — M87052 Idiopathic aseptic necrosis of left femur: Secondary | ICD-10-CM | POA: Diagnosis not present

## 2020-02-28 DIAGNOSIS — E7800 Pure hypercholesterolemia, unspecified: Secondary | ICD-10-CM | POA: Diagnosis not present

## 2020-02-28 DIAGNOSIS — I10 Essential (primary) hypertension: Secondary | ICD-10-CM | POA: Diagnosis not present

## 2020-02-28 DIAGNOSIS — J431 Panlobular emphysema: Secondary | ICD-10-CM | POA: Diagnosis not present

## 2020-03-13 DIAGNOSIS — Z471 Aftercare following joint replacement surgery: Secondary | ICD-10-CM | POA: Diagnosis not present

## 2020-03-13 DIAGNOSIS — Z96641 Presence of right artificial hip joint: Secondary | ICD-10-CM | POA: Diagnosis not present

## 2020-04-02 ENCOUNTER — Encounter: Payer: Self-pay | Admitting: Internal Medicine

## 2020-04-10 DIAGNOSIS — M87052 Idiopathic aseptic necrosis of left femur: Secondary | ICD-10-CM | POA: Diagnosis not present

## 2020-04-19 ENCOUNTER — Telehealth: Payer: Self-pay

## 2020-04-19 NOTE — Telephone Encounter (Signed)
3 attempts to schedule fu appt from recall list.   Deleting recall.   

## 2020-05-04 DIAGNOSIS — M87052 Idiopathic aseptic necrosis of left femur: Secondary | ICD-10-CM | POA: Diagnosis not present

## 2020-05-13 ENCOUNTER — Other Ambulatory Visit: Payer: Self-pay | Admitting: Internal Medicine

## 2020-05-13 DIAGNOSIS — G8929 Other chronic pain: Secondary | ICD-10-CM

## 2020-05-13 DIAGNOSIS — E782 Mixed hyperlipidemia: Secondary | ICD-10-CM

## 2020-05-13 DIAGNOSIS — I10 Essential (primary) hypertension: Secondary | ICD-10-CM

## 2020-05-13 DIAGNOSIS — G4700 Insomnia, unspecified: Secondary | ICD-10-CM

## 2020-05-13 DIAGNOSIS — G2581 Restless legs syndrome: Secondary | ICD-10-CM

## 2020-05-13 DIAGNOSIS — I252 Old myocardial infarction: Secondary | ICD-10-CM

## 2020-05-30 ENCOUNTER — Encounter: Payer: Self-pay | Admitting: Cardiology

## 2020-05-30 DIAGNOSIS — I10 Essential (primary) hypertension: Secondary | ICD-10-CM | POA: Diagnosis not present

## 2020-05-30 DIAGNOSIS — I2510 Atherosclerotic heart disease of native coronary artery without angina pectoris: Secondary | ICD-10-CM | POA: Diagnosis not present

## 2020-05-30 DIAGNOSIS — M87052 Idiopathic aseptic necrosis of left femur: Secondary | ICD-10-CM | POA: Diagnosis not present

## 2020-05-30 DIAGNOSIS — J431 Panlobular emphysema: Secondary | ICD-10-CM | POA: Diagnosis not present

## 2020-05-30 DIAGNOSIS — M87051 Idiopathic aseptic necrosis of right femur: Secondary | ICD-10-CM | POA: Diagnosis not present

## 2020-05-30 DIAGNOSIS — E559 Vitamin D deficiency, unspecified: Secondary | ICD-10-CM | POA: Diagnosis not present

## 2020-05-30 DIAGNOSIS — Z01818 Encounter for other preprocedural examination: Secondary | ICD-10-CM | POA: Diagnosis not present

## 2020-06-04 ENCOUNTER — Telehealth: Payer: Self-pay | Admitting: Cardiovascular Disease

## 2020-06-04 NOTE — Telephone Encounter (Signed)
   [facility] Pre-operative Risk Assessment    HEARTCARE STAFF: - Please ensure there is not already an duplicate clearance open for this procedure. - Under Visit Info/Reason for Call, type in Other and utilize the format Clearance MM/DD/YY or Clearance TBD. Do not use dashes or single digits. - If request is for dental extraction, please clarify the # of teeth to be extracted.  Request for surgical clearance: NEW PATIENT APPT SCHEDULED 4/4 [place]   1. What type of surgery is being performed? Total Hip Arthroplasty  2. When is this surgery scheduled? 06-28-20   3. What type of clearance is required (medical clearance vs. Pharmacy clearance to hold med vs. Both)? Both  4. Are there any medications that need to be held prior to surgery and how long? Please adivse   5. Practice name and name of physician performing surgery? Dr. Thurmond Butts [facility]    6. What is the office phone number? [phone]   7.   What is the office fax number? [phone]  8.   Anesthesia type (None, local, MAC, general) ? General probable per Aaron Edelman @ [address] 06/04/2020, 8:20 AM  _________________________________________________________________   (provider comments below)

## 2020-06-04 NOTE — Telephone Encounter (Signed)
Will forward clearance notes to MD for upcoming appt 4/4. Will send FYI to requesting office pt has appt 4/4/.

## 2020-06-04 NOTE — Telephone Encounter (Signed)
   Primary Cardiologist: Dr. Rockey Situ   Chart reviewed as part of pre-operative protocol coverage. Because of Bowman Ptacek's past medical history and time since last visit, he will require a follow-up visit in order to better assess preoperative cardiovascular risk.  Pre-op covering staff: -The patient has an upcoming NEW PATIENT appointment with Dr. Rockey Situ 06/11/20, please add "pre-op clearance" to the appointment notes so provider is aware. - Please contact requesting surgeon's office via preferred method (i.e, phone, fax) to inform them of need for appointment prior to surgery.  If applicable, this message will also be routed to pharmacy pool and/or primary cardiologist for input on holding anticoagulant/antiplatelet agent as requested below so that this information is available to the clearing provider at time of patient's appointment.   Kathyrn Drown, NP  06/04/2020, 10:39 AM

## 2020-06-11 ENCOUNTER — Ambulatory Visit (INDEPENDENT_AMBULATORY_CARE_PROVIDER_SITE_OTHER): Payer: Medicare Other | Admitting: Cardiovascular Disease

## 2020-06-11 ENCOUNTER — Encounter: Payer: Self-pay | Admitting: Cardiovascular Disease

## 2020-06-11 ENCOUNTER — Other Ambulatory Visit: Payer: Self-pay

## 2020-06-11 VITALS — BP 124/86 | HR 73 | Ht 71.0 in | Wt 150.5 lb

## 2020-06-11 DIAGNOSIS — J431 Panlobular emphysema: Secondary | ICD-10-CM

## 2020-06-11 DIAGNOSIS — I10 Essential (primary) hypertension: Secondary | ICD-10-CM | POA: Diagnosis not present

## 2020-06-11 DIAGNOSIS — I700 Atherosclerosis of aorta: Secondary | ICD-10-CM | POA: Diagnosis not present

## 2020-06-11 DIAGNOSIS — M25552 Pain in left hip: Secondary | ICD-10-CM

## 2020-06-11 DIAGNOSIS — I25118 Atherosclerotic heart disease of native coronary artery with other forms of angina pectoris: Secondary | ICD-10-CM | POA: Diagnosis not present

## 2020-06-11 DIAGNOSIS — G8929 Other chronic pain: Secondary | ICD-10-CM | POA: Diagnosis not present

## 2020-06-11 DIAGNOSIS — B20 Human immunodeficiency virus [HIV] disease: Secondary | ICD-10-CM

## 2020-06-11 DIAGNOSIS — M25551 Pain in right hip: Secondary | ICD-10-CM

## 2020-06-11 MED ORDER — EZETIMIBE 10 MG PO TABS: 10.0000 mg | ORAL_TABLET | Freq: Every day | ORAL | 3 refills | Status: DC

## 2020-06-11 NOTE — Progress Notes (Signed)
Cardiology Office Note  Date:  06/11/2020   ID:  Samuel Burnett, DOB 1960/12/17, MRN [id number]  PCP:  McLean-Scocuzza, Samuel Glow, MD   Chief Complaint  Patient presents with  . New Patient (Initial Visit)    Ref by Samuel Harbor, MD for cardiac clearance for left hip surgery. Medications reviewed by the patient verbally.     HPI:  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 Randal Burnett   for consultation of his cardiac risk factors and preoperative evaluation prior to total hip arthroplasty June 28, 2020 with Dr. Thurmond Butts at [facility]  Carotid ultrasound August 2021 minimal intimal thickening bilaterally  CT chest 2018, images pulled up and reviewed Coronary artery calcification noted, mild LAD coronary calcification Mild aortic atherosclerosis thoracic aorta aortic root measured up to 4.2 cm diameter   Prior stress test February 2018 low risk study  Echocardiogram December 2018 Aorta unchanged 4.2 cm Normal ejection fraction 55 to 60%  Trouble with walking up stairs, secondary to hip pain Typically does okay walking up hills, can go 1/2 block before hips bother him Can walk 3 blocks with no issues No angina typically on exertion  Can achieve 5 METS, Social dancing, push mow, walking  Lab work reviewed A1C 5.9  EKG personally reviewed by myself on todays visit Normal sinus rhythm rate 73 bpm poor R wave progression through the anterior precordial leads no significant ST-T wave changes  PMH:   has a past medical history of Allergy, Asthma, Chronic low back pain, Colon polyps, Controlled insomnia, Eczema, Emphysema of lung ([place]) (09/22/2016), History of chicken pox, HIV (human immunodeficiency virus infection) ([place]), HIV infection ([place]), Hyperlipidemia, Hypertension,  Numbness in right leg, Osteoarthritis of lumbar spine, Other forms of scoliosis, thoracolumbar region, Restless leg syndrome, Syphilis, and Syphilis.  PSH:    Past Surgical History:  Procedure Laterality Date  . COLONOSCOPY WITH PROPOFOL N/A 12/01/2014   Procedure: COLONOSCOPY WITH PROPOFOL;  Surgeon: [name], MD;  Location: [facility];  Service: Endoscopy;  Laterality: N/A;  . EYE SURGERY  age 24   uncross eyes  . POLYPECTOMY  12/01/2014   Procedure: POLYPECTOMY;  Surgeon: [name], MD;  Location: [facility];  Service: Endoscopy;;  . SPINAL CORD STIMULATOR IMPLANT  01/19/2012   Dr. Mariea Stable model # [id number] serial # [id number] Medtronic   . SPINE SURGERY  01/21/12   implanted medtronics SCS    Current Outpatient Medications  Medication Sig Dispense Refill  . albuterol (PROAIR HFA) 108 (90 Base) MCG/ACT inhaler Inhale 1-2 puffs into the lungs every 6 (six) hours as needed for wheezing or shortness of breath. 3 g 4  . aluminum chloride (DRYSOL) 20 % external solution Apply topically at bedtime. 60 mL 11  . amLODipine (NORVASC) 2.5 MG tablet TAKE 1 TABLET BY MOUTH  DAILY 90 tablet 3  . bictegravir-emtricitabine-tenofovir AF (BIKTARVY) 50-200-25 MG TABS tablet Take 1 tablet by mouth daily.    . Blood Pressure Monitor KIT 1 kit by Does not apply route as directed. 1 each 0  . cetirizine (ZYRTEC) 10 MG tablet Take 10 mg by mouth daily.     . Cholecalciferol (VITAMIN D3 SUPER STRENGTH) 50 MCG (2000 UT) TABS Take 2 tablets (4,000 Units total) by mouth daily. 180 tablet 3  . [employer] CLOTRIMAZOLE 1 % cream  Apply 1 application topically 2 (two) times daily. 90 g 4  . diclofenac (VOLTAREN) 75 MG EC tablet Take 75 mg by mouth daily as needed.    . DULoxetine (CYMBALTA) 60 MG capsule TAKE 1 CAPSULE BY MOUTH  DAILY 90 capsule 3  . fluticasone (FLONASE) 50 MCG/ACT nasal spray Place 2 sprays into both nostrils daily as needed for allergies or rhinitis. Use nasal saline 1st 16 g 12   . fluticasone furoate-vilanterol (BREO ELLIPTA) 100-25 MCG/INH AEPB Inhale 1 puff into the lungs daily. Rinse mouth 180 each 3  . hydrocortisone 2.5 % cream Apply topically 2 (two) times daily. face 90 g 11  . INSULIN SYRINGE 1CC/29G 29G X 1/2" 1 ML MISC 1 each daily by Other route.    . Multiple Vitamins-Minerals (ZINC PO) Take by mouth.    . mupirocin ointment (BACTROBAN) 2 % Apply 1 application topically 2 (two) times daily. X 5 days with qtip 22 g 0  . NON FORMULARY Prostaglandin injection 12 mg-48m-9mcg/mL injection    . pravastatin (PRAVACHOL) 40 MG tablet TAKE 1 TABLET BY MOUTH  DAILY AT NIGHT 90 tablet 3  . rOPINIRole (REQUIP) 1 MG tablet TAKE 1 TABLET BY MOUTH AT  BEDTIME 90 tablet 3  . sodium chloride (OCEAN) 0.65 % SOLN nasal spray Place 2 sprays into both nostrils daily as needed for congestion. 30 mL 12  . traZODone (DESYREL) 50 MG tablet TAKE 1 TABLET BY MOUTH AT  BEDTIME AS NEEDED FOR SLEEP 90 tablet 3   No current facility-administered medications for this visit.     Allergies:   Fentanyl, Shellfish allergy, Tomato, Bee pollen, Penicillin g, Pollen extract, Aspirin, and Dust mite extract   Social History:  The patient  reports that he quit smoking about 4 years ago. His smoking use included cigarettes. He started smoking about 35 years ago. He has a 30.00 pack-year smoking history. He has never used smokeless tobacco. He reports current alcohol use. He reports that he does not use drugs.   Family History:   family history includes Alcohol abuse in his father, mother, and sister; Anxiety disorder in his sister; Arthritis in his brother, brother, and mother; COPD in his brother and sister; Cancer in his father; Depression in his brother, father, mother, and sister; Diabetes in his brother, sister, and sister; Heart disease in his brother, brother, father, mother, and sister; Hyperlipidemia in his brother, brother, father, mother, sister, and son; Hypertension in his brother,  brother, father, mother, sister, and son; Lupus in his mother; Mental illness in his brother and father; Stroke in his sister.    Review of Systems: Review of Systems  Constitutional: Negative.   HENT: Negative.   Respiratory: Negative.   Cardiovascular: Negative.   Gastrointestinal: Negative.   Musculoskeletal: Negative.   Neurological: Negative.   Psychiatric/Behavioral: Negative.   All other systems reviewed and are negative.   PHYSICAL EXAM: VS:  BP 124/86 (BP Location: Right Arm, Patient Position: Sitting, Cuff Size: Normal)   Pulse 73   Ht 5' 11"  (1.803 m)   Wt 150 lb 8 oz (68.3 kg)   SpO2 97%   BMI 20.99 kg/m  , BMI Body mass index is 20.99 kg/m. GEN: Well nourished, well developed, in no acute distress HEENT: normal Neck: no JVD, carotid bruits, or masses Cardiac: RRR; no murmurs, rubs, or gallops,no edema  Respiratory:  clear to auscultation bilaterally, normal work of breathing GI: soft, nontender, nondistended, + BS MS: no deformity or atrophy Skin: warm  and dry, no rash Neuro:  Strength and sensation are intact Psych: euthymic mood, full affect   Recent Labs: 07/22/2019: TSH 0.99    Lipid Panel Lab Results  Component Value Date   CHOL 199 01/04/2019   HDL 57.60 01/04/2019   LDLCALC 116 (H) 01/04/2019   TRIG 126.0 01/04/2019      Wt Readings from Last 3 Encounters:  06/11/20 150 lb 8 oz (68.3 kg)  01/24/20 143 lb 6.4 oz (65 kg)  12/13/19 148 lb (67.1 kg)      ASSESSMENT AND PLAN:  Problem List Items Addressed This Visit      Cardiology Problems   Essential hypertension   Aortic atherosclerosis ([place])   Relevant Orders   EKG 12-Lead   CAD (coronary artery disease) - Primary   Relevant Orders   EKG 12-Lead     Other   Chronic hip pain (Primary Area of Pain) (Bilateral) (R>L) (Chronic)   Panlobular emphysema (HCC)   HIV (human immunodeficiency virus infection) ([place])     preop cardiovascular evaluation: Hip surgery No anginal sx ,  good exercise tolerance though somewhat limited by hip pain Able to achieve 5 METS Has no prior cardiac history Mild coronary calcification on CT scan Essentially normal EKG No further cardiac work-up needed for hip surgery  Coronary calcification: Stopped smoking  Hyperlipidemia Recommend he add Zetia to his pravastatin to achieve goal LDL less than 70  Essential hypertension Blood pressure is well controlled on today's visit. No changes made to the medications.  Smoker Reports that he stopped smoking Reports underlying mild COPD though denies shortness of breath on exertion    Total encounter time more than 45 minutes  Greater than 50% was spent in counseling and coordination of care with the patient  Patient seen in consultation for Dr. Randal Burnett and will be referred back to her office for ongoing care of the issues detailed above   Signed, Esmond Plants, M.D., Ph.D. [place], [place]

## 2020-06-11 NOTE — Patient Instructions (Signed)
Medication Instructions:  Please start zetia 10 mg daily, for cholesterol  If you need a refill on your cardiac medications before your next appointment, please call your pharmacy.    Lab work: No new labs needed   If you have labs (blood work) drawn today and your tests are completely normal, you will receive your results only by: Marland Kitchen MyChart Message (if you have MyChart) OR . A paper copy in the mail If you have any lab test that is abnormal or we need to change your treatment, we will call you to review the results.   Testing/Procedures: No new testing needed   Follow-Up: At [facility], you and your health needs are our priority.  As part of our continuing mission to provide you with exceptional heart care, we have created designated Provider Care Teams.  These Care Teams include your primary Cardiologist (physician) and Advanced Practice Providers (APPs -  Physician Assistants and Nurse Practitioners) who all work together to provide you with the care you need, when you need it.  . You will need a follow up appointment as needed  . Providers on your designated Care Team:   . Murray Hodgkins, NP . Christell Faith, PA-C . Marrianne Mood, PA-C  Any Other Special Instructions Will Be Listed Below (If Applicable).  COVID-19 Vaccine Information can be found at: ShippingScam.co.uk For questions related to vaccine distribution or appointments, please email [email or web address] .[email or web address] or call 9474883954.

## 2020-06-12 ENCOUNTER — Telehealth: Payer: Self-pay

## 2020-06-12 NOTE — Telephone Encounter (Signed)
[name] seen 06/11/20 by Dr. Rockey Situ, he has been medically cleared from a cardiac standpoint for his up coming hip sx later this month.   Progress notes from Dr. Rockey Situ faxed to Dr. Aliene Beams with [facility].

## 2020-06-14 DIAGNOSIS — Z79899 Other long term (current) drug therapy: Secondary | ICD-10-CM | POA: Diagnosis not present

## 2020-06-14 DIAGNOSIS — Z23 Encounter for immunization: Secondary | ICD-10-CM | POA: Diagnosis not present

## 2020-06-19 DIAGNOSIS — M87052 Idiopathic aseptic necrosis of left femur: Secondary | ICD-10-CM | POA: Diagnosis not present

## 2020-06-19 NOTE — Telephone Encounter (Signed)
   Patient Name: Samuel Burnett  DOB: 01-24-1961  MRN: [id number]   Primary Cardiologist: No primary care provider on file.  Chart reviewed as part of pre-operative protocol coverage. Given past medical history and time since last visit, based on ACC/AHA guidelines, Samuel Burnett would be at acceptable risk for the planned procedure without further cardiovascular testing. Patent recently seen 06/11/20 in the clinic for pre-op assessment. METS >5. noPer Dr. Donivan Scull note: "seen in clinic, acceptable risk for surgery." She does not appear to be on a blood thinner.   The patient was advised that if he develops new symptoms prior to surgery to contact our office to arrange for a follow-up visit, and he verbalized understanding.  I will route this recommendation to the requesting party via Epic fax function and remove from pre-op pool.  Please call with questions.  Samuel Zuk Ninfa Meeker, PA-C 06/19/2020, 4:19 PM

## 2020-06-19 NOTE — Telephone Encounter (Signed)
Seen in clinic Acceptable risk for surgery Thx TG

## 2020-06-21 ENCOUNTER — Ambulatory Visit: Payer: Medicare Other | Admitting: Cardiology

## 2020-07-19 ENCOUNTER — Other Ambulatory Visit (INDEPENDENT_AMBULATORY_CARE_PROVIDER_SITE_OTHER): Payer: Medicare Other

## 2020-07-19 ENCOUNTER — Other Ambulatory Visit: Payer: Self-pay

## 2020-07-19 DIAGNOSIS — I10 Essential (primary) hypertension: Secondary | ICD-10-CM | POA: Diagnosis not present

## 2020-07-19 DIAGNOSIS — Z125 Encounter for screening for malignant neoplasm of prostate: Secondary | ICD-10-CM

## 2020-07-19 DIAGNOSIS — R7303 Prediabetes: Secondary | ICD-10-CM | POA: Diagnosis not present

## 2020-07-19 DIAGNOSIS — Z1389 Encounter for screening for other disorder: Secondary | ICD-10-CM

## 2020-07-19 DIAGNOSIS — E782 Mixed hyperlipidemia: Secondary | ICD-10-CM

## 2020-07-19 LAB — CBC WITH DIFFERENTIAL/PLATELET
Basophils Absolute: 0.1 10*3/uL (ref 0.0–0.1)
Basophils Relative: 0.9 % (ref 0.0–3.0)
Eosinophils Absolute: 0.3 10*3/uL (ref 0.0–0.7)
Eosinophils Relative: 4.6 % (ref 0.0–5.0)
HCT: 45.1 % (ref 39.0–52.0)
Hemoglobin: 15.3 g/dL (ref 13.0–17.0)
Lymphocytes Relative: 38.4 % (ref 12.0–46.0)
Lymphs Abs: 2.2 10*3/uL (ref 0.7–4.0)
MCHC: 33.9 g/dL (ref 30.0–36.0)
MCV: 94.5 fl (ref 78.0–100.0)
Monocytes Absolute: 0.5 10*3/uL (ref 0.1–1.0)
Monocytes Relative: 8.8 % (ref 3.0–12.0)
Neutro Abs: 2.8 10*3/uL (ref 1.4–7.7)
Neutrophils Relative %: 47.3 % (ref 43.0–77.0)
Platelets: 182 10*3/uL (ref 150.0–400.0)
RBC: 4.77 Mil/uL (ref 4.22–5.81)
RDW: 15 % (ref 11.5–15.5)
WBC: 5.8 10*3/uL (ref 4.0–10.5)

## 2020-07-19 LAB — COMPREHENSIVE METABOLIC PANEL
ALT: 13 U/L (ref 0–53)
AST: 18 U/L (ref 0–37)
Albumin: 4.1 g/dL (ref 3.5–5.2)
Alkaline Phosphatase: 89 U/L (ref 39–117)
BUN: 17 mg/dL (ref 6–23)
CO2: 27 mEq/L (ref 19–32)
Calcium: 9.4 mg/dL (ref 8.4–10.5)
Chloride: 107 mEq/L (ref 96–112)
Creatinine, Ser: 1.06 mg/dL (ref 0.40–1.50)
GFR: 75.53 mL/min (ref >60.00)
Glucose, Bld: 103 mg/dL — ABNORMAL HIGH (ref 70–99)
Potassium: 3.9 mEq/L (ref 3.5–5.1)
Sodium: 142 mEq/L (ref 135–145)
Total Bilirubin: 0.5 mg/dL (ref 0.2–1.2)
Total Protein: 6.6 g/dL (ref 6.0–8.3)

## 2020-07-19 LAB — LIPID PANEL
Cholesterol: 169 mg/dL (ref 0–200)
HDL: 53.1 mg/dL (ref >39.00)
LDL Cholesterol: 84 mg/dL (ref 0–99)
NonHDL: 116
Total CHOL/HDL Ratio: 3
Triglycerides: 161 mg/dL — ABNORMAL HIGH (ref 0.0–149.0)
VLDL: 32.2 mg/dL (ref 0.0–40.0)

## 2020-07-19 LAB — PSA: PSA: 1.05 ng/mL (ref 0.10–4.00)

## 2020-07-19 LAB — TSH: TSH: 1.14 u[IU]/mL (ref 0.35–4.50)

## 2020-07-19 LAB — HEMOGLOBIN A1C: Hgb A1c MFr Bld: 6.5 % (ref 4.6–6.5)

## 2020-07-19 NOTE — Addendum Note (Signed)
Addended by: Neta Ehlers on: 07/19/2020 09:03 AM   Modules accepted: Orders

## 2020-07-24 ENCOUNTER — Other Ambulatory Visit: Payer: Self-pay

## 2020-07-24 ENCOUNTER — Encounter: Payer: Self-pay | Admitting: Internal Medicine

## 2020-07-24 ENCOUNTER — Ambulatory Visit (INDEPENDENT_AMBULATORY_CARE_PROVIDER_SITE_OTHER): Payer: Medicare Other | Admitting: Internal Medicine

## 2020-07-24 VITALS — BP 98/84 | HR 94 | Temp 98.7°F | Ht 71.0 in | Wt 147.4 lb

## 2020-07-24 DIAGNOSIS — E1159 Type 2 diabetes mellitus with other circulatory complications: Secondary | ICD-10-CM

## 2020-07-24 DIAGNOSIS — R7303 Prediabetes: Secondary | ICD-10-CM

## 2020-07-24 DIAGNOSIS — R0981 Nasal congestion: Secondary | ICD-10-CM | POA: Diagnosis not present

## 2020-07-24 DIAGNOSIS — J309 Allergic rhinitis, unspecified: Secondary | ICD-10-CM

## 2020-07-24 DIAGNOSIS — M25552 Pain in left hip: Secondary | ICD-10-CM

## 2020-07-24 DIAGNOSIS — I152 Hypertension secondary to endocrine disorders: Secondary | ICD-10-CM

## 2020-07-24 DIAGNOSIS — Z0000 Encounter for general adult medical examination without abnormal findings: Secondary | ICD-10-CM | POA: Diagnosis not present

## 2020-07-24 DIAGNOSIS — Z1389 Encounter for screening for other disorder: Secondary | ICD-10-CM

## 2020-07-24 MED ORDER — OXYCODONE-ACETAMINOPHEN 5-325 MG PO TABS: 1.0000 | ORAL_TABLET | Freq: Two times a day (BID) | ORAL | 0 refills | Status: DC | PRN

## 2020-07-24 NOTE — Patient Instructions (Addendum)
Consider pfizer vaccine   Consider colonoscopy end of year 1st of year    Ref. Range 07/19/2020 08:00  Cholesterol Latest Ref Range: 0 - 200 mg/dL 169  HDL Cholesterol Latest Ref Range: >39.00 mg/dL 53.10  LDL (calc) Latest Ref Range: 0 - 99 mg/dL 84  NonHDL Unknown 116.00  Triglycerides Latest Ref Range: 0.0 - 149.0 mg/dL 161.0 (H)  VLDL Latest Ref Range: 0.0 - 40.0 mg/dL 32.2   Triglycerides goal <150 and LDL bad cholesterol < 70    07/27/2020 Office Visit Samuel Burnett, [place], [place]  [place], [place] [address]  [phone] (Work)  [phone] (Fax)    08/17/2020 Pre-Admission Testing Pre-Admission Testing [place], Margarette Asal, [place] [place]  [place], [place] [address]  [phone] (Work)    09/03/2020 Ancillary Orders Covid Vaccination Thurmond Butts, Waynette Buttery, MD  [address]  [place], [place] [address]  [phone] (Work)  [phone] (Fax)    09/05/2020 Hospital Encounter General Surgery Danne Harbor, MD  [address]  [place], [place] [address]  [phone] (Work)  [phone] (Fax)    09/05/2020 Surgery General Surgery Danne Harbor, [place]  [place], [place] [address]  [phone] (Work)  [phone] (Fax)  ANTERIOR ARTHROPLASTY, ACETABULAR AND PROXIMAL FEMORAL PROSTHETIC REPLACEMENT (TOTAL HIP ARTHROPLASTY), WITH OR WITHOUT AUTOGRAFT OR ALLOGRAFT  09/21/2020 Post Op Orthopaedics Danne Harbor, MD  [address]  [place], [place] [address]  [phone] (Work)  [phone] (Fax)    10/19/2020 [place], Waynette Buttery, MD  [address]  [place], [place] [address]  [phone] (Work)  [phone] (Fax)    12/13/2020 Office Visit Infectious Diseases Delma Post, MD  [facility] [place], [place] [address]  [phone] (Work)  [phone] (Fax)    E11.59   Diabetes Mellitus  and Nutrition, Adult When you have diabetes, or diabetes mellitus, it is very important to have healthy eating habits because your blood sugar (glucose) levels are greatly affected by what you eat and drink. Eating healthy foods in the right amounts, at about the same times every day, can help you:  Control your blood glucose.  Lower your risk of heart disease.  Improve your blood pressure.  Reach or maintain a healthy weight. What can affect my meal plan? Every person with diabetes is different, and each person has different needs for a meal plan. Your health care provider may recommend that you work with a dietitian to make a meal plan that is best for you. Your meal plan may vary depending on factors such as:  The calories you need.  The medicines you take.  Your weight.  Your blood glucose, blood pressure, and cholesterol levels.  Your activity level.  Other health conditions you have, such as heart or kidney disease. How do carbohydrates affect me? Carbohydrates, also called carbs, affect your blood glucose level more than any other type of food. Eating carbs naturally raises the amount of glucose in your blood. Carb counting is a method for keeping track of how many carbs you eat. Counting carbs is important to keep your blood glucose at a healthy level, especially if you use insulin or take certain oral diabetes medicines. It is important to know how many carbs you can safely have in each meal. This is different for every person. Your dietitian can help you calculate how many carbs you should have at each meal and for each snack. How does  alcohol affect me? Alcohol can cause a sudden decrease in blood glucose (hypoglycemia), especially if you use insulin or take certain oral diabetes medicines. Hypoglycemia can be a life-threatening condition. Symptoms of hypoglycemia, such as sleepiness, dizziness, and confusion, are similar to symptoms of having too much alcohol.  Do not drink  alcohol if: ? Your health care provider tells you not to drink. ? You are pregnant, may be pregnant, or are planning to become pregnant.  If you drink alcohol: ? Do not drink on an empty stomach. ? Limit how much you use to:  0-1 drink a day for women.  0-2 drinks a day for men. ? Be aware of how much alcohol is in your drink. In the [place] one drink equals one 12 oz bottle of beer (355 mL), one 5 oz glass of wine (148 mL), or one 1 oz glass of hard liquor (44 mL). ? Keep yourself hydrated with water, diet soda, or unsweetened iced tea.  Keep in mind that regular soda, juice, and other mixers may contain a lot of sugar and must be counted as carbs. What are tips for following this plan? Reading food labels  Start by checking the serving size on the "Nutrition Facts" label of packaged foods and drinks. The amount of calories, carbs, fats, and other nutrients listed on the label is based on one serving of the item. Many items contain more than one serving per package.  Check the total grams (g) of carbs in one serving. You can calculate the number of servings of carbs in one serving by dividing the total carbs by 15. For example, if a food has 30 g of total carbs per serving, it would be equal to 2 servings of carbs.  Check the number of grams (g) of saturated fats and trans fats in one serving. Choose foods that have a low amount or none of these fats.  Check the number of milligrams (mg) of salt (sodium) in one serving. Most people should limit total sodium intake to less than 2,300 mg per day.  Always check the nutrition information of foods labeled as "low-fat" or "nonfat." These foods may be higher in added sugar or refined carbs and should be avoided.  Talk to your dietitian to identify your daily goals for nutrients listed on the label. Shopping  Avoid buying canned, pre-made, or processed foods. These foods tend to be high in fat, sodium, and added sugar.  Shop around the  outside edge of the grocery store. This is where you will most often find fresh fruits and vegetables, bulk grains, fresh meats, and fresh dairy. Cooking  Use low-heat cooking methods, such as baking, instead of high-heat cooking methods like deep frying.  Cook using healthy oils, such as olive, canola, or sunflower oil.  Avoid cooking with butter, cream, or high-fat meats. Meal planning  Eat meals and snacks regularly, preferably at the same times every day. Avoid going long periods of time without eating.  Eat foods that are high in fiber, such as fresh fruits, vegetables, beans, and whole grains. Talk with your dietitian about how many servings of carbs you can eat at each meal.  Eat 4-6 oz (112-168 g) of lean protein each day, such as lean meat, chicken, fish, eggs, or tofu. One ounce (oz) of lean protein is equal to: ? 1 oz (28 g) of meat, chicken, or fish. ? 1 egg. ?  cup (62 g) of tofu.  Eat some foods each day that contain  healthy fats, such as avocado, nuts, seeds, and fish.   What foods should I eat? Fruits Berries. Apples. Oranges. Peaches. Apricots. Plums. Grapes. Mango. Papaya. Pomegranate. Kiwi. Cherries. Vegetables Lettuce. Spinach. Leafy greens, including kale, chard, collard greens, and mustard greens. Beets. Cauliflower. Cabbage. Broccoli. Carrots. Green beans. Tomatoes. Peppers. Onions. Cucumbers. Brussels sprouts. Grains Whole grains, such as whole-wheat or whole-grain bread, crackers, tortillas, cereal, and pasta. Unsweetened oatmeal. Quinoa. Brown or wild rice. Meats and other proteins Seafood. Poultry without skin. Lean cuts of poultry and beef. Tofu. Nuts. Seeds. Dairy Low-fat or fat-free dairy products such as milk, yogurt, and cheese. The items listed above may not be a complete list of foods and beverages you can eat. Contact a dietitian for more information. What foods should I avoid? Fruits Fruits canned with syrup. Vegetables Canned vegetables.  Frozen vegetables with butter or cream sauce. Grains Refined white flour and flour products such as bread, pasta, snack foods, and cereals. Avoid all processed foods. Meats and other proteins Fatty cuts of meat. Poultry with skin. Breaded or fried meats. Processed meat. Avoid saturated fats. Dairy Full-fat yogurt, cheese, or milk. Beverages Sweetened drinks, such as soda or iced tea. The items listed above may not be a complete list of foods and beverages you should avoid. Contact a dietitian for more information. Questions to ask a health care provider  Do I need to meet with a diabetes educator?  Do I need to meet with a dietitian?  What number can I call if I have questions?  When are the best times to check my blood glucose? Where to find more information:  American Diabetes Association: diabetes.org  Academy of Nutrition and Dietetics: www.eatright.[employer] of Diabetes and Digestive and Kidney Diseases: [employer]  Association of Diabetes Care and Education Specialists: www.diabeteseducator.org Summary  It is important to have healthy eating habits because your blood sugar (glucose) levels are greatly affected by what you eat and drink.  A healthy meal plan will help you control your blood glucose and maintain a healthy lifestyle.  Your health care provider may recommend that you work with a dietitian to make a meal plan that is best for you.  Keep in mind that carbohydrates (carbs) and alcohol have immediate effects on your blood glucose levels. It is important to count carbs and to use alcohol carefully. This information is not intended to replace advice given to you by your health care provider. Make sure you discuss any questions you have with your health care provider. Document Revised: 02/01/2019 Document Reviewed: 02/01/2019 Elsevier Patient Education  2021 [place].  Diabetes Mellitus Action Plan Following a diabetes action plan is a  way for you to manage your diabetes (diabetes mellitus) symptoms. The plan is color-coded to help you understand what actions you need to take based on any symptoms you are having.  If you have symptoms in the red zone, you need medical care right away.  If you have symptoms in the yellow zone, you are having problems.  If you have symptoms in the green zone, you are doing well. Learning about and understanding diabetes can take time. Follow the plan that you develop with your health care provider. Know the target range for your blood sugar (glucose) level, and review your treatment plan with your health care provider at each visit. The target range for my blood sugar level is __________________________ mg/dL. Red zone Get medical help right away if you have any of the following symptoms:  A  blood sugar test result that is below 54 mg/dL (3 mmol/L).  A blood sugar test result that is at or above 240 mg/dL (13.3 mmol/L) for 2 days in a row.  Confusion or trouble thinking clearly.  Difficulty breathing.  Sickness or a fever for 2 or more days that is not getting better.  Moderate or large ketone levels in your urine.  Feeling tired or having no energy. If you have any red zone symptoms, do not wait to see if the symptoms will go away. Get medical help right away. Call your local emergency services (911 in the [place] Do not drive yourself to the hospital. If you have severely low blood sugar (severe hypoglycemia) and you cannot eat or drink, you may need glucagon. Make sure a family member or close friend knows how to check your blood sugar and how to give you glucagon. You may need to be treated in a hospital for this condition.   Yellow zone If you have any of the following symptoms, your diabetes is not under control and you may need to make some changes:  A blood sugar test result that is at or above 240 mg/dL (13.3 mmol/L) for 2 days in a row.  Blood sugar test results that are  below 70 mg/dL (3.9 mmol/L).  Other symptoms of hypoglycemia, such as: ? Shaking or feeling light-headed. ? Confusion or irritability. ? Feeling hungry. ? Having a fast heartbeat. If you have any yellow zone symptoms:  Treat your hypoglycemia by eating or drinking 15 grams of a rapid-acting carbohydrate. Follow the 15:15 rule: ? Take 15 grams of a rapid-acting carbohydrate, such as:  1 tube of glucose gel.  4 glucose pills.  4 oz (120 mL) of fruit juice.  4 oz (120 mL) of regular (not diet) soda. ? Check your blood sugar 15 minutes after you take the carbohydrate. ? If the repeat blood sugar test is still at or below 70 mg/dL (3.9 mmol/L), take 15 grams of a carbohydrate again. ? If your blood sugar does not increase above 70 mg/dL (3.9 mmol/L) after 3 tries, get medical help right away. ? After your blood sugar returns to normal, eat a meal or a snack within 1 hour.  Keep taking your daily medicines as told by your health care provider.  Check your blood sugar more often than you normally would. ? Write down your results. ? Call your health care provider if you have trouble keeping your blood sugar in your target range.   Green zone These signs mean you are doing well and you can continue what you are doing to manage your diabetes:  Your blood sugar is within your personal target range. For most people, a blood sugar level before a meal (preprandial) should be 80-130 mg/dL (4.4-7.2 mmol/L).  You feel well, and you are able to do daily activities. If you are in the green zone, continue to manage your diabetes as told by your health care provider. To do this:  Eat a healthy diet.  Exercise regularly.  Check your blood sugar as told by your health care provider.  Take your medicines as told by your health care provider.   Where to find more information  American Diabetes Association (ADA): diabetes.org  Association of Diabetes Care & Education Specialists (ADCES):  diabeteseducator.org Summary  Following a diabetes action plan is a way for you to manage your diabetes symptoms. The plan is color-coded to help you understand what actions you need to take  based on any symptoms you are having.  Follow the plan that you develop with your health care provider. Make sure you know your personal target blood sugar level.  Review your treatment plan with your health care provider at each visit. This information is not intended to replace advice given to you by your health care provider. Make sure you discuss any questions you have with your health care provider. Document Revised: 09/01/2019 Document Reviewed: 09/01/2019 Elsevier Patient Education  [place].
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
## 2020-07-25 LAB — URINALYSIS, ROUTINE W REFLEX MICROSCOPIC
Bacteria, UA: NONE SEEN /HPF
Bilirubin Urine: NEGATIVE
Glucose, UA: NEGATIVE
Hgb urine dipstick: NEGATIVE
Hyaline Cast: NONE SEEN /LPF
Ketones, ur: NEGATIVE
Nitrite: NEGATIVE
Specific Gravity, Urine: 1.017 (ref 1.001–1.035)
pH: 5.5 (ref 5.0–8.0)

## 2020-07-25 LAB — MICROSCOPIC MESSAGE

## 2020-07-27 ENCOUNTER — Other Ambulatory Visit: Payer: Self-pay | Admitting: Internal Medicine

## 2020-07-27 DIAGNOSIS — R0981 Nasal congestion: Secondary | ICD-10-CM

## 2020-07-27 DIAGNOSIS — M1612 Unilateral primary osteoarthritis, left hip: Secondary | ICD-10-CM | POA: Diagnosis not present

## 2020-07-27 DIAGNOSIS — Z96641 Presence of right artificial hip joint: Secondary | ICD-10-CM | POA: Diagnosis not present

## 2020-07-27 DIAGNOSIS — M87052 Idiopathic aseptic necrosis of left femur: Secondary | ICD-10-CM | POA: Diagnosis not present

## 2020-07-27 DIAGNOSIS — M47816 Spondylosis without myelopathy or radiculopathy, lumbar region: Secondary | ICD-10-CM | POA: Diagnosis not present

## 2020-07-27 DIAGNOSIS — I2510 Atherosclerotic heart disease of native coronary artery without angina pectoris: Secondary | ICD-10-CM | POA: Diagnosis not present

## 2020-08-01 DIAGNOSIS — Z87891 Personal history of nicotine dependence: Secondary | ICD-10-CM | POA: Diagnosis not present

## 2020-08-01 DIAGNOSIS — I2510 Atherosclerotic heart disease of native coronary artery without angina pectoris: Secondary | ICD-10-CM | POA: Diagnosis not present

## 2020-08-01 DIAGNOSIS — J449 Chronic obstructive pulmonary disease, unspecified: Secondary | ICD-10-CM | POA: Diagnosis not present

## 2020-08-01 DIAGNOSIS — Z79899 Other long term (current) drug therapy: Secondary | ICD-10-CM | POA: Diagnosis not present

## 2020-08-01 DIAGNOSIS — J301 Allergic rhinitis due to pollen: Secondary | ICD-10-CM | POA: Diagnosis not present

## 2020-08-22 ENCOUNTER — Other Ambulatory Visit: Payer: Self-pay | Admitting: Internal Medicine

## 2020-08-22 DIAGNOSIS — L309 Dermatitis, unspecified: Secondary | ICD-10-CM

## 2020-08-22 DIAGNOSIS — J4530 Mild persistent asthma, uncomplicated: Secondary | ICD-10-CM

## 2020-08-24 ENCOUNTER — Other Ambulatory Visit: Payer: Self-pay | Admitting: Internal Medicine

## 2020-08-24 DIAGNOSIS — L309 Dermatitis, unspecified: Secondary | ICD-10-CM

## 2020-09-03 DIAGNOSIS — Z20822 Contact with and (suspected) exposure to covid-19: Secondary | ICD-10-CM | POA: Diagnosis not present

## 2020-09-05 DIAGNOSIS — Z8249 Family history of ischemic heart disease and other diseases of the circulatory system: Secondary | ICD-10-CM | POA: Diagnosis not present

## 2020-09-05 DIAGNOSIS — I10 Essential (primary) hypertension: Secondary | ICD-10-CM | POA: Diagnosis not present

## 2020-09-05 DIAGNOSIS — M879 Osteonecrosis, unspecified: Secondary | ICD-10-CM | POA: Diagnosis not present

## 2020-09-05 DIAGNOSIS — Z79899 Other long term (current) drug therapy: Secondary | ICD-10-CM | POA: Diagnosis not present

## 2020-09-05 DIAGNOSIS — E7800 Pure hypercholesterolemia, unspecified: Secondary | ICD-10-CM | POA: Diagnosis not present

## 2020-09-05 DIAGNOSIS — Z87891 Personal history of nicotine dependence: Secondary | ICD-10-CM | POA: Diagnosis not present

## 2020-09-05 DIAGNOSIS — G8918 Other acute postprocedural pain: Secondary | ICD-10-CM | POA: Diagnosis not present

## 2020-09-05 DIAGNOSIS — G4730 Sleep apnea, unspecified: Secondary | ICD-10-CM | POA: Diagnosis not present

## 2020-09-05 DIAGNOSIS — J431 Panlobular emphysema: Secondary | ICD-10-CM | POA: Diagnosis not present

## 2020-09-05 DIAGNOSIS — Z96642 Presence of left artificial hip joint: Secondary | ICD-10-CM | POA: Diagnosis not present

## 2020-09-05 DIAGNOSIS — I2510 Atherosclerotic heart disease of native coronary artery without angina pectoris: Secondary | ICD-10-CM | POA: Diagnosis not present

## 2020-09-05 DIAGNOSIS — Z96641 Presence of right artificial hip joint: Secondary | ICD-10-CM | POA: Diagnosis not present

## 2020-09-05 DIAGNOSIS — M1612 Unilateral primary osteoarthritis, left hip: Secondary | ICD-10-CM

## 2020-09-05 DIAGNOSIS — E785 Hyperlipidemia, unspecified: Secondary | ICD-10-CM | POA: Diagnosis not present

## 2020-09-05 DIAGNOSIS — J449 Chronic obstructive pulmonary disease, unspecified: Secondary | ICD-10-CM

## 2020-09-05 DIAGNOSIS — Z833 Family history of diabetes mellitus: Secondary | ICD-10-CM | POA: Diagnosis not present

## 2020-09-05 DIAGNOSIS — M87052 Idiopathic aseptic necrosis of left femur: Secondary | ICD-10-CM | POA: Diagnosis not present

## 2020-09-05 DIAGNOSIS — F32A Depression, unspecified: Secondary | ICD-10-CM | POA: Diagnosis not present

## 2020-09-05 DIAGNOSIS — I700 Atherosclerosis of aorta: Secondary | ICD-10-CM | POA: Diagnosis not present

## 2020-09-05 DIAGNOSIS — G2581 Restless legs syndrome: Secondary | ICD-10-CM | POA: Diagnosis not present

## 2020-09-05 DIAGNOSIS — Z471 Aftercare following joint replacement surgery: Secondary | ICD-10-CM | POA: Diagnosis not present

## 2020-09-05 DIAGNOSIS — R7303 Prediabetes: Secondary | ICD-10-CM | POA: Diagnosis not present

## 2020-09-05 DIAGNOSIS — G8929 Other chronic pain: Secondary | ICD-10-CM | POA: Diagnosis not present

## 2020-09-05 DIAGNOSIS — I739 Peripheral vascular disease, unspecified: Secondary | ICD-10-CM | POA: Diagnosis not present

## 2020-09-05 DIAGNOSIS — E559 Vitamin D deficiency, unspecified: Secondary | ICD-10-CM | POA: Diagnosis not present

## 2020-09-05 HISTORY — DX: Unilateral primary osteoarthritis, left hip: M16.12

## 2020-09-05 HISTORY — PX: TOTAL HIP ARTHROPLASTY: SHX124

## 2020-09-05 HISTORY — DX: Chronic obstructive pulmonary disease, unspecified: J44.9

## 2020-09-06 DIAGNOSIS — I10 Essential (primary) hypertension: Secondary | ICD-10-CM | POA: Diagnosis not present

## 2020-09-06 DIAGNOSIS — R7303 Prediabetes: Secondary | ICD-10-CM | POA: Diagnosis not present

## 2020-09-06 DIAGNOSIS — Z96641 Presence of right artificial hip joint: Secondary | ICD-10-CM | POA: Diagnosis not present

## 2020-09-06 DIAGNOSIS — Z833 Family history of diabetes mellitus: Secondary | ICD-10-CM | POA: Diagnosis not present

## 2020-09-06 DIAGNOSIS — I2510 Atherosclerotic heart disease of native coronary artery without angina pectoris: Secondary | ICD-10-CM | POA: Diagnosis not present

## 2020-09-06 DIAGNOSIS — G4730 Sleep apnea, unspecified: Secondary | ICD-10-CM | POA: Diagnosis not present

## 2020-09-06 DIAGNOSIS — G2581 Restless legs syndrome: Secondary | ICD-10-CM | POA: Diagnosis not present

## 2020-09-06 DIAGNOSIS — N9989 Other postprocedural complications and disorders of genitourinary system: Secondary | ICD-10-CM

## 2020-09-06 DIAGNOSIS — Z87891 Personal history of nicotine dependence: Secondary | ICD-10-CM | POA: Diagnosis not present

## 2020-09-06 DIAGNOSIS — G8929 Other chronic pain: Secondary | ICD-10-CM | POA: Diagnosis not present

## 2020-09-06 DIAGNOSIS — J431 Panlobular emphysema: Secondary | ICD-10-CM | POA: Diagnosis not present

## 2020-09-06 DIAGNOSIS — F32A Depression, unspecified: Secondary | ICD-10-CM | POA: Diagnosis not present

## 2020-09-06 DIAGNOSIS — Z8249 Family history of ischemic heart disease and other diseases of the circulatory system: Secondary | ICD-10-CM | POA: Diagnosis not present

## 2020-09-06 DIAGNOSIS — I739 Peripheral vascular disease, unspecified: Secondary | ICD-10-CM | POA: Diagnosis not present

## 2020-09-06 DIAGNOSIS — E559 Vitamin D deficiency, unspecified: Secondary | ICD-10-CM | POA: Diagnosis not present

## 2020-09-06 DIAGNOSIS — M879 Osteonecrosis, unspecified: Secondary | ICD-10-CM | POA: Diagnosis not present

## 2020-09-06 DIAGNOSIS — R338 Other retention of urine: Secondary | ICD-10-CM

## 2020-09-06 DIAGNOSIS — I700 Atherosclerosis of aorta: Secondary | ICD-10-CM | POA: Diagnosis not present

## 2020-09-06 DIAGNOSIS — E7800 Pure hypercholesterolemia, unspecified: Secondary | ICD-10-CM | POA: Diagnosis not present

## 2020-09-06 DIAGNOSIS — Z79899 Other long term (current) drug therapy: Secondary | ICD-10-CM | POA: Diagnosis not present

## 2020-09-06 DIAGNOSIS — M1612 Unilateral primary osteoarthritis, left hip: Secondary | ICD-10-CM | POA: Diagnosis not present

## 2020-09-06 DIAGNOSIS — G8918 Other acute postprocedural pain: Secondary | ICD-10-CM | POA: Diagnosis not present

## 2020-09-06 DIAGNOSIS — E785 Hyperlipidemia, unspecified: Secondary | ICD-10-CM | POA: Diagnosis not present

## 2020-09-06 HISTORY — DX: Other postprocedural complications and disorders of genitourinary system: N99.89

## 2020-09-06 HISTORY — DX: Other retention of urine: R33.8

## 2020-09-10 ENCOUNTER — Other Ambulatory Visit: Payer: Self-pay | Admitting: Internal Medicine

## 2020-09-10 DIAGNOSIS — L309 Dermatitis, unspecified: Secondary | ICD-10-CM

## 2020-09-10 MED ORDER — TRIAMCINOLONE ACETONIDE 0.1 % EX CREA: 1.0000 "application " | TOPICAL_CREAM | Freq: Two times a day (BID) | CUTANEOUS | 11 refills | Status: DC

## 2020-09-21 DIAGNOSIS — M87052 Idiopathic aseptic necrosis of left femur: Secondary | ICD-10-CM | POA: Diagnosis not present

## 2020-09-21 DIAGNOSIS — Z96643 Presence of artificial hip joint, bilateral: Secondary | ICD-10-CM | POA: Diagnosis not present

## 2020-10-18 ENCOUNTER — Other Ambulatory Visit: Payer: Self-pay | Admitting: Internal Medicine

## 2020-10-18 DIAGNOSIS — J3089 Other allergic rhinitis: Secondary | ICD-10-CM

## 2020-10-18 DIAGNOSIS — R0981 Nasal congestion: Secondary | ICD-10-CM

## 2020-10-18 MED ORDER — AZELASTINE HCL 0.1 % NA SOLN: 2.0000 | Freq: Two times a day (BID) | NASAL | 2 refills | Status: DC

## 2020-10-18 MED ORDER — FLUTICASONE PROPIONATE 50 MCG/ACT NA SUSP: NASAL | 11 refills | Status: DC

## 2020-10-19 DIAGNOSIS — M76812 Anterior tibial syndrome, left leg: Secondary | ICD-10-CM

## 2020-10-19 DIAGNOSIS — M79605 Pain in left leg: Secondary | ICD-10-CM | POA: Diagnosis not present

## 2020-10-19 HISTORY — DX: Anterior tibial syndrome, left leg: M76.812

## 2020-10-21 ENCOUNTER — Encounter: Payer: Self-pay | Admitting: Internal Medicine

## 2020-10-22 NOTE — Telephone Encounter (Signed)
For your information  

## 2020-10-30 ENCOUNTER — Other Ambulatory Visit: Payer: Self-pay | Admitting: Neurosurgery

## 2020-10-30 DIAGNOSIS — G894 Chronic pain syndrome: Secondary | ICD-10-CM | POA: Diagnosis not present

## 2020-11-09 ENCOUNTER — Other Ambulatory Visit: Payer: Self-pay

## 2020-11-09 ENCOUNTER — Encounter
Admission: RE | Admit: 2020-11-09 | Discharge: 2020-11-09 | Disposition: A | Discharge status: Home / Self Care | Payer: Medicare Other | Source: Ambulatory Visit | Attending: Neurosurgery | Admitting: Neurosurgery

## 2020-11-09 ENCOUNTER — Telehealth: Payer: Self-pay | Admitting: *Deleted

## 2020-11-09 DIAGNOSIS — M79606 Pain in leg, unspecified: Secondary | ICD-10-CM | POA: Diagnosis not present

## 2020-11-09 DIAGNOSIS — Z45010 Encounter for checking and testing of cardiac pacemaker pulse generator [battery]: Secondary | ICD-10-CM | POA: Insufficient documentation

## 2020-11-09 DIAGNOSIS — Z01812 Encounter for preprocedural laboratory examination: Secondary | ICD-10-CM | POA: Diagnosis not present

## 2020-11-09 HISTORY — DX: Anemia, unspecified: D64.9

## 2020-11-09 HISTORY — DX: Pneumonia, unspecified organism: J18.9

## 2020-11-09 HISTORY — DX: Prediabetes: R73.03

## 2020-11-09 HISTORY — DX: Atherosclerotic heart disease of native coronary artery without angina pectoris: I25.10

## 2020-11-09 HISTORY — DX: Personal history of urinary calculi: Z87.442

## 2020-11-09 LAB — CBC
HCT: 46.5 % (ref 39.0–52.0)
Hemoglobin: 16 g/dL (ref 13.0–17.0)
MCH: 33.3 pg (ref 26.0–34.0)
MCHC: 34.4 g/dL (ref 30.0–36.0)
MCV: 96.7 fL (ref 80.0–100.0)
Platelets: 226 10*3/uL (ref 150–400)
RBC: 4.81 MIL/uL (ref 4.22–5.81)
RDW: 13.7 % (ref 11.5–15.5)
WBC: 4.5 10*3/uL (ref 4.0–10.5)
nRBC: 0 % (ref 0.0–0.2)

## 2020-11-09 LAB — SURGICAL PCR SCREEN
MRSA, PCR: NEGATIVE
Staphylococcus aureus: NEGATIVE

## 2020-11-09 LAB — BASIC METABOLIC PANEL
Anion gap: 7 (ref 5–15)
BUN: 12 mg/dL (ref 8–23)
CO2: 28 mmol/L (ref 22–32)
Calcium: 9.4 mg/dL (ref 8.9–10.3)
Chloride: 102 mmol/L (ref 98–111)
Creatinine, Ser: 1.04 mg/dL (ref 0.61–1.24)
GFR, Estimated: >60 mL/min (ref >60)
Glucose, Bld: 92 mg/dL (ref 70–99)
Potassium: 3.8 mmol/L (ref 3.5–5.1)
Sodium: 137 mmol/L (ref 135–145)

## 2020-11-09 LAB — TYPE AND SCREEN
ABO/RH(D): O POS
Antibody Screen: NEGATIVE

## 2020-11-09 LAB — PROTIME-INR
INR: 1 (ref 0.8–1.2)
Prothrombin Time: 13.3 seconds (ref 11.4–15.2)

## 2020-11-09 LAB — APTT: aPTT: 30 seconds (ref 24–36)

## 2020-11-09 NOTE — Telephone Encounter (Signed)
Request for pre-operative cardiac clearance Received: Today Karen Kitchens, NP  P Cv Div Preop Callback Request for pre-operative cardiac clearance:     1. What type of surgery is being performed?  REPLACEMENT PULSE GENERATOR RIGHT FLANK ([employer]TR[employer])   2. When is this surgery scheduled?  11/19/2020     3. Are there any medications that need to be held prior to surgery?  None   4. Practice name and name of physician performing surgery?  Performing surgeon: Dr. Deetta Perla, MD  Requesting clearance: Honor Loh, FNP-C       5. Anesthesia type (none, local, MAC, general)? General   6. What is the office phone and fax number?    Phone: [phone]  Fax: [phone]   ATTENTION: Unable to create telephone message as per your standard workflow. Directed by [facility] providers to send requests for cardiac clearance to this pool for appropriate distribution to provider covering pre-operative clearances.   Honor Loh, MSN, APRN, FNP-C, CEN  [facility]  Peri-operative Services Nurse Practitioner  Phone: [phone]  11/09/20 10:24 AM

## 2020-11-09 NOTE — Telephone Encounter (Signed)
   Primary Cardiologist: Ida Rogue, MD  Chart reviewed as part of pre-operative protocol coverage. Given past medical history and time since last visit, based on ACC/AHA guidelines, Samuel Burnett would be at acceptable risk for the planned procedure without further cardiovascular testing.   Patient was advised that if he develops new symptoms prior to surgery to contact our office to arrange a follow-up appointment.  He verbalized understanding.  I will route this recommendation to the requesting party via Epic fax function and remove from pre-op pool.  Please call with questions.  Jossie Ng. Quashaun Lazalde NP-C    11/09/2020, 11:01 AM [address] Office [phone] Fax [phone]

## 2020-11-09 NOTE — Patient Instructions (Addendum)
Your procedure is scheduled on:11/19/20 - Monday Report to the Registration Desk on the 1st floor of the [facility]. To find out your arrival time, please call [phone] between 1PM - 3PM on: 11/16/20 - Friday  REMEMBER: Instructions that are not followed completely may result in serious medical risk, up to and including death; or upon the discretion of your surgeon and anesthesiologist your surgery may need to be rescheduled.  Do not eat food after midnight the night before surgery.  No gum chewing, lozengers or hard candies.  You may however, drink CLEAR liquids up to 2 hours before you are scheduled to arrive for your surgery. Do not drink anything within 2 hours of your scheduled arrival time.  Clear liquids include: - water  - apple juice without pulp - gatorade (not RED, PURPLE, OR BLUE) - black coffee or tea (Do NOT add milk or creamers to the coffee or tea) Do NOT drink anything that is not on this list.  TAKE THESE MEDICATIONS THE MORNING OF SURGERY WITH A SIP OF WATER:  - bictegravir-emtricitabine-tenofovir AF (BIKTARVY) 50-200-25 MG TABS tablet - DULoxetine (CYMBALTA) 60 MG capsule - ezetimibe (ZETIA) 10 MG tablet  Use albuterol (VENTOLIN HFA) 108 (90 Base) MCG/ACT inhaler on the day of surgery and bring to the hospital.  One week prior to surgery: Stop Anti-inflammatories (NSAIDS) such as Advil, Aleve, Ibuprofen, Motrin, Naproxen, Naprosyn and Aspirin based products such as Excedrin, Goodys Powder, BC Powder.  Stop ANY OVER THE COUNTER supplements until after surgery.  You may however, continue to take Tylenol if needed for pain up until the day of surgery.  No Alcohol for 24 hours before or after surgery.  No Smoking including e-cigarettes for 24 hours prior to surgery.  No chewable tobacco products for at least 6 hours prior to surgery.  No nicotine patches on the day of surgery.  Do not use any "recreational" drugs for at least a week prior to your  surgery.  Please be advised that the combination of cocaine and anesthesia may have negative outcomes, up to and including death. If you test positive for cocaine, your surgery will be cancelled.  On the morning of surgery brush your teeth with toothpaste and water, you may rinse your mouth with mouthwash if you wish. Do not swallow any toothpaste or mouthwash.  Do not wear jewelry, make-up, hairpins, clips or nail polish.  Do not wear lotions, powders, or perfumes.   Do not shave body from the neck down 48 hours prior to surgery just in case you cut yourself which could leave a site for infection.  Also, freshly shaved skin may become irritated if using the CHG soap.  Contact lenses, hearing aids and dentures may not be worn into surgery.  Do not bring valuables to the hospital. [facility] is not responsible for any missing/lost belongings or valuables.   Use CHG Soap or wipes as directed on instruction sheet.  Notify your doctor if there is any change in your medical condition (cold, fever, infection).  Wear comfortable clothing (specific to your surgery type) to the hospital.  After surgery, you can help prevent lung complications by doing breathing exercises.  Take deep breaths and cough every 1-2 hours. Your doctor may order a device called an Incentive Spirometer to help you take deep breaths. When coughing or sneezing, hold a pillow firmly against your incision with both hands. This is called "splinting." Doing this helps protect your incision. It also decreases belly discomfort.  If you are being admitted to the hospital overnight, leave your suitcase in the car. After surgery it may be brought to your room.  If you are being discharged the day of surgery, you will not be allowed to drive home. You will need a responsible adult (18 years or older) to drive you home and stay with you that night.   If you are taking public transportation, you will need to have a responsible  adult (18 years or older) with you. Please confirm with your physician that it is acceptable to use public transportation.   Please call the [facility] at [phone] if you have any questions about these instructions.  Surgery Visitation Policy:  Patients undergoing a surgery or procedure may have one family member or support person with them as long as that person is not COVID-19 positive or experiencing its symptoms.  That person may remain in the waiting area during the procedure.  Inpatient Visitation:    Visiting hours are 7 a.m. to 8 p.m. Inpatients will be allowed two visitors daily. The visitors may change each day during the patient's stay. No visitors under the age of 33. Any visitor under the age of 38 must be accompanied by an adult. The visitor must pass COVID-19 screenings, use hand sanitizer when entering and exiting the patient's room and wear a mask at all times, including in the patient's room. Patients must also wear a mask when staff or their visitor are in the room. Masking is required regardless of vaccination status.

## 2020-11-13 ENCOUNTER — Encounter: Payer: Self-pay | Admitting: Neurosurgery

## 2020-11-14 ENCOUNTER — Encounter: Payer: Self-pay | Admitting: Neurosurgery
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
## 2020-11-19 ENCOUNTER — Ambulatory Visit
Admission: RE | Admit: 2020-11-19 | Discharge: 2020-11-19 | Disposition: A | Discharge status: Home / Self Care | Payer: Medicare Other | Attending: Neurosurgery | Admitting: Neurosurgery

## 2020-11-19 ENCOUNTER — Ambulatory Visit: Payer: Medicare Other | Admitting: Urgent Care

## 2020-11-19 ENCOUNTER — Encounter
Admission: RE | Disposition: A | Discharge status: Home / Self Care | Payer: Self-pay | Source: Home / Self Care | Attending: Neurosurgery

## 2020-11-19 ENCOUNTER — Encounter: Payer: Self-pay | Admitting: Internal Medicine

## 2020-11-19 ENCOUNTER — Encounter: Payer: Self-pay | Admitting: Neurosurgery

## 2020-11-19 ENCOUNTER — Other Ambulatory Visit: Payer: Self-pay

## 2020-11-19 DIAGNOSIS — Z4549 Encounter for adjustment and management of other implanted nervous system device: Secondary | ICD-10-CM | POA: Insufficient documentation

## 2020-11-19 DIAGNOSIS — Z91030 Bee allergy status: Secondary | ICD-10-CM | POA: Insufficient documentation

## 2020-11-19 DIAGNOSIS — Z886 Allergy status to analgesic agent status: Secondary | ICD-10-CM | POA: Insufficient documentation

## 2020-11-19 DIAGNOSIS — Z21 Asymptomatic human immunodeficiency virus [HIV] infection status: Secondary | ICD-10-CM | POA: Insufficient documentation

## 2020-11-19 DIAGNOSIS — Z87891 Personal history of nicotine dependence: Secondary | ICD-10-CM | POA: Diagnosis not present

## 2020-11-19 DIAGNOSIS — Z79899 Other long term (current) drug therapy: Secondary | ICD-10-CM | POA: Diagnosis not present

## 2020-11-19 DIAGNOSIS — G894 Chronic pain syndrome: Secondary | ICD-10-CM | POA: Insufficient documentation

## 2020-11-19 DIAGNOSIS — Z96642 Presence of left artificial hip joint: Secondary | ICD-10-CM | POA: Insufficient documentation

## 2020-11-19 DIAGNOSIS — Z91013 Allergy to seafood: Secondary | ICD-10-CM | POA: Diagnosis not present

## 2020-11-19 DIAGNOSIS — Z880 Allergy status to penicillin: Secondary | ICD-10-CM | POA: Insufficient documentation

## 2020-11-19 DIAGNOSIS — Z885 Allergy status to narcotic agent status: Secondary | ICD-10-CM | POA: Diagnosis not present

## 2020-11-19 DIAGNOSIS — Z91018 Allergy to other foods: Secondary | ICD-10-CM | POA: Diagnosis not present

## 2020-11-19 HISTORY — DX: Atherosclerosis of aorta: I70.0

## 2020-11-19 HISTORY — DX: Thoracic aortic ectasia: I77.810

## 2020-11-19 HISTORY — DX: Late syphilis, latent: A52.8

## 2020-11-19 HISTORY — DX: Depression, unspecified: F32.A

## 2020-11-19 HISTORY — DX: Other ill-defined heart diseases: I51.89

## 2020-11-19 HISTORY — DX: Sleep apnea, unspecified: G47.30

## 2020-11-19 HISTORY — DX: Low back pain, unspecified: M54.50

## 2020-11-19 HISTORY — DX: Chronic obstructive pulmonary disease, unspecified: J44.9

## 2020-11-19 HISTORY — DX: Psoriasis, unspecified: L40.9

## 2020-11-19 HISTORY — DX: Male erectile dysfunction, unspecified: N52.9

## 2020-11-19 HISTORY — DX: Unspecified osteoarthritis, unspecified site: M19.90

## 2020-11-19 HISTORY — DX: Vitamin D deficiency, unspecified: E55.9

## 2020-11-19 HISTORY — DX: Neutropenia, unspecified: D70.9

## 2020-11-19 HISTORY — PX: SPINAL CORD STIMULATOR BATTERY EXCHANGE: SHX6202

## 2020-11-19 HISTORY — DX: Idiopathic aseptic necrosis of right femur: M87.052

## 2020-11-19 HISTORY — DX: Cannabis use, unspecified, in remission: F12.91

## 2020-11-19 HISTORY — DX: Personal history of other specified conditions: Z87.898

## 2020-11-19 HISTORY — DX: Anxiety disorder, unspecified: F41.9

## 2020-11-19 HISTORY — DX: Idiopathic aseptic necrosis of right femur: M87.051

## 2020-11-19 LAB — ABO/RH: ABO/RH(D): O POS

## 2020-11-19 SURGERY — SPINAL CORD STIMULATOR BATTERY EXCHANGE
Anesthesia: Monitor Anesthesia Care | Laterality: Right

## 2020-11-19 MED ORDER — CHLORHEXIDINE GLUCONATE 0.12 % MT SOLN: 15.0000 mL | Freq: Once | OROMUCOSAL | Status: AC

## 2020-11-19 MED ORDER — CEFAZOLIN SODIUM-DEXTROSE 2-4 GM/100ML-% IV SOLN
INTRAVENOUS | Status: AC
  Filled 2020-11-19: qty 100

## 2020-11-19 MED ORDER — FAMOTIDINE 20 MG PO TABS
ORAL_TABLET | ORAL | Status: AC
  Administered 2020-11-19: 20 mg via ORAL
  Filled 2020-11-19: qty 1

## 2020-11-19 MED ORDER — PROPOFOL 10 MG/ML IV BOLUS
INTRAVENOUS | Status: AC
  Filled 2020-11-19: qty 20

## 2020-11-19 MED ORDER — CHLORHEXIDINE GLUCONATE 0.12 % MT SOLN
OROMUCOSAL | Status: AC
  Administered 2020-11-19: 15 mL via OROMUCOSAL
  Filled 2020-11-19: qty 15

## 2020-11-19 MED ORDER — ONDANSETRON HCL 4 MG/2ML IJ SOLN: 4.0000 mg | Freq: Once | INTRAMUSCULAR | Status: DC | PRN

## 2020-11-19 MED ORDER — MIDAZOLAM HCL 2 MG/2ML IJ SOLN
INTRAMUSCULAR | Status: AC
  Filled 2020-11-19: qty 2

## 2020-11-19 MED ORDER — HYDRALAZINE HCL 20 MG/ML IJ SOLN
10.0000 mg | Freq: Once | INTRAMUSCULAR | Status: AC
  Administered 2020-11-19: 10 mg via INTRAVENOUS

## 2020-11-19 MED ORDER — PHENYLEPHRINE HCL (PRESSORS) 10 MG/ML IV SOLN
INTRAVENOUS | Status: AC
  Filled 2020-11-19: qty 1

## 2020-11-19 MED ORDER — BUPIVACAINE-EPINEPHRINE (PF) 0.5% -1:200000 IJ SOLN
INTRAMUSCULAR | Status: DC | PRN
  Administered 2020-11-19: 20 mL

## 2020-11-19 MED ORDER — LACTATED RINGERS IV SOLN: INTRAVENOUS | Status: DC

## 2020-11-19 MED ORDER — HYDROMORPHONE HCL 1 MG/ML IJ SOLN
INTRAMUSCULAR | Status: AC
  Administered 2020-11-19: 0.5 mg via INTRAVENOUS
  Filled 2020-11-19: qty 1

## 2020-11-19 MED ORDER — HYDROMORPHONE HCL 1 MG/ML IJ SOLN
0.5000 mg | INTRAMUSCULAR | Status: DC | PRN
  Administered 2020-11-19: 0.5 mg via INTRAVENOUS

## 2020-11-19 MED ORDER — 0.9 % SODIUM CHLORIDE (POUR BTL) OPTIME
TOPICAL | Status: DC | PRN
  Administered 2020-11-19: 1000 mL

## 2020-11-19 MED ORDER — TRAMADOL HCL 50 MG PO TABS: 50.0000 mg | ORAL_TABLET | Freq: Three times a day (TID) | ORAL | 0 refills | Status: AC | PRN

## 2020-11-19 MED ORDER — ORAL CARE MOUTH RINSE: 15.0000 mL | Freq: Once | OROMUCOSAL | Status: AC

## 2020-11-19 MED ORDER — HYDRALAZINE HCL 20 MG/ML IJ SOLN
INTRAMUSCULAR | Status: AC
  Filled 2020-11-19: qty 1

## 2020-11-19 MED ORDER — PROPOFOL 10 MG/ML IV BOLUS
INTRAVENOUS | Status: DC | PRN
  Administered 2020-11-19: 50 mg via INTRAVENOUS
  Administered 2020-11-19: 75 ug/kg/min via INTRAVENOUS
  Administered 2020-11-19: 50 mg via INTRAVENOUS
  Administered 2020-11-19: 40 mg via INTRAVENOUS
  Administered 2020-11-19: 20 mg via INTRAVENOUS

## 2020-11-19 MED ORDER — CEFAZOLIN SODIUM-DEXTROSE 2-4 GM/100ML-% IV SOLN
2.0000 g | INTRAVENOUS | Status: AC
  Administered 2020-11-19: 2 g via INTRAVENOUS

## 2020-11-19 MED ORDER — FAMOTIDINE 20 MG PO TABS: 20.0000 mg | ORAL_TABLET | Freq: Once | ORAL | Status: AC

## 2020-11-19 MED ORDER — LIDOCAINE HCL (PF) 2 % IJ SOLN
INTRAMUSCULAR | Status: AC
  Filled 2020-11-19: qty 5

## 2020-11-19 MED ORDER — LIDOCAINE HCL (CARDIAC) PF 100 MG/5ML IV SOSY
PREFILLED_SYRINGE | INTRAVENOUS | Status: DC | PRN
  Administered 2020-11-19: 60 mg via INTRAVENOUS

## 2020-11-19 MED ORDER — VANCOMYCIN HCL 1 G IV SOLR
INTRAVENOUS | Status: DC | PRN
  Administered 2020-11-19: 1000 mg via TOPICAL

## 2020-11-19 SURGICAL SUPPLY — 58 items
ADH SKN CLS APL DERMABOND .7 (GAUZE/BANDAGES/DRESSINGS)
APL PRP STRL LF DISP 70% ISPRP (MISCELLANEOUS) ×2
BUR NEURO DRILL SOFT 3.0X3.8M (BURR) ×2 IMPLANT
CHLORAPREP W/TINT 26 (MISCELLANEOUS) ×4 IMPLANT
COUNTER NEEDLE 20/40 LG (NEEDLE) ×2 IMPLANT
COVER LIGHT HANDLE STERIS (MISCELLANEOUS) ×4 IMPLANT
CUP MEDICINE 2OZ PLAST GRAD ST (MISCELLANEOUS) ×2 IMPLANT
DERMABOND ADVANCED (GAUZE/BANDAGES/DRESSINGS)
DERMABOND ADVANCED .7 DNX12 (GAUZE/BANDAGES/DRESSINGS) IMPLANT
DEVICE IMPLANT NEUROSTIMULATOR (Neuro Prosthesis/Implant) ×2 IMPLANT
DRAPE C-ARMOR (DRAPES) IMPLANT
DRAPE INCISE IOBAN 66X45 STRL (DRAPES) ×2 IMPLANT
DRAPE LAPAROTOMY 77X122 PED (DRAPES) ×2 IMPLANT
DRAPE SURG 17X11 SM STRL (DRAPES) ×2 IMPLANT
DRSG OPSITE POSTOP 4X6 (GAUZE/BANDAGES/DRESSINGS) ×2 IMPLANT
DRSG TEGADERM 4X4.75 (GAUZE/BANDAGES/DRESSINGS) IMPLANT
DRSG TELFA 4X3 1S NADH ST (GAUZE/BANDAGES/DRESSINGS) IMPLANT
ELECT CAUTERY BLADE TIP 2.5 (TIP) ×2
ELECT REM PT RETURN 9FT ADLT (ELECTROSURGICAL) ×2
ELECTRODE CAUTERY BLDE TIP 2.5 (TIP) ×1 IMPLANT
ELECTRODE REM PT RTRN 9FT ADLT (ELECTROSURGICAL) ×1 IMPLANT
FEE INTRAOP CADWELL SUPPLY NCS (MISCELLANEOUS) ×1 IMPLANT
FEE INTRAOP MONITOR IMPULS NCS (MISCELLANEOUS) IMPLANT
GAUZE 4X4 16PLY ~~LOC~~+RFID DBL (SPONGE) ×2 IMPLANT
GAUZE SPONGE 4X4 12PLY STRL (GAUZE/BANDAGES/DRESSINGS) ×2 IMPLANT
GLOVE SRG 8 PF TXTR STRL LF DI (GLOVE) ×1 IMPLANT
GLOVE SURG SYN 6.5 ES PF (GLOVE) ×4 IMPLANT
GLOVE SURG SYN 8.0 (GLOVE) ×2 IMPLANT
GLOVE SURG UNDER POLY LF SZ6.5 (GLOVE) ×4 IMPLANT
GLOVE SURG UNDER POLY LF SZ7 (GLOVE) ×2 IMPLANT
GLOVE SURG UNDER POLY LF SZ8 (GLOVE) ×2
GOWN SRG LRG LVL 4 IMPRV REINF (GOWNS) ×2 IMPLANT
GOWN STRL REIN LRG LVL4 (GOWNS) ×4
GOWN STRL REUS W/ TWL XL LVL3 (GOWN DISPOSABLE) ×2 IMPLANT
GOWN STRL REUS W/TWL XL LVL3 (GOWN DISPOSABLE) ×4
GRADUATE 1200CC STRL 31836 (MISCELLANEOUS) ×2 IMPLANT
INTRAOP CADWELL SUPPLY FEE NCS (MISCELLANEOUS) ×1
INTRAOP DISP SUPPLY FEE NCS (MISCELLANEOUS) ×2
INTRAOP MONITOR FEE IMPULS NCS (MISCELLANEOUS)
INTRAOP MONITOR FEE IMPULSE (MISCELLANEOUS)
KIT TURNOVER KIT A (KITS) ×2 IMPLANT
KIT WILSON FRAME (KITS) ×2 IMPLANT
MANIFOLD NEPTUNE II (INSTRUMENTS) ×2 IMPLANT
MARKER SKIN DUAL TIP RULER LAB (MISCELLANEOUS) ×2 IMPLANT
NEEDLE HYPO 22GX1.5 SAFETY (NEEDLE) ×2 IMPLANT
NS IRRIG 1000ML POUR BTL (IV SOLUTION) ×2 IMPLANT
PACK LAMINECTOMY NEURO (CUSTOM PROCEDURE TRAY) ×2 IMPLANT
PAD ARMBOARD 7.5X6 YLW CONV (MISCELLANEOUS) ×2 IMPLANT
RECHARGER INTELLIS (NEUROSURGERY SUPPLIES) ×2 IMPLANT
REPROGRAMMER INTELLIS (NEUROSURGERY SUPPLIES) ×2 IMPLANT
SUT ETHILON 3-0 FS-10 30 BLK (SUTURE)
SUT POLYSORB 2-0 5X18 GS-10 (SUTURE) ×6 IMPLANT
SUT SILK 2 0 PERMA HAND 18 BK (SUTURE) IMPLANT
SUT SILK 2 0 SH (SUTURE) ×2 IMPLANT
SUTURE EHLN 3-0 FS-10 30 BLK (SUTURE) IMPLANT
SYR BULB IRRIG 60ML STRL (SYRINGE) ×2 IMPLANT
TOWEL OR 17X26 4PK STRL BLUE (TOWEL DISPOSABLE) ×4 IMPLANT
WATER STERILE IRR 500ML POUR (IV SOLUTION) ×2 IMPLANT

## 2020-11-19 NOTE — Anesthesia Postprocedure Evaluation (Signed)
Anesthesia Post Note  Patient: Samuel Burnett  Procedure(s) Performed: REPLACEMENT PULSE GENERATOR RIGHT FLANK (MEDTRONIC) (Right)  Patient location during evaluation: PACU Anesthesia Type: General Level of consciousness: awake and alert, awake and oriented Pain management: pain level controlled Vital Signs Assessment: post-procedure vital signs reviewed and stable Respiratory status: spontaneous breathing, nonlabored ventilation and respiratory function stable Cardiovascular status: blood pressure returned to baseline and stable Postop Assessment: no apparent nausea or vomiting Anesthetic complications: no   No notable events documented.   Last Vitals:  Vitals:   11/19/20 0917 11/19/20 0927  BP: (!) 149/100 130/83  Pulse: 67 65  Resp: 20 18  Temp:    SpO2: 98% 100%    Last Pain:  Vitals:   11/19/20 0927  TempSrc:   PainSc: 8                  Phill Mutter
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
## 2020-11-19 NOTE — Op Note (Signed)
Operative Note   SURGERY DATE:  11/19/2020   PRE-OP DIAGNOSIS: Chronic Pain Syndrome (G89.4)   POST-OP DIAGNOSIS:  Chronic Pain Syndrome (G89.4)   Procedure(s) with comments: Replacement Right Flank Pulse Generator   SURGEON:     * Malen Gauze, MD      Cooper Render, PA Asst   ANESTHESIA: General    OPERATIVE FINDINGS: Failed Spinal Cord Stimulator Battery  Indications: Mr Catchings was seen in clinic on 8/23 and found to have inadequate coverage of back and leg pain with current spinal cord stimulator. The patient had good control of leg symptoms since it was placed. We discussed battery replacement only to control pain and return SCS to functioning status. The risks including hematoma, infection, damage to spinal cord stimulator wires, failure of relief of pain, and need for revision surgery were discussed. The patient elected to proceed with the battery exchange   Procedure:  The patient was brought to the operating room and placed prone on a standard OR table. The anesthesia service had vascular access and provided sedation medications to the patient.  The right flank incision was prepped and draped in a sterile fashion and the previous incision marked.  A hard timeout was performed.  Local anesthetic was instilled into the incision.    Incision was opened sharply to the subcutaneous layer.  The skin was then opened sharply and blunt dissection used to expose the battery and the width of this was exposed taking care not to damage the wires.  The battery was removed and the pocket and the electrodes removed from the battery.  Wires were seen to be intact without any obvious damage and the scar tissue was removed using blunt dissection.  The battery pocket was expanded to accommodate the new battery.  Hemostasis was obtained and the battery pocket was irrigated with saline.  Electrodes were attached to the new battery and interrogated. The impedences were found to be normal.   Electrodes were secured and tightened.  The battery was replaced into the pocket and secured with a 2-0 silk suture.  Vancomycin powder was placed into the pocket.   The deep and subcutaneous tissue was closed with 2-0 Vicryl.  The skin was closed with surgical adhesive.  A dressing was applied.  Patient was turned to the supine position and sedation medications were stopped and the patient was awoken from anesthesia.  Patient was taken to the PACU for further recovery and seen to be at neurologic baseline.  I discussed the results with the family      ESTIMATED BLOOD LOSS:   20 cc     IMPLANT DEVICE Lucillie Garfinkel [id number]  Inventory Item: DEVICE IMPLANT NEUROSTIMINATOR Serial no.: [id number] Model/Cat no.: [id number]  Implant name: DEVICE Lucillie Garfinkel [id number] H Laterality: Right Area: Back   Manufacturer: MEDTRONIC NEUROMOD PAIN MGMT Date of Manufacture:    Action: Implanted Number Used: 1   Device Identifier:  Device Identifier Type:         I performed the case in its entirety with assistance of PA, Ernestene Kiel, [place]

## 2020-11-19 NOTE — Interval H&P Note (Signed)
History and Physical Interval Note:  11/19/2020 6:38 AM  Samuel Burnett  has presented today for surgery, with the diagnosis of chronic pain syndrome g89.4.  The various methods of treatment have been discussed with the patient and family. After consideration of risks, benefits and other options for treatment, the patient has consented to  Procedure(s) with comments: REPLACEMENT PULSE GENERATOR RIGHT FLANK ([place]) (Right) - 1st case as a surgical intervention.  The patient's history has been reviewed, patient examined, no change in status, stable for surgery.  I have reviewed the patient's chart and labs.  Questions were answered to the patient's satisfaction.     Deetta Perla

## 2020-11-19 NOTE — Discharge Summary (Signed)
Physician Discharge Summary  Patient ID: Samuel Burnett MRN: [id number] DOB/AGE: 1960-04-06 60 y.o.  Admit date: 11/19/2020 Discharge date: 11/19/2020  Admission Diagnoses: Chronic pain syndrome   Discharge Diagnoses:  Active Problems:   * No active hospital problems. *   Discharged Condition: good  Hospital Course: Samuel Burnett is a 60 y.o presenting for replacement of SCS battery. His interoperative course was uncomplicated. Post-operative he was able to urinate, ambulate and tolerate PO intake. He was discharged home with PO medication for pain.   Consults: None  Significant Diagnostic Studies: none  Treatments: surgery: As above  Discharge Exam: Blood pressure (!) 123/96, pulse 70, temperature (!) 96.8 F (36 C), resp. rate 14, height 6' (1.829 m), weight 66.8 kg, SpO2 94 %. CN II-XII grossly intact Good and equal strength throughout BLE  Disposition: Discharge disposition: 01-Home or Self Care        Allergies as of 11/19/2020       Reactions   Fentanyl Shortness Of Breath, Nausea Only, Palpitations   Other reaction(s): Other (See Comments) sweating  Increasing temp., muscle weakness   Penicillin G Shortness Of Breath, Swelling   Other reaction(s): Difficulty breathing   Shellfish Allergy Other (See Comments)   Angioedema Angioedema   Tomato Other (See Comments)   Angioedema   Bee Pollen Itching   Grass Pollen(k-o-r-t-swt Vern)    Pollen Extract    Aspirin Nausea And Vomiting   Other reaction(s): Nausea And Vomiting   Dust Mite Extract Rash        Medication List     STOP taking these medications    oxyCODONE-acetaminophen 5-325 MG tablet Commonly known as: Percocet   sodium chloride 0.65 % Soln nasal spray Commonly known as: OCEAN       TAKE these medications    acetaminophen 650 MG CR tablet Commonly known as: TYLENOL Take 650 mg by mouth every 8 (eight) hours as needed for pain.   albuterol 108 (90 Base) MCG/ACT  inhaler Commonly known as: VENTOLIN HFA INHALE 1 TO 2 INHALATIONS  BY MOUTH INTO THE LUNGS  EVERY 6 HOURS AS NEEDED FOR WHEEZING OR SHORTNESS OF  BREATH What changed: See the new instructions.   aluminum chloride 20 % external solution Commonly known as: DRYSOL Apply topically at bedtime. What changed:  how much to take when to take this reasons to take this   amLODipine 2.5 MG tablet Commonly known as: NORVASC TAKE 1 TABLET BY MOUTH  DAILY What changed: how much to take   azelastine 0.1 % nasal spray Commonly known as: ASTELIN Place 2 sprays into both nostrils 2 (two) times daily. Use in each nostril as directed What changed:  when to take this reasons to take this   bictegravir-emtricitabine-tenofovir AF 50-200-25 MG Tabs tablet Commonly known as: BIKTARVY Take 1 tablet by mouth daily.   Blood Pressure Monitor Kit 1 kit by Does not apply route as directed.   Breo Ellipta 100-25 MCG/INH Aepb Generic drug: fluticasone furoate-vilanterol Inhale 1 puff into the lungs daily. Rinse mouth What changed:  when to take this reasons to take this   clotrimazole 1 % cream Commonly known as: LOTRIMIN APPLY TO AFFECTED AREA(S)  TOPICALLY TWICE DAILY What changed: See the new instructions.   DULoxetine 60 MG capsule Commonly known as: CYMBALTA TAKE 1 CAPSULE BY MOUTH  DAILY   ezetimibe 10 MG tablet Commonly known as: ZETIA Take 1 tablet (10 mg total) by mouth daily.   fluticasone 50 MCG/ACT nasal spray Commonly  known as: FLONASE SPRAY 2 SPRAYS INTO EACH NOSTRIL EVERY DAY AS NEEDED FOR ALLERGIES OR RHINITIS. USE NASAL SALINE 1ST What changed:  how much to take how to take this when to take this reasons to take this   hydrocortisone 2.5 % cream APPLY TOPICALLY TO FACE  TWICE DAILY What changed: See the new instructions.   Icy Hot 7.5 % (Roll) Misc Generic drug: Menthol (Topical Analgesic) Apply 1 each topically daily as needed (pain).   INSULIN SYRINGE 1CC/29G 29G  X 1/2" 1 ML Misc 1 each daily by Other route.   Lumify 0.025 % Soln Generic drug: Brimonidine Tartrate Place 1 drop into both eyes every other day.   montelukast 10 MG tablet Commonly known as: SINGULAIR Take 10 mg by mouth at bedtime.   multivitamin with minerals tablet Take 1 tablet by mouth daily. GNC MEGA MEN ESSENTIAL 50+   NON FORMULARY Prostaglandin injection 12 mg-63m-9mcg/mL injection   pravastatin 40 MG tablet Commonly known as: PRAVACHOL TAKE 1 TABLET BY MOUTH  DAILY AT NIGHT What changed: See the new instructions.   rOPINIRole 1 MG tablet Commonly known as: REQUIP TAKE 1 TABLET BY MOUTH AT  BEDTIME   traMADol 50 MG tablet Commonly known as: Ultram Take 1 tablet (50 mg total) by mouth 3 (three) times daily as needed for up to 3 days.   traZODone 50 MG tablet Commonly known as: DESYREL TAKE 1 TABLET BY MOUTH AT  BEDTIME AS NEEDED FOR SLEEP What changed:  when to take this additional instructions   triamcinolone cream 0.1 % Commonly known as: KENALOG Apply 1 application topically 2 (two) times daily. Prn eczema not face, underarms/groin What changed:  when to take this reasons to take this   trolamine salicylate 10 % cream Commonly known as: ASPERCREME Apply 1 application topically as needed for muscle pain.   Vitamin D3 Super Strength 50 MCG (2000 UT) Tabs Generic drug: Cholecalciferol Take 2 tablets (4,000 Units total) by mouth daily. What changed:  how much to take when to take this        Follow-up Information     KLoleta Dicker PA Follow up in 2 week(s).   Why: for incision check. [facility] Please call the office with any questions regarding appointment date and time. Contact information: [phone]                Signed: DLoleta Dicker9/02/2021, 8:21 AM

## 2020-11-19 NOTE — Transfer of Care (Signed)
Immediate Anesthesia Transfer of Care Note  Patient: Samuel Burnett  Procedure(s) Performed: REPLACEMENT PULSE GENERATOR RIGHT FLANK ([employer]) (Right)  Patient Location: PACU  Anesthesia Type:MAC  Level of Consciousness: awake, alert  and oriented  Airway & Oxygen Therapy: Patient Spontanous Breathing  Post-op Assessment: Report given to RN and Post -op Vital signs reviewed and stable  Post vital signs: Reviewed and stable  Last Vitals:  Vitals Value Taken Time  BP 135/86 11/19/20 0810  Temp    Pulse 76 11/19/20 0812  Resp 14 11/19/20 0812  SpO2 91 % 11/19/20 0812  Vitals shown include unvalidated device data.  Last Pain:  Vitals:   11/19/20 0643  TempSrc: Oral  PainSc: 0-No pain         Complications: No notable events documented.

## 2020-11-19 NOTE — Discharge Instructions (Addendum)
NEUROSURGERY DISCHARGE INSTRUCTIONS  Admission diagnosis: chronic pain syndrome g89.4  Operative procedure: Replacement of spinal cord stimulator pulse generator   What to do after you leave the hospital:  Recommended diet: regular diet. Increase protein intake to promote wound healing.  Recommended activity: activity as tolerated. No driving for 2 weeks.You should walk multiple times per day  Special Instructions  No straining, no heavy lifting > 10lbs x 4 weeks.  Keep incision area clean and dry. May shower in 2 days. No baths or pools for 6 weeks.  Please remove dressing tomorrow, no need to apply a bandage afterwards.  You have no sutures to remove, the skin is closed with adhesive  Please take pain medications as directed. Take a stool softener if on pain medications   Please Report any of the following: Nausea or Vomiting, Temperature is greater than 101.13F (38.1C) degrees, Dizziness, Abdominal Pain, Difficulty Breathing or Shortness of Breath, Inability to Eat, drink Fluids, or Take medications, Bleeding, swelling, or drainage from surgical incision sites, New numbness or weakness, and Bowel or bladder dysfunction to the neurosurgeon on call at [phone]  Additional Follow up appointments Please follow up with Cooper Render in [place] clinic as scheduled in 2-3 weeks   Please see below for scheduled appointments:  Future Appointments  Date Time Provider [place]  12/13/2020  1:15 PM [place] ADVISOR [facility]  01/25/2021  8:30 AM [place]ocuz[place], Nino Glow, MD [facility]     AMBULATORY SURGERY  DISCHARGE INSTRUCTIONS   The drugs that you were given will stay in your system until tomorrow so for the next 24 hours you should not:  Drive an automobile Make any legal decisions Drink any alcoholic beverage   You may resume regular meals tomorrow.  Today it is better to start with liquids and gradually work up to solid  foods.  You may eat anything you prefer, but it is better to start with liquids, then soup and crackers, and gradually work up to solid foods.   Please notify your doctor immediately if you have any unusual bleeding, trouble breathing, redness and pain at the surgery site, drainage, fever, or pain not relieved by medication.     Your post-operative visit with Dr.                                       is: Date:                        Time:    Please call to schedule your post-operative visit.  Additional Instructions:

## 2020-11-19 NOTE — Progress Notes (Signed)
PHARMACY -  BRIEF ANTIBIOTIC NOTE   Pharmacy has received consult(s) for Cefazolin from an OR provider.  The patient's profile has been reviewed for ht/wt/allergies/indication/available labs.    One time order(s) placed for Cefazolin 2 gm   Further antibiotics/pharmacy consults should be ordered by admitting physician if indicated.                       Thank you, Renda Rolls, PharmD, [facility] 11/19/2020 6:25 AM

## 2020-12-04 ENCOUNTER — Other Ambulatory Visit: Payer: Self-pay | Admitting: Internal Medicine

## 2020-12-05 ENCOUNTER — Encounter: Payer: Self-pay | Admitting: Internal Medicine

## 2020-12-13 ENCOUNTER — Ambulatory Visit: Payer: Medicare Other

## 2020-12-19 NOTE — Telephone Encounter (Signed)
Patient currently just went through hip replacement surgery and was trying to find his former urologist , I advised patient if he could not reach urology through My chart to cal there office . PCP currently out of office and may need appt for referral for new urologist but that  office should be able to see him even if his former urologist had moved from that office. Patient stated he had not had any issues or symptoms was just anything to wanted follow up advised to call the urology office that they should be able to help  or we could do a new referral in office at his appointment on 12/27/20

## 2020-12-27 DIAGNOSIS — Z23 Encounter for immunization: Secondary | ICD-10-CM | POA: Diagnosis not present

## 2020-12-27 DIAGNOSIS — Z87891 Personal history of nicotine dependence: Secondary | ICD-10-CM | POA: Diagnosis not present

## 2020-12-27 DIAGNOSIS — Z79899 Other long term (current) drug therapy: Secondary | ICD-10-CM | POA: Diagnosis not present

## 2021-01-11 ENCOUNTER — Telehealth: Payer: Self-pay

## 2021-01-11 ENCOUNTER — Ambulatory Visit: Payer: Medicare Other

## 2021-01-11 NOTE — Telephone Encounter (Signed)
Unsuccessful attempt to reach patient for scheduled AWV on preferred number listed in notes. Unable to leave a voicemail. Okay to reschedule.

## 2021-01-25 ENCOUNTER — Encounter: Payer: Self-pay | Admitting: Internal Medicine

## 2021-01-25 ENCOUNTER — Other Ambulatory Visit: Payer: Self-pay

## 2021-01-25 ENCOUNTER — Ambulatory Visit (INDEPENDENT_AMBULATORY_CARE_PROVIDER_SITE_OTHER): Payer: Medicare Other | Admitting: Internal Medicine

## 2021-01-25 VITALS — BP 126/80 | HR 85 | Temp 97.3°F | Ht 72.0 in | Wt 152.8 lb

## 2021-01-25 DIAGNOSIS — F4323 Adjustment disorder with mixed anxiety and depressed mood: Secondary | ICD-10-CM

## 2021-01-25 DIAGNOSIS — F4321 Adjustment disorder with depressed mood: Secondary | ICD-10-CM

## 2021-01-25 DIAGNOSIS — E119 Type 2 diabetes mellitus without complications: Secondary | ICD-10-CM | POA: Diagnosis not present

## 2021-01-25 DIAGNOSIS — J4520 Mild intermittent asthma, uncomplicated: Secondary | ICD-10-CM

## 2021-01-25 DIAGNOSIS — Z1211 Encounter for screening for malignant neoplasm of colon: Secondary | ICD-10-CM

## 2021-01-25 DIAGNOSIS — N529 Male erectile dysfunction, unspecified: Secondary | ICD-10-CM

## 2021-01-25 DIAGNOSIS — J45909 Unspecified asthma, uncomplicated: Secondary | ICD-10-CM | POA: Insufficient documentation

## 2021-01-25 LAB — POCT GLYCOSYLATED HEMOGLOBIN (HGB A1C): Hemoglobin A1C: 6.1 % — AB (ref 4.0–5.6)

## 2021-01-25 MED ORDER — DIAZEPAM 5 MG PO TABS: 5.0000 mg | ORAL_TABLET | Freq: Every day | ORAL | 0 refills | Status: DC | PRN

## 2021-01-25 MED ORDER — FLUTICASONE FUROATE-VILANTEROL 100-25 MCG/ACT IN AEPB: 1.0000 | INHALATION_SPRAY | Freq: Every day | RESPIRATORY_TRACT | 3 refills | Status: DC

## 2021-01-25 NOTE — Patient Instructions (Addendum)
Dr. Allen Norris   Primary Contact Information  Phone Fax E-mail Address  [phone] Not available [address]   [place]  [place] [address]   Sildenafil Tablets (Erectile Dysfunction) What is this medication? SILDENAFIL (sil DEN a fil) treats erectile dysfunction (ED). It works by increasing blood flow to the penis, which helps to maintain an erection. This medicine may be used for other purposes; ask your health care provider or pharmacist if you have questions. COMMON BRAND NAME(S): Viagra What should I tell my care team before I take this medication? They need to know if you have any of these conditions: Bleeding disorders Eye or vision problems, including a rare inherited eye disease called retinitis pigmentosa Anatomical deformation of the penis, Peyronie's disease, or history of priapism (painful and prolonged erection) Heart disease, angina, a history of heart attack, irregular heartbeats, or other heart problems High or low blood pressure History of blood diseases, like sickle cell anemia or leukemia History of stomach bleeding Kidney disease Liver disease Stroke An unusual or allergic reaction to sildenafil, other medications, foods, dyes, or preservatives Pregnant or trying to get pregnant Breast-feeding How should I use this medication? Take this medication by mouth with a glass of water. Follow the directions on the prescription label. The dose is usually taken 1 hour before sexual activity. You should not take the dose more than once per day. Do not take your medication more often than directed. Talk to your care team about the use of this medication in children. This medication is not used in children for this condition. Overdosage: If you think you have taken too much of this medicine contact a poison control center or emergency room at once. NOTE: This medicine is only for you. Do not share this medicine with others. What if I miss a dose? This does not apply.  Do not take double or extra doses. What may interact with this medication? Do not take this medication with any of the following: Cisapride Nitrates like amyl nitrite, isosorbide dinitrate, isosorbide mononitrate, nitroglycerin Riociguat This medication may also interact with the following: Antiviral medications for HIV or AIDS Bosentan Certain medications for benign prostatic hyperplasia (BPH) Certain medications for blood pressure Certain medications for fungal infections like ketoconazole and itraconazole Cimetidine Erythromycin Rifampin This list may not describe all possible interactions. Give your health care provider a list of all the medicines, herbs, non-prescription drugs, or dietary supplements you use. Also tell them if you smoke, drink alcohol, or use illegal drugs. Some items may interact with your medicine. What should I watch for while using this medication? If you notice any changes in your vision while taking this medication, call your care team as soon as possible. Stop using this medication and call your care team right away if you have a loss of sight in one or both eyes. Contact your care team right away if you have an erection that lasts longer than 4 hours or if it becomes painful. This may be a sign of a serious problem and must be treated right away to prevent permanent damage. If you experience symptoms of nausea, dizziness, chest pain or arm pain upon initiation of sexual activity after taking this medication, you should refrain from further activity and call your care team as soon as possible. Do not drink alcohol to excess (examples, 5 glasses of wine or 5 shots of whiskey) when taking this medication. When taken in excess, alcohol can increase your chances of getting a headache or getting  dizzy, increasing your heart rate or lowering your blood pressure. Using this medication does not protect you or your partner against HIV infection (the virus that causes AIDS) or  other sexually transmitted diseases. What side effects may I notice from receiving this medication? Side effects that you should report to your care team as soon as possible: Allergic reactions--skin rash, itching, hives, swelling of the face, lips, tongue, or throat Hearing loss or ringing in ears Heart attack--pain or tightness in the chest, shoulders, arms, or jaw, nausea, shortness of breath, cold or clammy skin, feeling faint or lightheaded Heart rhythm changes--fast or irregular heartbeat, dizziness, feeling faint or lightheaded, chest pain, trouble breathing Low blood pressure--dizziness, feeling faint or lightheaded, blurry vision New or worsening shortness of breath Prolonged or painful erection Stroke--sudden numbness or weakness of the face, arm, or leg, trouble speaking, confusion, trouble walking, loss of balance or coordination, dizziness, severe headache, change in vision Sudden vision loss in one or both eyes Side effects that usually do not require medical attention (report to your care team if they continue or are bothersome): Facial flushing or redness Headache Nosebleed Runny or stuffy nose Trouble sleeping Upset stomach This list may not describe all possible side effects. Call your doctor for medical advice about side effects. You may report side effects to FDA at 1-800-FDA-1088. Where should I keep my medication? Keep out of reach of children and pets. Store at room temperature between 15 and 30 degrees C (59 and 86 degrees F). Throw away any unused medication after the expiration date. NOTE: This sheet is a summary. It may not cover all possible information. If you have questions about this medicine, talk to your doctor, pharmacist, or health care provider.  2022 Elsevier/Gold Standard (2020-04-20 00:00:00)
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
## 2021-01-28 DIAGNOSIS — F4321 Adjustment disorder with depressed mood: Secondary | ICD-10-CM

## 2021-01-28 HISTORY — DX: Adjustment disorder with depressed mood: F43.21

## 2021-01-29 ENCOUNTER — Other Ambulatory Visit: Payer: Self-pay

## 2021-01-29 NOTE — Progress Notes (Signed)
MADE APPOINTMENT TO BE SEEN BEFORE A COLONOSCOPY IS SCHEDULED HE HAS A TENS UNIT SURGICAL  IMPLANTED

## 2021-02-12 ENCOUNTER — Other Ambulatory Visit: Payer: Medicare Other | Admitting: Gastroenterology

## 2021-02-14 ENCOUNTER — Encounter: Payer: Self-pay | Admitting: Nurse Practitioner

## 2021-02-14 ENCOUNTER — Other Ambulatory Visit: Payer: Self-pay

## 2021-02-14 ENCOUNTER — Telehealth (INDEPENDENT_AMBULATORY_CARE_PROVIDER_SITE_OTHER): Payer: Medicare Other | Admitting: Nurse Practitioner

## 2021-02-14 DIAGNOSIS — J4520 Mild intermittent asthma, uncomplicated: Secondary | ICD-10-CM

## 2021-02-14 DIAGNOSIS — J069 Acute upper respiratory infection, unspecified: Secondary | ICD-10-CM | POA: Diagnosis not present

## 2021-02-14 HISTORY — DX: Acute upper respiratory infection, unspecified: J06.9

## 2021-02-14 MED ORDER — AZITHROMYCIN 250 MG PO TABS: ORAL_TABLET | ORAL | 0 refills | Status: AC

## 2021-02-14 NOTE — Assessment & Plan Note (Signed)
Given the patient is asthmatic and length of time of symptoms will elect to treat with azithromycin 250 mg.  Did discuss this with patient he can continue using his albuterol inhaler as needed.  We did discuss steroids with patient was not interested.  Did discuss signs and symptoms when to seek urgent or emergent health care.  Patient acknowledged.

## 2021-02-14 NOTE — Progress Notes (Signed)
Patient ID: Samuel Burnett, male    DOB: [birth year], 60 y.o.   MRN: [id number]  Virtual visit completed through [employer], a video enabled telemedicine application. Due to national recommendations of social distancing due to COVID-19, a virtual visit is felt to be most appropriate for this patient at this time. Reviewed limitations, risks, security and privacy concerns of performing a virtual visit and the availability of in person appointments. I also reviewed that there may be a patient responsible charge related to this service. The patient agreed to proceed.   Patient location: home Provider location: [place] at [facility], office Persons participating in this virtual visit: patient, provider   If any vitals were documented, they were collected by patient at home unless specified below.    There were no vitals taken for this visit.   CC: Cough Subjective:   HPI: Samuel Burnett is a 60 y.o. male presenting on 02/14/2021 for Cough (Sx started on 01/31/21-felt like cold symptoms for a week, had fever last week-pt not sure of the numbers, this week-coughing up grey phlegm, wheezing when lays down. Has been taking Mucinnex, Alkal setzer plus, Albuterol inhaler.)   Symptoms started at thanksgiving  No covid test Few times daily for wheezing Thera flu, mucinnex with some help Symptoms have improved some but still dealing with the productive cough  Relevant past medical, surgical, family and social history reviewed and updated as indicated. Interim medical history since our last visit reviewed. Allergies and medications reviewed and updated. Outpatient Medications Prior to Visit  Medication Sig Dispense Refill   albuterol (VENTOLIN HFA) 108 (90 Base) MCG/ACT inhaler INHALE 1 TO 2 INHALATIONS  BY MOUTH INTO THE LUNGS  EVERY 6 HOURS AS NEEDED FOR WHEEZING OR SHORTNESS OF  BREATH (Patient taking differently: Inhale 1-2 puffs into the lungs every 6 (six) hours as needed.) 34 g 3    amLODipine (NORVASC) 2.5 MG tablet TAKE 1 TABLET BY MOUTH  DAILY (Patient taking differently: Take 1.25 mg by mouth daily.) 90 tablet 3   azelastine (ASTELIN) 0.1 % nasal spray Place 2 sprays into both nostrils 2 (two) times daily. Use in each nostril as directed (Patient taking differently: Place 2 sprays into both nostrils 2 (two) times daily as needed for rhinitis or allergies. Use in each nostril as directed) 90 mL 2   bictegravir-emtricitabine-tenofovir AF (BIKTARVY) 50-200-25 MG TABS tablet Take 1 tablet by mouth daily.     Blood Pressure Monitor KIT 1 kit by Does not apply route as directed. 1 each 0   Brimonidine Tartrate (LUMIFY) 0.025 % SOLN Place 1 drop into both eyes every other day.     Cholecalciferol (VITAMIN D3 SUPER STRENGTH) 50 MCG (2000 UT) TABS Take 2 tablets (4,000 Units total) by mouth daily. (Patient taking differently: Take 3 tablets by mouth every other day.) 180 tablet 3   clotrimazole (LOTRIMIN) 1 % cream APPLY TO AFFECTED AREA(S)  TOPICALLY TWICE DAILY (Patient taking differently: Apply 1 application topically 2 (two) times daily as needed (irritation).) 90 g 4   DULoxetine (CYMBALTA) 60 MG capsule TAKE 1 CAPSULE BY MOUTH  DAILY (Patient taking differently: Take 60 mg by mouth daily.) 90 capsule 3   fluticasone furoate-vilanterol (BREO ELLIPTA) 100-25 MCG/ACT AEPB Inhale 1 puff into the lungs daily. 180 each 3   hydrocortisone 2.5 % cream APPLY TOPICALLY TO FACE  TWICE DAILY (Patient taking differently: Apply 1 application topically 2 (two) times daily as needed (apply to face as needed).) 90 g  11   INSULIN SYRINGE 1CC/29G 29G X 1/2" 1 ML MISC 1 each daily by Other route.     montelukast (SINGULAIR) 10 MG tablet TAKE 1 TABLET BY MOUTH  DAILY AT NIGHT 90 tablet 3   Multiple Vitamins-Minerals (MULTIVITAMIN WITH MINERALS) tablet Take 1 tablet by mouth daily. [employer] MEGA MEN ESSENTIAL 50+     pravastatin (PRAVACHOL) 40 MG tablet TAKE 1 TABLET BY MOUTH  DAILY AT NIGHT (Patient taking  differently: Take 40 mg by mouth at bedtime.) 90 tablet 3   rOPINIRole (REQUIP) 1 MG tablet TAKE 1 TABLET BY MOUTH AT  BEDTIME (Patient taking differently: Take 1 mg by mouth at bedtime.) 90 tablet 3   traZODone (DESYREL) 50 MG tablet TAKE 1 TABLET BY MOUTH AT  BEDTIME AS NEEDED FOR SLEEP (Patient taking differently: Take 50 mg by mouth at bedtime.) 90 tablet 3   triamcinolone cream (KENALOG) 0.1 % Apply 1 application topically 2 (two) times daily. Prn eczema not face, underarms/groin (Patient taking differently: Apply 1 application topically 2 (two) times daily as needed (eczema). Prn eczema not face, underarms/groin) 454 g 11   aluminum chloride (DRYSOL) 20 % external solution Apply topically at bedtime. (Patient taking differently: Apply 1 application topically at bedtime as needed (irritation).) 60 mL 11   diazepam (VALIUM) 5 MG tablet Take 1 tablet (5 mg total) by mouth daily as needed for anxiety. 20 tablet 0   fluticasone (FLONASE) 50 MCG/ACT nasal spray SPRAY 2 SPRAYS INTO EACH NOSTRIL EVERY DAY AS NEEDED FOR ALLERGIES OR RHINITIS. USE NASAL SALINE 1ST (Patient taking differently: Place 2 sprays into both nostrils daily as needed for allergies. SPRAY 2 SPRAYS INTO EACH NOSTRIL EVERY DAY AS NEEDED FOR ALLERGIES OR RHINITIS. USE NASAL SALINE 1ST) 48 mL 11   Menthol, Topical Analgesic, (ICY HOT) 7.5 % (Roll) MISC Apply 1 each topically daily as needed (pain).     NON FORMULARY Prostaglandin injection 12 mg-85m-9mcg/mL injection     trolamine salicylate (ASPERCREME) 10 % cream Apply 1 application topically as needed for muscle pain.     No facility-administered medications prior to visit.     Per HPI unless specifically indicated in ROS section below Review of Systems  Constitutional:  Negative for chills and fever.  HENT:  Negative for congestion, ear discharge, ear pain and sore throat.   Respiratory:  Positive for cough (cream color) and wheezing. Negative for shortness of breath.    Cardiovascular:  Negative for chest pain.  Gastrointestinal:  Negative for abdominal pain, diarrhea, nausea and vomiting.  Musculoskeletal:  Negative for arthralgias and myalgias.  Neurological:  Negative for headaches.  Objective:  There were no vitals taken for this visit.  Wt Readings from Last 3 Encounters:  01/25/21 152 lb 12.8 oz (69.3 kg)  11/19/20 147 lb 4.3 oz (66.8 kg)  11/09/20 147 lb 4.3 oz (66.8 kg)       Physical exam: Gen: alert, NAD, not ill appearing Pulm: speaks in complete sentences without increased work of breathing Psych: normal mood, normal thought content      Results for orders placed or performed in visit on 01/25/21  POCT HgB A1C  Result Value Ref Range   Hemoglobin A1C 6.1 (A) 4.0 - 5.6 %   HbA1c POC (<> result, manual entry)     HbA1c, POC (prediabetic range)     HbA1c, POC (controlled diabetic range)     Assessment & Plan:   Problem List Items Addressed This Visit  Respiratory   Asthma    Currently maintained on albuterol inhaler and Breo Ellipta, and multivitamins.  Continue using medications as prescribed.  When images steroids patient was reluctant and shaking his head against the idea.      Upper respiratory tract infection - Primary    Given the patient is asthmatic and length of time of symptoms will elect to treat with azithromycin 250 mg.  Did discuss this with patient he can continue using his albuterol inhaler as needed.  We did discuss steroids with patient was not interested.  Did discuss signs and symptoms when to seek urgent or emergent health care.  Patient acknowledged.      Relevant Medications   azithromycin (ZITHROMAX) 250 MG tablet     No orders of the defined types were placed in this encounter.  No orders of the defined types were placed in this encounter.   I discussed the assessment and treatment plan with the patient. The patient was provided an opportunity to ask questions and all were answered. The  patient agreed with the plan and demonstrated an understanding of the instructions. The patient was advised to call back or seek an in-person evaluation if the symptoms worsen or if the condition fails to improve as anticipated.  Follow up plan: No follow-ups on file.  Romilda Garret, NP

## 2021-02-14 NOTE — Assessment & Plan Note (Signed)
Currently maintained on albuterol inhaler and Breo Ellipta, and multivitamins.  Continue using medications as prescribed.  When images steroids patient was reluctant and shaking his head against the idea.

## 2021-04-04 ENCOUNTER — Other Ambulatory Visit: Payer: Self-pay | Admitting: Internal Medicine

## 2021-04-04 DIAGNOSIS — I10 Essential (primary) hypertension: Secondary | ICD-10-CM

## 2021-04-04 DIAGNOSIS — I252 Old myocardial infarction: Secondary | ICD-10-CM

## 2021-04-04 DIAGNOSIS — G2581 Restless legs syndrome: Secondary | ICD-10-CM

## 2021-04-04 DIAGNOSIS — G8929 Other chronic pain: Secondary | ICD-10-CM

## 2021-04-04 DIAGNOSIS — E782 Mixed hyperlipidemia: Secondary | ICD-10-CM

## 2021-04-04 DIAGNOSIS — G4700 Insomnia, unspecified: Secondary | ICD-10-CM

## 2021-04-09 ENCOUNTER — Other Ambulatory Visit: Payer: Self-pay | Admitting: *Deleted

## 2021-04-09 MED ORDER — EZETIMIBE 10 MG PO TABS: 10.0000 mg | ORAL_TABLET | Freq: Every day | ORAL | 0 refills | Status: DC

## 2021-04-22 DIAGNOSIS — N5203 Combined arterial insufficiency and corporo-venous occlusive erectile dysfunction: Secondary | ICD-10-CM | POA: Insufficient documentation

## 2021-05-15 ENCOUNTER — Other Ambulatory Visit: Payer: Self-pay

## 2021-05-15 ENCOUNTER — Encounter: Payer: Self-pay | Admitting: Internal Medicine

## 2021-05-15 ENCOUNTER — Ambulatory Visit (INDEPENDENT_AMBULATORY_CARE_PROVIDER_SITE_OTHER): Payer: Medicare Other | Admitting: Internal Medicine

## 2021-05-15 ENCOUNTER — Telehealth: Payer: Self-pay | Admitting: Internal Medicine

## 2021-05-15 VITALS — BP 120/70 | HR 84 | Temp 98.1°F | Ht 72.0 in | Wt 151.4 lb

## 2021-05-15 DIAGNOSIS — E782 Mixed hyperlipidemia: Secondary | ICD-10-CM

## 2021-05-15 DIAGNOSIS — I10 Essential (primary) hypertension: Secondary | ICD-10-CM | POA: Diagnosis not present

## 2021-05-15 DIAGNOSIS — D72819 Decreased white blood cell count, unspecified: Secondary | ICD-10-CM

## 2021-05-15 DIAGNOSIS — S60450A Superficial foreign body of right index finger, initial encounter: Secondary | ICD-10-CM | POA: Diagnosis not present

## 2021-05-15 DIAGNOSIS — R7303 Prediabetes: Secondary | ICD-10-CM

## 2021-05-15 DIAGNOSIS — D72820 Lymphocytosis (symptomatic): Secondary | ICD-10-CM | POA: Diagnosis not present

## 2021-05-15 LAB — CBC WITH DIFFERENTIAL/PLATELET
Basophils Absolute: 0 10*3/uL (ref 0.0–0.1)
Basophils Relative: 1.2 % (ref 0.0–3.0)
Eosinophils Absolute: 0.1 10*3/uL (ref 0.0–0.7)
Eosinophils Relative: 4.1 % (ref 0.0–5.0)
HCT: 46.3 % (ref 39.0–52.0)
Hemoglobin: 15.2 g/dL (ref 13.0–17.0)
Lymphocytes Relative: 54.4 % — ABNORMAL HIGH (ref 12.0–46.0)
Lymphs Abs: 2 10*3/uL (ref 0.7–4.0)
MCHC: 33 g/dL (ref 30.0–36.0)
MCV: 99.6 fl (ref 78.0–100.0)
Monocytes Absolute: 0.4 10*3/uL (ref 0.1–1.0)
Monocytes Relative: 10.9 % (ref 3.0–12.0)
Neutro Abs: 1.1 10*3/uL — ABNORMAL LOW (ref 1.4–7.7)
Neutrophils Relative %: 29.4 % — ABNORMAL LOW (ref 43.0–77.0)
Platelets: 203 10*3/uL (ref 150.0–400.0)
RBC: 4.64 Mil/uL (ref 4.22–5.81)
RDW: 13.5 % (ref 11.5–15.5)
WBC: 3.6 10*3/uL — ABNORMAL LOW (ref 4.0–10.5)

## 2021-05-15 LAB — COMPREHENSIVE METABOLIC PANEL
ALT: 14 U/L (ref 0–53)
AST: 21 U/L (ref 0–37)
Albumin: 4.4 g/dL (ref 3.5–5.2)
Alkaline Phosphatase: 86 U/L (ref 39–117)
BUN: 9 mg/dL (ref 6–23)
CO2: 29 mEq/L (ref 19–32)
Calcium: 9.8 mg/dL (ref 8.4–10.5)
Chloride: 105 mEq/L (ref 96–112)
Creatinine, Ser: 1.03 mg/dL (ref 0.40–1.50)
GFR: 77.73 mL/min (ref >60.00)
Glucose, Bld: 96 mg/dL (ref 70–99)
Potassium: 4.4 mEq/L (ref 3.5–5.1)
Sodium: 141 mEq/L (ref 135–145)
Total Bilirubin: 0.6 mg/dL (ref 0.2–1.2)
Total Protein: 6.8 g/dL (ref 6.0–8.3)

## 2021-05-15 LAB — HEMOGLOBIN A1C: Hgb A1c MFr Bld: 5.9 % (ref 4.6–6.5)

## 2021-05-15 LAB — LIPID PANEL
Cholesterol: 173 mg/dL (ref 0–200)
HDL: 55.6 mg/dL (ref >39.00)
LDL Cholesterol: 97 mg/dL (ref 0–99)
NonHDL: 117.48
Total CHOL/HDL Ratio: 3
Triglycerides: 100 mg/dL (ref 0.0–149.0)
VLDL: 20 mg/dL (ref 0.0–40.0)

## 2021-05-15 MED ORDER — MUPIROCIN 2 % EX OINT: 1.0000 "application " | TOPICAL_OINTMENT | Freq: Two times a day (BID) | CUTANEOUS | 0 refills | Status: DC

## 2021-05-15 NOTE — Telephone Encounter (Signed)
Lft pt vm with appt time date and location and number to call if he needs to resch. Thank you! ?

## 2021-05-15 NOTE — Patient Instructions (Addendum)
Due for annual physical  ?07/24/21  ?Use ointment 2x per day ? ?Paronychia ?Paronychia is an infection of the skin that surrounds a nail. It usually affects the skin around a fingernail, but it may also occur near a toenail. It often causes pain and swelling around the nail. In some cases, a collection of pus (abscess) can form near or under the nail.  ?This condition may develop suddenly, or it may develop gradually over a longer period. In most cases, paronychia is not serious, and it will clear up with treatment. ?What are the causes? ?This condition may be caused by bacteria or a fungus, such as yeast. The bacteria or fungus can enter the body through an opening in the skin, such as a cut or a hangnail, and cause an infection in your fingernail or toenail. Other causes may include: ?Recurrent injury to the fingernail or toenail area. ?Irritation of the base and sides of the nail (cuticle). ?Injury and irritation can result in inflammation, swelling, and thickened skin around the nail. ?What increases the risk? ?This condition is more likely to develop in people who: ?Get their hands wet often, such as those who work as Designer, industrial/product, bartenders, or housekeepers. ?Bite their fingernails or cuticles. ?Have underlying skin conditions. ?Have hangnails or injured fingertips. ?Are exposed to irritants like detergents and other chemicals. ?Have diabetes. ?What are the signs or symptoms? ?Symptoms of this condition include: ?Redness and swelling of the skin near the nail. ?Tenderness around the nail when you touch the area. ?Pus-filled bumps under the cuticle. ?Fluid or pus under the nail. ?Throbbing pain in the area. ?How is this diagnosed? ?This condition is diagnosed with a physical exam. In some cases, a sample of pus may be tested to determine what type of bacteria or fungus is causing the condition. ?How is this treated? ?Treatment depends on the cause and severity of your condition. If your condition is mild, it may  clear up on its own in a few days or after soaking in warm water. If needed, treatment may include: ?Antibiotic medicine, if your infection is caused by bacteria. ?Antifungal medicine, if your infection is caused by a fungus. ?A procedure to drain pus from an abscess. ?Anti-inflammatory medicine (corticosteroids). ?Removal of part of an ingrown toenail. ?A bandage (dressing) may be placed over the affected area if an abscess or part of a nail has been removed. ?Follow these instructions at home: ?Wound care ?Keep the affected area clean. ?Soak the affected area in warm water if told to do so by your health care provider. You may be told to do this for 20 minutes, 2-3 times a day. ?Keep the area dry when you are not soaking it. ?Do not try to drain an abscess yourself. ?Follow instructions from your health care provider about how to take care of the affected area. Make sure you: ?Wash your hands with soap and water for at least 20 seconds before and after you change your dressing. If soap and water are not available, use hand sanitizer. ?Change your dressing as told by your health care provider. ?If you had an abscess drained, check the area every day for signs of infection. Check for: ?Redness, swelling, or pain. ?Fluid or blood. ?Warmth. ?Pus or a bad smell. ?Medicines ? ?Take over-the-counter and prescription medicines only as told by your health care provider. ?If you were prescribed an antibiotic medicine, take it as told by your health care provider. Do not stop taking the antibiotic even if you start  to feel better. ?General instructions ?Avoid contact with any skin irritants or allergens. ?Do not pick at the affected area. ?Keep all follow-up visits as told. This is important. ?Prevention ?To prevent this condition from happening again: ?Wear rubber gloves when washing dishes or doing other tasks that require your hands to get wet. ?Wear gloves if your hands might come in contact with cleaners or other  chemicals. ?Avoid injuring your nails or fingertips. ?Do not bite your nails or tear hangnails. ?Do not cut your nails very short. ?Do not cut your cuticles. ?Use clean nail clippers or scissors when trimming nails. ?Contact a health care provider if: ?Your symptoms get worse or do not improve with treatment. ?You have continued or increased fluid, blood, or pus coming from the affected area. ?Your affected finger, toe, or joint becomes swollen or difficult to move. ?You have a fever or chills. ?There is redness spreading away from the affected area. ?Summary ?Paronychia is an infection of the skin that surrounds a nail. It often causes pain and swelling around the nail. In some cases, a collection of pus (abscess) can form near or under the nail. ?This condition may be caused by bacteria or a fungus. These germs can enter the body through an opening in the skin, such as a cut or a hangnail. ?If your condition is mild, it may clear up on its own in a few days. If needed, treatment may include medicine or a procedure to drain pus from an abscess. ?To prevent this condition from happening again, wear gloves if doing tasks that require your hands to get wet or to come in contact with chemicals. Also avoid injuring your nails or fingertips. ?This information is not intended to replace advice given to you by your health care provider. Make sure you discuss any questions you have with your health care provider. ?Document Revised: 05/28/2020 Document Reviewed: 05/28/2020 ?Elsevier Patient Education ? [employer]. ? ?
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
## 2021-05-16 DIAGNOSIS — L03011 Cellulitis of right finger: Secondary | ICD-10-CM | POA: Diagnosis not present

## 2021-05-21 DIAGNOSIS — D72820 Lymphocytosis (symptomatic): Secondary | ICD-10-CM

## 2021-05-21 DIAGNOSIS — D72819 Decreased white blood cell count, unspecified: Secondary | ICD-10-CM | POA: Insufficient documentation

## 2021-05-21 HISTORY — DX: Lymphocytosis (symptomatic): D72.820

## 2021-05-21 NOTE — Addendum Note (Signed)
Addended by: Orland Mustard on: 05/21/2021 02:26 PM ? ? Modules accepted: Orders ? ?

## 2021-05-23 ENCOUNTER — Other Ambulatory Visit: Payer: Self-pay

## 2021-05-23 MED ORDER — PEG 3350-KCL-NA BICARB-NACL 420 G PO SOLR: 4000.0000 mL | Freq: Once | ORAL | 0 refills | Status: AC

## 2021-05-23 NOTE — Progress Notes (Signed)
Gastroenterology Pre-Procedure Review ? ?Request Date: 06/18/2021 ?Requesting Physician: Dr. Allen Norris ? ?PATIENT REVIEW QUESTIONS: The patient responded to the following health history questions as indicated:  5 year recall ? ?1. Are you having any GI issues? no ?2. Do you have a personal history of Polyps? yes (11/2014 polyps removed.) ?3. Do you have a family history of Colon Cancer or Polyps? no ?4. Diabetes Mellitus? no ?5. Joint replacements in the past 12 months?no ?6. Major health problems in the past 3 months? Spinal cord stimulator. Medical clearance will be sent. ?7. Any artificial heart valves, MVP, or defibrillator?no ?   ?MEDICATIONS & ALLERGIES:    ?Patient reports the following regarding taking any anticoagulation/antiplatelet therapy:   ?Plavix, Coumadin, Eliquis, Xarelto, Lovenox, Pradaxa, Brilinta, or Effient? no ?Aspirin? no ? ?Patient confirms/reports the following medications:  ?Current Outpatient Medications  ?Medication Sig Dispense Refill  ? albuterol (VENTOLIN HFA) 108 (90 Base) MCG/ACT inhaler INHALE 1 TO 2 INHALATIONS  BY MOUTH INTO THE LUNGS  EVERY 6 HOURS AS NEEDED FOR WHEEZING OR SHORTNESS OF  BREATH (Patient taking differently: Inhale 1-2 puffs into the lungs every 6 (six) hours as needed.) 34 g 3  ? amLODipine (NORVASC) 2.5 MG tablet TAKE 1 TABLET BY MOUTH  DAILY 90 tablet 3  ? azelastine (ASTELIN) 0.1 % nasal spray Place 2 sprays into both nostrils 2 (two) times daily. Use in each nostril as directed (Patient taking differently: Place 2 sprays into both nostrils 2 (two) times daily as needed for rhinitis or allergies. Use in each nostril as directed) 90 mL 2  ? bictegravir-emtricitabine-tenofovir AF (BIKTARVY) 50-200-25 MG TABS tablet Take 1 tablet by mouth daily.    ? Blood Pressure Monitor KIT 1 kit by Does not apply route as directed. 1 each 0  ? Brimonidine Tartrate (LUMIFY) 0.025 % SOLN Place 1 drop into both eyes every other day.    ? Cholecalciferol (VITAMIN D3 SUPER STRENGTH) 50  MCG (2000 UT) TABS Take 2 tablets (4,000 Units total) by mouth daily. (Patient taking differently: Take 3 tablets by mouth every other day.) 180 tablet 3  ? clotrimazole (LOTRIMIN) 1 % cream APPLY TO AFFECTED AREA(S)  TOPICALLY TWICE DAILY (Patient taking differently: Apply 1 application. topically 2 (two) times daily as needed (irritation).) 90 g 4  ? DULoxetine (CYMBALTA) 60 MG capsule TAKE 1 CAPSULE BY MOUTH  DAILY 90 capsule 3  ? ezetimibe (ZETIA) 10 MG tablet Take 1 tablet (10 mg total) by mouth daily. 90 tablet 0  ? fluticasone furoate-vilanterol (BREO ELLIPTA) 100-25 MCG/ACT AEPB Inhale 1 puff into the lungs daily. 180 each 3  ? hydrocortisone 2.5 % cream APPLY TOPICALLY TO FACE  TWICE DAILY (Patient taking differently: Apply 1 application. topically 2 (two) times daily as needed (apply to face as needed).) 90 g 11  ? INSULIN SYRINGE 1CC/29G 29G X 1/2" 1 ML MISC 1 each daily by Other route.    ? montelukast (SINGULAIR) 10 MG tablet TAKE 1 TABLET BY MOUTH  DAILY AT NIGHT 90 tablet 3  ? Multiple Vitamins-Minerals (MULTIVITAMIN WITH MINERALS) tablet Take 1 tablet by mouth daily. GNC MEGA MEN ESSENTIAL 50+    ? mupirocin ointment (BACTROBAN) 2 % Apply 1 application. topically 2 (two) times daily. Right index finger 30 g 0  ? pravastatin (PRAVACHOL) 40 MG tablet TAKE 1 TABLET BY MOUTH  DAILY AT NIGHT 90 tablet 3  ? rOPINIRole (REQUIP) 1 MG tablet TAKE 1 TABLET BY MOUTH AT  BEDTIME 90 tablet 3  ?  traZODone (DESYREL) 50 MG tablet TAKE 1 TABLET BY MOUTH AT  BEDTIME AS NEEDED FOR SLEEP 90 tablet 3  ? triamcinolone cream (KENALOG) 0.1 % Apply 1 application topically 2 (two) times daily. Prn eczema not face, underarms/groin (Patient taking differently: Apply 1 application. topically 2 (two) times daily as needed (eczema). Prn eczema not face, underarms/groin) 454 g 11  ? ?No current facility-administered medications for this visit.  ? ? ?Patient confirms/reports the following allergies:  ?Allergies  ?Allergen Reactions   ? Fentanyl Shortness Of Breath, Nausea Only and Palpitations  ?  Other reaction(s): Other (See Comments) ?sweating ? Increasing temp., muscle weakness  ? Penicillin G Shortness Of Breath and Swelling  ?  Other reaction(s): Difficulty breathing  ? Shellfish Allergy Other (See Comments)  ?  Angioedema ?Angioedema  ? Tomato Other (See Comments)  ?  Angioedema  ? Bee Pollen Itching  ? Grass Pollen(K-O-R-T-Swt Vern) Other (See Comments)  ? Pollen Extract   ? Aspirin Nausea And Vomiting  ?  Other reaction(s): Nausea And Vomiting  ? Dust Mite Extract Rash  ? ? ?No orders of the defined types were placed in this encounter. ? ? ?AUTHORIZATION INFORMATION ?Primary Insurance: ?1D#: ?Group #: ? ?Secondary Insurance: ?1D#: ?Group #: ? ?SCHEDULE INFORMATION: ?Date: 06/18/2021  ?Time: ?Location: [facility] ? ?

## 2021-05-28 ENCOUNTER — Encounter: Payer: Self-pay | Admitting: *Deleted

## 2021-05-30 ENCOUNTER — Telehealth: Payer: Self-pay

## 2021-05-30 NOTE — Telephone Encounter (Signed)
Received patient clearance he is clear for procedure per [facility]  called patient he understands  ?

## 2021-06-05 ENCOUNTER — Inpatient Hospital Stay: Payer: Medicare Other | Admitting: Oncology

## 2021-06-05 ENCOUNTER — Inpatient Hospital Stay: Payer: Medicare Other

## 2021-06-10 ENCOUNTER — Encounter: Payer: Self-pay | Admitting: Oncology

## 2021-06-10 ENCOUNTER — Inpatient Hospital Stay: Payer: Medicare Other

## 2021-06-10 ENCOUNTER — Inpatient Hospital Stay: Payer: Medicare Other | Attending: Oncology | Admitting: Oncology

## 2021-06-10 ENCOUNTER — Other Ambulatory Visit: Payer: Self-pay | Admitting: Cardiovascular Disease

## 2021-06-10 VITALS — BP 115/80 | HR 66 | Temp 98.4°F | Resp 16 | Wt 147.5 lb

## 2021-06-10 DIAGNOSIS — D708 Other neutropenia: Secondary | ICD-10-CM

## 2021-06-10 DIAGNOSIS — D709 Neutropenia, unspecified: Secondary | ICD-10-CM | POA: Diagnosis not present

## 2021-06-10 LAB — CBC WITH DIFFERENTIAL/PLATELET
Abs Immature Granulocytes: 0.01 10*3/uL (ref 0.00–0.07)
Basophils Absolute: 0.1 10*3/uL (ref 0.0–0.1)
Basophils Relative: 1 %
Eosinophils Absolute: 0.2 10*3/uL (ref 0.0–0.5)
Eosinophils Relative: 4 %
HCT: 51 % (ref 39.0–52.0)
Hemoglobin: 17.2 g/dL — ABNORMAL HIGH (ref 13.0–17.0)
Immature Granulocytes: 0 %
Lymphocytes Relative: 47 %
Lymphs Abs: 2 10*3/uL (ref 0.7–4.0)
MCH: 32.5 pg (ref 26.0–34.0)
MCHC: 33.7 g/dL (ref 30.0–36.0)
MCV: 96.2 fL (ref 80.0–100.0)
Monocytes Absolute: 0.4 10*3/uL (ref 0.1–1.0)
Monocytes Relative: 10 %
Neutro Abs: 1.6 10*3/uL — ABNORMAL LOW (ref 1.7–7.7)
Neutrophils Relative %: 38 %
Platelets: 216 10*3/uL (ref 150–400)
RBC: 5.3 MIL/uL (ref 4.22–5.81)
RDW: 13.2 % (ref 11.5–15.5)
WBC: 4.3 10*3/uL (ref 4.0–10.5)
nRBC: 0 % (ref 0.0–0.2)

## 2021-06-10 LAB — VITAMIN B12: Vitamin B-12: 515 pg/mL (ref 180–914)

## 2021-06-10 LAB — TECHNOLOGIST SMEAR REVIEW: Plt Morphology: ADEQUATE

## 2021-06-10 LAB — HEPATITIS C ANTIBODY: HCV Ab: NONREACTIVE

## 2021-06-10 LAB — FOLATE: Folate: 18.7 ng/mL (ref >5.9)
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
## 2021-06-17 ENCOUNTER — Encounter: Payer: Self-pay | Admitting: Gastroenterology

## 2021-06-18 ENCOUNTER — Other Ambulatory Visit: Payer: Self-pay

## 2021-06-18 ENCOUNTER — Ambulatory Visit: Payer: Medicare Other | Admitting: Anesthesiology

## 2021-06-18 ENCOUNTER — Encounter: Payer: Self-pay | Admitting: Gastroenterology

## 2021-06-18 ENCOUNTER — Encounter
Admission: RE | Disposition: A | Discharge status: Home / Self Care | Payer: Self-pay | Source: Ambulatory Visit | Attending: Gastroenterology

## 2021-06-18 ENCOUNTER — Ambulatory Visit
Admission: RE | Admit: 2021-06-18 | Discharge: 2021-06-18 | Disposition: A | Discharge status: Home / Self Care | Payer: Medicare Other | Source: Ambulatory Visit | Attending: Gastroenterology | Admitting: Gastroenterology

## 2021-06-18 DIAGNOSIS — Z860100 Personal history of colon polyps, unspecified: Secondary | ICD-10-CM

## 2021-06-18 DIAGNOSIS — G4730 Sleep apnea, unspecified: Secondary | ICD-10-CM | POA: Diagnosis not present

## 2021-06-18 DIAGNOSIS — F419 Anxiety disorder, unspecified: Secondary | ICD-10-CM | POA: Diagnosis not present

## 2021-06-18 DIAGNOSIS — I10 Essential (primary) hypertension: Secondary | ICD-10-CM | POA: Insufficient documentation

## 2021-06-18 DIAGNOSIS — R195 Other fecal abnormalities: Secondary | ICD-10-CM | POA: Insufficient documentation

## 2021-06-18 DIAGNOSIS — F32A Depression, unspecified: Secondary | ICD-10-CM | POA: Diagnosis not present

## 2021-06-18 DIAGNOSIS — J449 Chronic obstructive pulmonary disease, unspecified: Secondary | ICD-10-CM | POA: Diagnosis not present

## 2021-06-18 DIAGNOSIS — E785 Hyperlipidemia, unspecified: Secondary | ICD-10-CM | POA: Insufficient documentation

## 2021-06-18 DIAGNOSIS — Z1211 Encounter for screening for malignant neoplasm of colon: Secondary | ICD-10-CM | POA: Diagnosis not present

## 2021-06-18 DIAGNOSIS — Z21 Asymptomatic human immunodeficiency virus [HIV] infection status: Secondary | ICD-10-CM | POA: Diagnosis not present

## 2021-06-18 DIAGNOSIS — I2510 Atherosclerotic heart disease of native coronary artery without angina pectoris: Secondary | ICD-10-CM | POA: Insufficient documentation

## 2021-06-18 DIAGNOSIS — Z87891 Personal history of nicotine dependence: Secondary | ICD-10-CM | POA: Insufficient documentation

## 2021-06-18 DIAGNOSIS — K5730 Diverticulosis of large intestine without perforation or abscess without bleeding: Secondary | ICD-10-CM | POA: Diagnosis not present

## 2021-06-18 DIAGNOSIS — R569 Unspecified convulsions: Secondary | ICD-10-CM | POA: Diagnosis not present

## 2021-06-18 HISTORY — PX: COLONOSCOPY WITH PROPOFOL: SHX5780

## 2021-06-18 SURGERY — COLONOSCOPY WITH PROPOFOL
Anesthesia: General

## 2021-06-18 MED ORDER — PEG 3350-KCL-NABCB-NACL-NASULF 236 G PO SOLR: 4000.0000 mL | Freq: Once | ORAL | 0 refills | Status: AC

## 2021-06-18 MED ORDER — LIDOCAINE HCL (CARDIAC) PF 100 MG/5ML IV SOSY
PREFILLED_SYRINGE | INTRAVENOUS | Status: DC | PRN
  Administered 2021-06-18: 60 mg via INTRAVENOUS

## 2021-06-18 MED ORDER — PROPOFOL 500 MG/50ML IV EMUL
INTRAVENOUS | Status: DC | PRN
Start: 2021-06-18 — End: 2021-06-18
  Administered 2021-06-18: 140 ug/kg/min via INTRAVENOUS

## 2021-06-18 MED ORDER — PROPOFOL 10 MG/ML IV BOLUS
INTRAVENOUS | Status: DC | PRN
  Administered 2021-06-18: 60 mg via INTRAVENOUS

## 2021-06-18 MED ORDER — SODIUM CHLORIDE 0.9 % IV SOLN
INTRAVENOUS | Status: DC
  Administered 2021-06-18: 1000 mL via INTRAVENOUS

## 2021-06-18 NOTE — Anesthesia Preprocedure Evaluation (Addendum)
Anesthesia Evaluation  ?Patient identified by MRN, date of birth, ID band ?Patient awake ? ? ? ?Reviewed: ?Allergy & Precautions, NPO status , Patient's Chart, lab work & pertinent test results ? ?Airway ?Mallampati: II ? ?TM Distance: >3 FB ?Neck ROM: Full ? ? ? Dental ? ?(+) Partial Upper, Partial Lower ?  ?Pulmonary ?asthma , sleep apnea (Non-comliant with CPAP) , pneumonia, resolved, COPD,  COPD inhaler, former smoker,  ?  ?Pulmonary exam normal ? ? ? ? ? ? ? Cardiovascular ?hypertension, Pt. on medications ?+ CAD (Cleared By Cardiology)  ?Normal cardiovascular exam ? ? ?  ?Neuro/Psych ?Seizures -, Well Controlled,  PSYCHIATRIC DISORDERS Anxiety Depression  Neuromuscular disease   ? GI/Hepatic ?negative GI ROS, Neg liver ROS,   ?Endo/Other  ?negative endocrine ROS ? Renal/GU ?negative Renal ROS  ?negative genitourinary ?  ?Musculoskeletal ? ?(+) Arthritis , Osteoarthritis,   ? Abdominal ?Normal abdominal exam  (+)   ?Peds ?negative pediatric ROS ?(+)  Hematology ?negative hematology ROS ?(+) Blood dyscrasia, anemia ,   ?Anesthesia Other Findings ?Allergy    ?Anemia    ?Asthma    ?Chronic low back pain    ?Colon polyps  tubular and hyperplastic Dr. Allen Norris   ?Controlled insomnia    ?Coronary artery disease ?Left ventricle: The cavity size was normal. There was mild  ???concentric hypertrophy. Systolic function was normal. The  ???estimated ejection fraction was in the range of 55% to 60%.  ?Eczema    ?Emphysema of lung ([place]) 09/22/2016 ?History of chicken pox    ?History of kidney stones   ?HIV infection ([place])    ?Hyperlipidemia    ?Hypertension    ?Numbness in right leg  outside of right foot, related to medical device implant in spine  ?Osteoarthritis of lumbar spine  also- hips  ?Other forms of scoliosis, thoracolumbar region    ?Pneumonia    ?Pre-diabetes    ?Restless leg syndrome    ?Syphilis    ?Syphilis    ? ? ? Reproductive/Obstetrics ?negative OB ROS ? ?   ? ? ? ? ? ? ? ? ? ? ? ? ? ?  ?  ? ? ? ? ? ? ? ?Anesthesia Physical ? ?Anesthesia Plan ? ?ASA: 2 ? ?Anesthesia Plan: General  ? ?Post-op Pain Management:   ? ?Induction: Intravenous ? ?PONV Risk Score and Plan: 2 and Propofol infusion and TIVA ? ?Airway Management Planned: Natural Airway and Nasal Cannula ? ?Additional Equipment:  ? ?Intra-op Plan:  ? ?Post-operative Plan:  ? ?Informed Consent: I have reviewed the patients History and Physical, chart, labs and discussed the procedure including the risks, benefits and alternatives for the proposed anesthesia with the patient or authorized representative who has indicated his/her understanding and acceptance.  ? ? ? ? ? ?Plan Discussed with: CRNA, Anesthesiologist and Surgeon ? ?Anesthesia Plan Comments:   ? ? ? ? ? ? ?Anesthesia Quick Evaluation ? ?

## 2021-06-18 NOTE — Anesthesia Postprocedure Evaluation (Signed)
Anesthesia Post Note ? ?Patient: Samuel Burnett ? ?Procedure(s) Performed: COLONOSCOPY WITH PROPOFOL ? ?Patient location during evaluation: Endoscopy ?Anesthesia Type: General ?Level of consciousness: awake and alert ?Pain management: pain level controlled ?Vital Signs Assessment: post-procedure vital signs reviewed and stable ?Respiratory status: spontaneous breathing, nonlabored ventilation and respiratory function stable ?Cardiovascular status: blood pressure returned to baseline and stable ?Postop Assessment: no apparent nausea or vomiting ?Anesthetic complications: no ? ? ?No notable events documented. ? ? ?Last Vitals:  ?Vitals:  ? 06/18/21 0920 06/18/21 0930  ?BP: (!) 163/98 (!) 124/95  ?Pulse: 64 (!) 51  ?Resp: 12 12  ?Temp:    ?SpO2: 100% 98%  ?  ?Last Pain:  ?Vitals:  ? 06/18/21 0900  ?TempSrc: Temporal  ?PainSc:   ? ? ?  ?  ?  ?  ?  ?  ? ?[place] Ouch ? ? ? ? ?
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
## 2021-06-18 NOTE — Op Note (Signed)
[facility] ?Gastroenterology ?Patient Name: Samuel Burnett ?Procedure Date: 06/18/2021 8:37 AM ?MRN: [id number] ?Account #: [id number] ?Date of Birth: 1960/11/14 ?Admit Type: Outpatient ?Age: 61 ?Room: [facility] ENDO ROOM 4 ?Gender: Male ?Note Status: Finalized ?Instrument Name: Colonoscope [id number] ?Procedure:             Colonoscopy ?Indications:           High risk colon cancer surveillance: Personal history  ?                       of colonic polyps ?Providers:             [name] MD, MD ?Medicines:             Propofol per Anesthesia ?Complications:         No immediate complications. ?Procedure:             Pre-Anesthesia Assessment: ?                       - Prior to the procedure, a History and Physical was  ?                       performed, and patient medications and allergies were  ?                       reviewed. The patient's tolerance of previous  ?                       anesthesia was also reviewed. The risks and benefits  ?                       of the procedure and the sedation options and risks  ?                       were discussed with the patient. All questions were  ?                       answered, and informed consent was obtained. Prior  ?                       Anticoagulants: The patient has taken no previous  ?                       anticoagulant or antiplatelet agents. ASA Grade  ?                       Assessment: II - A patient with mild systemic disease.  ?                       After reviewing the risks and benefits, the patient  ?                       was deemed in satisfactory condition to undergo the  ?                       procedure. ?                       After obtaining informed consent, the colonoscope was  ?  passed under direct vision. Throughout the procedure,  ?                       the patient's blood pressure, pulse, and oxygen  ?                       saturations were monitored continuously. The  ?                        Colonoscope was introduced through the anus with the  ?                       intention of advancing to the ileum. The scope was  ?                       advanced to the ascending colon before the procedure  ?                       was aborted. Medications were given. The colonoscopy  ?                       was performed without difficulty. The patient  ?                       tolerated the procedure well. The quality of the bowel  ?                       preparation was poor. ?Findings: ?     The perianal and digital rectal examinations were normal. ?     A large amount of stool was found in the entire colon, precluding  ?     visualization. ?Impression:            - Preparation of the colon was poor. ?                       - Stool in the entire examined colon. ?                       - No specimens collected. ?Recommendation:        - Discharge patient to home. ?                       - Resume previous diet. ?                       - Continue present medications. ?                       - Repeat colonoscopy because the bowel preparation was  ?                       poor. ?Procedure Code(s):     --- Professional --- ?                       [phone], 53, Colonoscopy, flexible; diagnostic,  ?                       including collection of specimen(s) by brushing or  ?  washing, when performed (separate procedure) ?Diagnosis Code(s):     --- Professional --- ?                       Z86.010, Personal history of colonic polyps ?CPT copyright 2019 American Medical Association. All rights reserved. ?The codes documented in this report are preliminary and upon coder review may  ?be revised to meet current compliance requirements. ?[name] MD, MD ?06/18/2021 9:04:52 AM ?This report has been signed electronically. ?Number of Addenda: 0 ?Note Initiated On: 06/18/2021 8:37 AM ?Scope Withdrawal Time: 0 hours 2 minutes 14 seconds  ?Total Procedure Duration: 0 hours 10 minutes 24 seconds  ?Estimated Blood  Loss:  Estimated blood loss: none. ?     [facility] ?

## 2021-06-18 NOTE — Transfer of Care (Signed)
Immediate Anesthesia Transfer of Care Note ? ?Patient: Samuel Burnett ? ?Procedure(s) Performed: COLONOSCOPY WITH PROPOFOL ? ?Patient Location: PACU ? ?Anesthesia Type:General ? ?Level of Consciousness: awake, alert  and oriented ? ?Airway & Oxygen Therapy: Patient Spontanous Breathing ? ?Post-op Assessment: Report given to RN and Post -op Vital signs reviewed and stable ? ?Post vital signs: Reviewed and stable ? ?Last Vitals:  ?Vitals Value Taken Time  ?BP 114/84 06/18/21 0907  ?Temp    ?Pulse 79 06/18/21 0907  ?Resp 17 06/18/21 0908  ?SpO2 99 % 06/18/21 0907  ?Vitals shown include unvalidated device data. ? ?Last Pain:  ?Vitals:  ? 06/18/21 0824  ?TempSrc: Temporal  ?PainSc: 0-No pain  ?   ? ?  ? ?Complications: No notable events documented. ?
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
## 2021-06-19 ENCOUNTER — Encounter: Payer: Self-pay | Admitting: Gastroenterology

## 2021-06-20 ENCOUNTER — Ambulatory Visit: Payer: Medicare Other | Admitting: Anesthesiology

## 2021-06-20 ENCOUNTER — Other Ambulatory Visit: Payer: Self-pay

## 2021-06-20 ENCOUNTER — Encounter: Payer: Self-pay | Admitting: Gastroenterology

## 2021-06-20 ENCOUNTER — Encounter
Admission: RE | Disposition: A | Discharge status: Home / Self Care | Payer: Self-pay | Source: Ambulatory Visit | Attending: Gastroenterology

## 2021-06-20 ENCOUNTER — Ambulatory Visit
Admission: RE | Admit: 2021-06-20 | Discharge: 2021-06-20 | Disposition: A | Discharge status: Home / Self Care | Payer: Medicare Other | Source: Ambulatory Visit | Attending: Gastroenterology | Admitting: Gastroenterology

## 2021-06-20 DIAGNOSIS — G2581 Restless legs syndrome: Secondary | ICD-10-CM | POA: Diagnosis not present

## 2021-06-20 DIAGNOSIS — M1990 Unspecified osteoarthritis, unspecified site: Secondary | ICD-10-CM | POA: Insufficient documentation

## 2021-06-20 DIAGNOSIS — G4730 Sleep apnea, unspecified: Secondary | ICD-10-CM | POA: Diagnosis not present

## 2021-06-20 DIAGNOSIS — Z87891 Personal history of nicotine dependence: Secondary | ICD-10-CM | POA: Diagnosis not present

## 2021-06-20 DIAGNOSIS — Z21 Asymptomatic human immunodeficiency virus [HIV] infection status: Secondary | ICD-10-CM | POA: Insufficient documentation

## 2021-06-20 DIAGNOSIS — K635 Polyp of colon: Secondary | ICD-10-CM

## 2021-06-20 DIAGNOSIS — F419 Anxiety disorder, unspecified: Secondary | ICD-10-CM | POA: Diagnosis not present

## 2021-06-20 DIAGNOSIS — Z1211 Encounter for screening for malignant neoplasm of colon: Secondary | ICD-10-CM | POA: Insufficient documentation

## 2021-06-20 DIAGNOSIS — D126 Benign neoplasm of colon, unspecified: Secondary | ICD-10-CM | POA: Diagnosis not present

## 2021-06-20 DIAGNOSIS — Z86010 Personal history of colonic polyps: Secondary | ICD-10-CM

## 2021-06-20 DIAGNOSIS — K5730 Diverticulosis of large intestine without perforation or abscess without bleeding: Secondary | ICD-10-CM | POA: Diagnosis not present

## 2021-06-20 DIAGNOSIS — K641 Second degree hemorrhoids: Secondary | ICD-10-CM | POA: Insufficient documentation

## 2021-06-20 DIAGNOSIS — F32A Depression, unspecified: Secondary | ICD-10-CM | POA: Diagnosis not present

## 2021-06-20 DIAGNOSIS — D124 Benign neoplasm of descending colon: Secondary | ICD-10-CM | POA: Insufficient documentation

## 2021-06-20 DIAGNOSIS — J439 Emphysema, unspecified: Secondary | ICD-10-CM | POA: Diagnosis not present

## 2021-06-20 DIAGNOSIS — I10 Essential (primary) hypertension: Secondary | ICD-10-CM | POA: Diagnosis not present

## 2021-06-20 HISTORY — PX: COLONOSCOPY: SHX5424

## 2021-06-20 HISTORY — PX: POLYPECTOMY: SHX5525

## 2021-06-20 SURGERY — COLONOSCOPY
Anesthesia: General

## 2021-06-20 MED ORDER — LIDOCAINE HCL (CARDIAC) PF 100 MG/5ML IV SOSY
PREFILLED_SYRINGE | INTRAVENOUS | Status: DC | PRN
  Administered 2021-06-20: 30 mg via INTRAVENOUS

## 2021-06-20 MED ORDER — PROPOFOL 10 MG/ML IV BOLUS
INTRAVENOUS | Status: DC | PRN
  Administered 2021-06-20 (×2): 50 mg via INTRAVENOUS
  Administered 2021-06-20: 80 mg via INTRAVENOUS
  Administered 2021-06-20 (×2): 50 mg via INTRAVENOUS
  Administered 2021-06-20: 30 mg via INTRAVENOUS

## 2021-06-20 MED ORDER — ACETAMINOPHEN 160 MG/5ML PO SOLN: 325.0000 mg | ORAL | Status: DC | PRN

## 2021-06-20 MED ORDER — LACTATED RINGERS IV SOLN: INTRAVENOUS | Status: DC

## 2021-06-20 MED ORDER — SODIUM CHLORIDE 0.9 % IV SOLN: INTRAVENOUS | Status: DC

## 2021-06-20 MED ORDER — ACETAMINOPHEN 325 MG PO TABS: 325.0000 mg | ORAL_TABLET | ORAL | Status: DC | PRN

## 2021-06-20 MED ORDER — ONDANSETRON HCL 4 MG/2ML IJ SOLN: 4.0000 mg | Freq: Once | INTRAMUSCULAR | Status: DC | PRN

## 2021-06-20 MED ORDER — STERILE WATER FOR IRRIGATION IR SOLN
Status: DC | PRN
  Administered 2021-06-20: 1

## 2021-06-20 SURGICAL SUPPLY — 8 items
FORCEPS BIOP RAD 4 LRG CAP 4 (CUTTING FORCEPS) ×1 IMPLANT
GOWN CVR UNV OPN BCK APRN NK (MISCELLANEOUS) ×4 IMPLANT
GOWN ISOL THUMB LOOP REG UNIV (MISCELLANEOUS) ×6
KIT PRC NS LF DISP ENDO (KITS) ×2 IMPLANT
KIT PROCEDURE OLYMPUS (KITS) ×3
MANIFOLD NEPTUNE II (INSTRUMENTS) ×3 IMPLANT
TRAP ETRAP POLY (MISCELLANEOUS) ×1 IMPLANT
WATER STERILE IRR 250ML POUR (IV SOLUTION) ×3 IMPLANT

## 2021-06-20 NOTE — Anesthesia Preprocedure Evaluation (Addendum)
Anesthesia Evaluation  ?Patient identified by MRN, date of birth, ID band ?Patient awake ? ? ? ?Reviewed: ?Allergy & Precautions, NPO status  ? ?Airway ?Mallampati: II ? ?TM Distance: >3 FB ? ? ? ? Dental ?  ?Pulmonary ?asthma , sleep apnea , COPD, former smoker,  ?  ?Pulmonary exam normal ? ? ? ? ? ? ? Cardiovascular ?hypertension,  ?Rhythm:Regular Rate:Normal ? ?HLD ?  ?Neuro/Psych ?Seizures - (post traumatic),  PSYCHIATRIC DISORDERS Anxiety Depression RLS ?  ? GI/Hepatic ?  ?Endo/Other  ? ? Renal/GU ?  ? ?  ?Musculoskeletal ? ?(+) Arthritis ,  ? Abdominal ?  ?Peds ? Hematology ?  ?Anesthesia Other Findings ?HIV ? Reproductive/Obstetrics ? ?  ? ? ? ? ? ? ? ? ? ? ? ? ? ?  ?  ? ? ? ? ? ? ? ?Anesthesia Physical ?Anesthesia Plan ? ?ASA: 3 ? ?Anesthesia Plan: General  ? ?Post-op Pain Management:   ? ?Induction: Intravenous ? ?PONV Risk Score and Plan: Propofol infusion, TIVA and Treatment may vary due to age or medical condition ? ?Airway Management Planned: Natural Airway and Nasal Cannula ? ?Additional Equipment:  ? ?Intra-op Plan:  ? ?Post-operative Plan:  ? ?Informed Consent: I have reviewed the patients History and Physical, chart, labs and discussed the procedure including the risks, benefits and alternatives for the proposed anesthesia with the patient or authorized representative who has indicated his/her understanding and acceptance.  ? ? ? ? ? ?Plan Discussed with: CRNA ? ?Anesthesia Plan Comments:   ? ? ? ? ? ? ?Anesthesia Quick Evaluation ? ?

## 2021-06-20 NOTE — Anesthesia Postprocedure Evaluation (Signed)
Anesthesia Post Note ? ?Patient: TAISHAUN LEVELS ? ?Procedure(s) Performed: COLONOSCOPY ?POLYPECTOMY (descending colon) ? ? ?  ?Patient location during evaluation: PACU ?Anesthesia Type: General ?Level of consciousness: awake ?Pain management: pain level controlled ?Vital Signs Assessment: post-procedure vital signs reviewed and stable ?Respiratory status: respiratory function stable ?Cardiovascular status: stable ?Postop Assessment: no signs of nausea or vomiting ?Anesthetic complications: no ? ? ?No notable events documented. ? ?Veda Canning ? ? ? ? ? ?

## 2021-06-20 NOTE — Op Note (Signed)
[facility] ?Gastroenterology ?Patient Name: Samuel Burnett ?Procedure Date: 06/20/2021 10:20 AM ?MRN: [id number] ?Account #: [id number] ?Date of Birth: 08/27/1960 ?Admit Type: Outpatient ?Age: 61 ?Room: [facility] OR ROOM 01 ?Gender: Male ?Note Status: Finalized ?Instrument Name: [id number] ?Procedure:             Colonoscopy ?Indications:           High risk colon cancer surveillance: Personal history  ?                       of colonic polyps ?Providers:             [name] MD, MD ?Referring MD:          Nino Glow Mclean-Scocuzza MD, MD (Referring MD) ?Medicines:             Propofol per Anesthesia ?Complications:         No immediate complications. ?Procedure:             Pre-Anesthesia Assessment: ?                       - Prior to the procedure, a History and Physical was  ?                       performed, and patient medications and allergies were  ?                       reviewed. The patient's tolerance of previous  ?                       anesthesia was also reviewed. The risks and benefits  ?                       of the procedure and the sedation options and risks  ?                       were discussed with the patient. All questions were  ?                       answered, and informed consent was obtained. Prior  ?                       Anticoagulants: The patient has taken no previous  ?                       anticoagulant or antiplatelet agents. ASA Grade  ?                       Assessment: II - A patient with mild systemic disease.  ?                       After reviewing the risks and benefits, the patient  ?                       was deemed in satisfactory condition to undergo the  ?                       procedure. ?  After obtaining informed consent, the colonoscope was  ?                       passed under direct vision. Throughout the procedure,  ?                       the patient's blood pressure, pulse, and oxygen  ?                       saturations were  monitored continuously. The  ?                       Colonoscope was introduced through the anus and  ?                       advanced to the the cecum, identified by appendiceal  ?                       orifice and ileocecal valve. The colonoscopy was  ?                       performed without difficulty. The patient tolerated  ?                       the procedure well. The quality of the bowel  ?                       preparation was excellent. ?Findings: ?     The perianal and digital rectal examinations were normal. ?     Two sessile polyps were found in the descending colon. The polyps were 3  ?     to 6 mm in size. These polyps were removed with a cold biopsy forceps.  ?     Resection and retrieval were complete. ?     Multiple small-mouthed diverticula were found in the sigmoid colon and  ?     descending colon. ?     Non-bleeding internal hemorrhoids were found during retroflexion. The  ?     hemorrhoids were Grade II (internal hemorrhoids that prolapse but reduce  ?     spontaneously). ?Impression:            - Two 3 to 6 mm polyps in the descending colon,  ?                       removed with a cold biopsy forceps. Resected and  ?                       retrieved. ?                       - Diverticulosis in the sigmoid colon and in the  ?                       descending colon. ?                       - Non-bleeding internal hemorrhoids. ?Recommendation:        - Discharge patient to home. ?                       -  Resume previous diet. ?                       - Continue present medications. ?                       - Await pathology results. ?                       - Repeat colonoscopy in 7 years for surveillance. ?Procedure Code(s):     --- Professional --- ?                       [phone], Colonoscopy, flexible; with biopsy, single or  ?                       multiple ?Diagnosis Code(s):     --- Professional --- ?                       Z86.010, Personal history of colonic polyps ?                        K63.5, Polyp of colon ?CPT copyright 2019 American Medical Association. All rights reserved. ?The codes documented in this report are preliminary and upon coder review may  ?be revised to meet current compliance requirements. ?[name] MD, MD ?06/20/2021 10:47:30 AM ?This report has been signed electronically. ?Number of Addenda: 0 ?Note Initiated On: 06/20/2021 10:20 AM ?Scope Withdrawal Time: 0 hours 9 minutes 22 seconds  ?Total Procedure Duration: 0 hours 16 minutes 48 seconds  ?Estimated Blood Loss:  Estimated blood loss: none. ?     [facility] ?

## 2021-06-20 NOTE — Transfer of Care (Signed)
Immediate Anesthesia Transfer of Care Note ? ?Patient: Samuel Burnett ? ?Procedure(s) Performed: COLONOSCOPY ?POLYPECTOMY (descending colon) ? ?Patient Location: PACU ? ?Anesthesia Type: General ? ?Level of Consciousness: awake, alert  and patient cooperative ? ?Airway and Oxygen Therapy: Patient Spontanous Breathing and Patient connected to supplemental oxygen ? ?Post-op Assessment: Post-op Vital signs reviewed, Patient's Cardiovascular Status Stable, Respiratory Function Stable, Patent Airway and No signs of Nausea or vomiting ? ?Post-op Vital Signs: Reviewed and stable ? ?Complications: No notable events documented. ? ?
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
## 2021-06-21 ENCOUNTER — Encounter: Payer: Self-pay | Admitting: Gastroenterology

## 2021-06-24 ENCOUNTER — Encounter: Payer: Self-pay | Admitting: Gastroenterology

## 2021-06-24 LAB — SURGICAL PATHOLOGY

## 2021-06-27 DIAGNOSIS — Z7951 Long term (current) use of inhaled steroids: Secondary | ICD-10-CM | POA: Diagnosis not present

## 2021-06-27 DIAGNOSIS — Z23 Encounter for immunization: Secondary | ICD-10-CM | POA: Diagnosis not present

## 2021-06-27 DIAGNOSIS — Z8719 Personal history of other diseases of the digestive system: Secondary | ICD-10-CM | POA: Diagnosis not present

## 2021-06-27 DIAGNOSIS — Z87891 Personal history of nicotine dependence: Secondary | ICD-10-CM | POA: Diagnosis not present

## 2021-06-27 DIAGNOSIS — Z79899 Other long term (current) drug therapy: Secondary | ICD-10-CM | POA: Diagnosis not present

## 2021-06-28 ENCOUNTER — Encounter: Payer: Self-pay | Admitting: Oncology

## 2021-06-28 ENCOUNTER — Inpatient Hospital Stay (HOSPITAL_BASED_OUTPATIENT_CLINIC_OR_DEPARTMENT_OTHER): Payer: Medicare Other | Admitting: Oncology

## 2021-06-28 DIAGNOSIS — D751 Secondary polycythemia: Secondary | ICD-10-CM | POA: Diagnosis not present

## 2021-06-28 DIAGNOSIS — D708 Other neutropenia: Secondary | ICD-10-CM | POA: Diagnosis not present
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
## 2021-07-25 ENCOUNTER — Encounter: Payer: Medicare Other | Admitting: Internal Medicine

## 2021-07-31 ENCOUNTER — Ambulatory Visit (INDEPENDENT_AMBULATORY_CARE_PROVIDER_SITE_OTHER): Payer: Medicare Other | Admitting: Internal Medicine

## 2021-07-31 ENCOUNTER — Encounter: Payer: Self-pay | Admitting: Internal Medicine

## 2021-07-31 VITALS — BP 110/80 | HR 73 | Temp 98.1°F | Resp 14 | Ht 71.0 in | Wt 145.4 lb

## 2021-07-31 DIAGNOSIS — E782 Mixed hyperlipidemia: Secondary | ICD-10-CM

## 2021-07-31 DIAGNOSIS — Z125 Encounter for screening for malignant neoplasm of prostate: Secondary | ICD-10-CM

## 2021-07-31 DIAGNOSIS — R5383 Other fatigue: Secondary | ICD-10-CM

## 2021-07-31 DIAGNOSIS — J4530 Mild persistent asthma, uncomplicated: Secondary | ICD-10-CM

## 2021-07-31 DIAGNOSIS — Z0000 Encounter for general adult medical examination without abnormal findings: Secondary | ICD-10-CM

## 2021-07-31 DIAGNOSIS — I10 Essential (primary) hypertension: Secondary | ICD-10-CM | POA: Diagnosis not present

## 2021-07-31 DIAGNOSIS — Z1389 Encounter for screening for other disorder: Secondary | ICD-10-CM

## 2021-07-31 DIAGNOSIS — Z1329 Encounter for screening for other suspected endocrine disorder: Secondary | ICD-10-CM

## 2021-07-31 DIAGNOSIS — D751 Secondary polycythemia: Secondary | ICD-10-CM

## 2021-07-31 DIAGNOSIS — J3089 Other allergic rhinitis: Secondary | ICD-10-CM | POA: Diagnosis not present

## 2021-07-31 DIAGNOSIS — E559 Vitamin D deficiency, unspecified: Secondary | ICD-10-CM

## 2021-07-31 LAB — CBC WITH DIFFERENTIAL/PLATELET
Basophils Absolute: 0 10*3/uL (ref 0.0–0.1)
Basophils Relative: 0.5 % (ref 0.0–3.0)
Eosinophils Absolute: 0.2 10*3/uL (ref 0.0–0.7)
Eosinophils Relative: 4.4 % (ref 0.0–5.0)
HCT: 44.7 % (ref 39.0–52.0)
Hemoglobin: 15.1 g/dL (ref 13.0–17.0)
Lymphocytes Relative: 40.1 % (ref 12.0–46.0)
Lymphs Abs: 1.6 10*3/uL (ref 0.7–4.0)
MCHC: 33.9 g/dL (ref 30.0–36.0)
MCV: 98.5 fl (ref 78.0–100.0)
Monocytes Absolute: 0.5 10*3/uL (ref 0.1–1.0)
Monocytes Relative: 11.4 % (ref 3.0–12.0)
Neutro Abs: 1.7 10*3/uL (ref 1.4–7.7)
Neutrophils Relative %: 43.6 % (ref 43.0–77.0)
Platelets: 183 10*3/uL (ref 150.0–400.0)
RBC: 4.53 Mil/uL (ref 4.22–5.81)
RDW: 14.7 % (ref 11.5–15.5)
WBC: 4 10*3/uL (ref 4.0–10.5)

## 2021-07-31 LAB — TSH: TSH: 1.19 u[IU]/mL (ref 0.35–5.50)

## 2021-07-31 LAB — VITAMIN D 25 HYDROXY (VIT D DEFICIENCY, FRACTURES): VITD: 38.79 ng/mL (ref 30.00–100.00)

## 2021-07-31 MED ORDER — MONTELUKAST SODIUM 10 MG PO TABS: ORAL_TABLET | ORAL | 3 refills | Status: DC

## 2021-07-31 MED ORDER — ALBUTEROL SULFATE HFA 108 (90 BASE) MCG/ACT IN AERS: 1.0000 | INHALATION_SPRAY | Freq: Four times a day (QID) | RESPIRATORY_TRACT | 3 refills | Status: DC | PRN

## 2021-07-31 MED ORDER — AZELASTINE HCL 0.1 % NA SOLN: 2.0000 | Freq: Two times a day (BID) | NASAL | 3 refills | Status: DC
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
## 2021-07-31 NOTE — Patient Instructions (Signed)
[place] in [place]

## 2021-08-01 ENCOUNTER — Telehealth: Payer: Self-pay

## 2021-08-01 ENCOUNTER — Telehealth: Payer: Self-pay | Admitting: Internal Medicine

## 2021-08-01 LAB — URINALYSIS, ROUTINE W REFLEX MICROSCOPIC
Bilirubin Urine: NEGATIVE
Glucose, UA: NEGATIVE
Hgb urine dipstick: NEGATIVE
Ketones, ur: NEGATIVE
Nitrite: NEGATIVE
Specific Gravity, Urine: 1.02 (ref 1.001–1.035)
pH: 5.5 (ref 5.0–8.0)

## 2021-08-01 LAB — MICROSCOPIC MESSAGE

## 2021-08-01 MED ORDER — ALBUTEROL SULFATE HFA 108 (90 BASE) MCG/ACT IN AERS: 1.0000 | INHALATION_SPRAY | Freq: Four times a day (QID) | RESPIRATORY_TRACT | 3 refills | Status: DC | PRN

## 2021-08-01 NOTE — Addendum Note (Signed)
Addended by: Orland Mustard on: 08/01/2021 04:54 PM   Modules accepted: Orders

## 2021-08-01 NOTE — Telephone Encounter (Signed)
Copied from [place] [phone]. Topic: Medicare AWV >> Aug 01, 2021 10:35 AM Harris-Coley, Hannah Beat wrote: Reason for CRM: Left message for patient to schedule Annual Wellness Visit.  Please schedule with Nurse Health Advisor Denisa O'Brien-Blaney, LPN at [facility].  Please call [phone] ask for [employer]

## 2021-08-01 NOTE — Telephone Encounter (Signed)
Lvm for pt to return call in regards to labs.  Per Dr.Tracy: Blood cts normal  Thyroid lab normal  Vitamin D normal  Urine few bacteria any burning if so do clean and dirty urine please order x 2 urine culture and urine cytology for G/C/T/BV

## 2021-08-02 ENCOUNTER — Other Ambulatory Visit: Payer: Medicare Other

## 2021-08-02 ENCOUNTER — Other Ambulatory Visit (HOSPITAL_COMMUNITY)
Admission: RE | Admit: 2021-08-02 | Discharge: 2021-08-02 | Disposition: A | Discharge status: Home / Self Care | Payer: Medicare Other | Source: Ambulatory Visit | Attending: Internal Medicine | Admitting: Internal Medicine

## 2021-08-02 ENCOUNTER — Other Ambulatory Visit: Payer: Self-pay

## 2021-08-02 DIAGNOSIS — R300 Dysuria: Secondary | ICD-10-CM

## 2021-08-05 ENCOUNTER — Other Ambulatory Visit: Payer: Self-pay | Admitting: Internal Medicine

## 2021-08-05 DIAGNOSIS — N3000 Acute cystitis without hematuria: Secondary | ICD-10-CM

## 2021-08-05 LAB — URINE CULTURE
MICRO NUMBER:: 13450841
SPECIMEN QUALITY:: ADEQUATE

## 2021-08-05 MED ORDER — CIPROFLOXACIN HCL 500 MG PO TABS: 500.0000 mg | ORAL_TABLET | Freq: Two times a day (BID) | ORAL | 0 refills | Status: AC

## 2021-08-06 LAB — URINE CYTOLOGY ANCILLARY ONLY
Bacterial Vaginitis-Urine: NEGATIVE
Candida Urine: NEGATIVE
Chlamydia: NEGATIVE
Comment: NEGATIVE
Comment: NEGATIVE
Comment: NORMAL
Neisseria Gonorrhea: NEGATIVE
Trichomonas: NEGATIVE

## 2021-09-04 ENCOUNTER — Other Ambulatory Visit: Payer: Self-pay | Admitting: Cardiovascular Disease

## 2021-09-13 ENCOUNTER — Encounter: Payer: Self-pay | Admitting: Internal Medicine

## 2021-09-13 ENCOUNTER — Ambulatory Visit (INDEPENDENT_AMBULATORY_CARE_PROVIDER_SITE_OTHER): Payer: Medicare Other | Admitting: Internal Medicine

## 2021-09-13 VITALS — BP 110/80 | HR 89 | Temp 97.9°F | Resp 14 | Ht 71.0 in | Wt 137.6 lb

## 2021-09-13 DIAGNOSIS — G2581 Restless legs syndrome: Secondary | ICD-10-CM

## 2021-09-13 DIAGNOSIS — G4700 Insomnia, unspecified: Secondary | ICD-10-CM | POA: Diagnosis not present

## 2021-09-13 DIAGNOSIS — I10 Essential (primary) hypertension: Secondary | ICD-10-CM | POA: Diagnosis not present

## 2021-09-13 DIAGNOSIS — L309 Dermatitis, unspecified: Secondary | ICD-10-CM | POA: Diagnosis not present

## 2021-09-13 DIAGNOSIS — G8929 Other chronic pain: Secondary | ICD-10-CM

## 2021-09-13 DIAGNOSIS — R7303 Prediabetes: Secondary | ICD-10-CM | POA: Diagnosis not present

## 2021-09-13 DIAGNOSIS — E782 Mixed hyperlipidemia: Secondary | ICD-10-CM | POA: Diagnosis not present

## 2021-09-13 DIAGNOSIS — I252 Old myocardial infarction: Secondary | ICD-10-CM

## 2021-09-13 MED ORDER — TRIAMCINOLONE ACETONIDE 0.1 % EX CREA: 1.0000 | TOPICAL_CREAM | Freq: Two times a day (BID) | CUTANEOUS | 11 refills | Status: DC

## 2021-09-13 MED ORDER — DULOXETINE HCL 60 MG PO CPEP: 60.0000 mg | ORAL_CAPSULE | Freq: Every day | ORAL | 3 refills | Status: DC

## 2021-09-13 MED ORDER — CLOTRIMAZOLE 1 % EX CREA: TOPICAL_CREAM | CUTANEOUS | 4 refills | Status: DC

## 2021-09-13 MED ORDER — ROPINIROLE HCL 1 MG PO TABS: 1.0000 mg | ORAL_TABLET | Freq: Every day | ORAL | 3 refills | Status: DC

## 2021-09-13 MED ORDER — EZETIMIBE 10 MG PO TABS: 10.0000 mg | ORAL_TABLET | Freq: Every day | ORAL | 3 refills | Status: DC

## 2021-09-13 MED ORDER — PRAVASTATIN SODIUM 40 MG PO TABS: ORAL_TABLET | ORAL | 3 refills | Status: DC

## 2021-09-13 MED ORDER — HYDROCORTISONE 2.5 % EX CREA: 1.0000 | TOPICAL_CREAM | Freq: Two times a day (BID) | CUTANEOUS | 11 refills | Status: DC

## 2021-09-13 MED ORDER — TRAZODONE HCL 50 MG PO TABS: 50.0000 mg | ORAL_TABLET | Freq: Every evening | ORAL | 3 refills | Status: DC | PRN

## 2021-09-13 MED ORDER — AMLODIPINE BESYLATE 2.5 MG PO TABS: 2.5000 mg | ORAL_TABLET | Freq: Every day | ORAL | 3 refills | Status: DC

## 2021-09-13 NOTE — Patient Instructions (Addendum)
Latest Reference Range & Units 05/15/21 10:08  Hemoglobin A1C 4.6 - 6.5 % 5.9  If # gets to 5.6 no prediabetes currently you have prediabetes   Call [facility] in [place], [facility] in [place], [facility] in [place] to see if they will take donated medications   [place] or [facility] in [place] (PCP Internal Medicine and all specialist urology, ID, ?ortho, ?neurosurgery, cardiology)   Prediabetes Prediabetes is when your blood sugar (blood glucose) level is higher than normal but not high enough for you to be diagnosed with type 2 diabetes. Having prediabetes puts you at risk for developing type 2 diabetes (type 2 diabetes mellitus). With certain lifestyle changes, you may be able to prevent or delay the onset of type 2 diabetes. This is important because type 2 diabetes can lead to serious complications, such as: Heart disease. Stroke. Blindness. Kidney disease. Depression. Poor circulation in the feet and legs. In severe cases, this could lead to surgical removal of a leg (amputation). What are the causes? The exact cause of prediabetes is not known. It may result from insulin resistance. Insulin resistance develops when cells in the body do not respond properly to insulin that the body makes. This can cause excess glucose to build up in the blood. High blood glucose (hyperglycemia) can develop. What increases the risk? The following factors may make you more likely to develop this condition: You have a family member with type 2 diabetes. You are older than 45 years. You had a temporary form of diabetes during a pregnancy (gestational diabetes). You had polycystic ovary syndrome (PCOS). You are overweight or obese. You are inactive (sedentary). You have a history of heart disease, including problems with cholesterol levels, high levels of blood fats, or high blood pressure. What are the signs or symptoms? You may have no  symptoms. If you do have symptoms, they may include: Increased hunger. Increased thirst. Increased urination. Vision changes, such as blurry vision. Tiredness (fatigue). How is this diagnosed? This condition can be diagnosed with blood tests. Your blood glucose may be checked with one or more of the following tests: A fasting blood glucose (FBG) test. You will not be allowed to eat (you will fast) for at least 8 hours before a blood sample is taken. An A1C blood test (hemoglobin A1C). This test provides information about blood glucose levels over the previous 2?3 months. An oral glucose tolerance test (OGTT). This test measures your blood glucose at two points in time: After fasting. This is your baseline level. Two hours after you drink a beverage that contains glucose. You may be diagnosed with prediabetes if: Your FBG is 100?125 mg/dL (5.6-6.9 mmol/L). Your A1C level is 5.7?6.4% (39-46 mmol/mol). Your OGTT result is 140?199 mg/dL (7.8-11 mmol/L). These blood tests may be repeated to confirm your diagnosis. How is this treated? Treatment may include dietary and lifestyle changes to help lower your blood glucose and prevent type 2 diabetes from developing. In some cases, medicine may be prescribed to help lower the risk of type 2 diabetes. Follow these instructions at home: Nutrition  Follow a healthy meal plan. This includes eating lean proteins, whole grains, legumes, fresh fruits and vegetables, low-fat dairy products, and healthy fats. Follow instructions from your health care provider about eating or drinking restrictions. Meet with a dietitian to create a healthy eating plan that is right for you. Lifestyle Do moderate-intensity exercise for at least 30 minutes a day on 5  or more days each week, or as told by your health care provider. A mix of activities may be best, such as: Brisk walking, swimming, biking, and weight lifting. Lose weight as told by your health care provider.  Losing 5-7% of your body weight can reverse insulin resistance. Do not drink alcohol if: Your health care provider tells you not to drink. You are pregnant, may be pregnant, or are planning to become pregnant. If you drink alcohol: Limit how much you use to: 0-1 drink a day for women. 0-2 drinks a day for men. Be aware of how much alcohol is in your drink. In the [place] one drink equals one 12 oz bottle of beer (355 mL), one 5 oz glass of wine (148 mL), or one 1 oz glass of hard liquor (44 mL). General instructions Take over-the-counter and prescription medicines only as told by your health care provider. You may be prescribed medicines that help lower the risk of type 2 diabetes. Do not use any products that contain nicotine or tobacco, such as cigarettes, e-cigarettes, and chewing tobacco. If you need help quitting, ask your health care provider. Keep all follow-up visits. This is important. Where to find more information American Diabetes Association: www.diabetes.org Academy of Nutrition and Dietetics: www.eatright.org American Heart Association: www.heart.org Contact a health care provider if: You have any of these symptoms: Increased hunger. Increased urination. Increased thirst. Fatigue. Vision changes, such as blurry vision. Get help right away if you: Have shortness of breath. Feel confused. Vomit or feel like you may vomit. Summary Prediabetes is when your blood sugar (blood glucose)level is higher than normal but not high enough for you to be diagnosed with type 2 diabetes. Having prediabetes puts you at risk for developing type 2 diabetes (type 2 diabetes mellitus). Make lifestyle changes such as eating a healthy diet and exercising regularly to help prevent diabetes. Lose weight as told by your health care provider. This information is not intended to replace advice given to you by your health care provider. Make sure you discuss any questions you have with your health  care provider. Document Revised: 05/26/2019 Document Reviewed: 05/26/2019 Elsevier Patient Education  [place].  Prediabetes Eating Plan Prediabetes is a condition that causes blood sugar (glucose) levels to be higher than normal. This increases the risk for developing type 2 diabetes (type 2 diabetes mellitus). Working with a health care provider or nutrition specialist (dietitian) to make diet and lifestyle changes can help prevent the onset of diabetes. These changes may help you: Control your blood glucose levels. Improve your cholesterol levels. Manage your blood pressure. What are tips for following this plan? Reading food labels Read food labels to check the amount of fat, salt (sodium), and sugar in prepackaged foods. Avoid foods that have: Saturated fats. Trans fats. Added sugars. Avoid foods that have more than 300 milligrams (mg) of sodium per serving. Limit your sodium intake to less than 2,300 mg each day. Shopping Avoid buying pre-made and processed foods. Avoid buying drinks with added sugar. Cooking Cook with olive oil. Do not use butter, lard, or ghee. Bake, broil, grill, steam, or boil foods. Avoid frying. Meal planning  Work with your dietitian to create an eating plan that is right for you. This may include tracking how many calories you take in each day. Use a food diary, notebook, or mobile application to track what you eat at each meal. Consider following a Mediterranean diet. This includes: Eating several servings of fresh fruits and  vegetables each day. Eating fish at least twice a week. Eating one serving each day of whole grains, beans, nuts, and seeds. Using olive oil instead of other fats. Limiting alcohol. Limiting red meat. Using nonfat or low-fat dairy products. Consider following a plant-based diet. This includes dietary choices that focus on eating mostly vegetables and fruit, grains, beans, nuts, and seeds. If you have high blood  pressure, you may need to limit your sodium intake or follow a diet such as the DASH (Dietary Approaches to Stop Hypertension) eating plan. The DASH diet aims to lower high blood pressure. Lifestyle Set weight loss goals with help from your health care team. It is recommended that most people with prediabetes lose 7% of their body weight. Exercise for at least 30 minutes 5 or more days a week. Attend a support group or seek support from a mental health counselor. Take over-the-counter and prescription medicines only as told by your health care provider. What foods are recommended? Fruits Berries. Bananas. Apples. Oranges. Grapes. Papaya. Mango. Pomegranate. Kiwi. Grapefruit. Cherries. Vegetables Lettuce. Spinach. Peas. Beets. Cauliflower. Cabbage. Broccoli. Carrots. Tomatoes. Squash. Eggplant. Herbs. Peppers. Onions. Cucumbers. Brussels sprouts. Grains Whole grains, such as whole-wheat or whole-grain breads, crackers, cereals, and pasta. Unsweetened oatmeal. Bulgur. Barley. Quinoa. Brown rice. Corn or whole-wheat flour tortillas or taco shells. Meats and other proteins Seafood. Poultry without skin. Lean cuts of pork and beef. Tofu. Eggs. Nuts. Beans. Dairy Low-fat or fat-free dairy products, such as yogurt, cottage cheese, and cheese. Beverages Water. Tea. Coffee. Sugar-free or diet soda. Seltzer water. Low-fat or nonfat milk. Milk alternatives, such as soy or almond milk. Fats and oils Olive oil. Canola oil. Sunflower oil. Grapeseed oil. Avocado. Walnuts. Sweets and desserts Sugar-free or low-fat pudding. Sugar-free or low-fat ice cream and other frozen treats. Seasonings and condiments Herbs. Sodium-free spices. Mustard. Relish. Low-salt, low-sugar ketchup. Low-salt, low-sugar barbecue sauce. Low-fat or fat-free mayonnaise. The items listed above may not be a complete list of recommended foods and beverages. Contact a dietitian for more information. What foods are not  recommended? Fruits Fruits canned with syrup. Vegetables Canned vegetables. Frozen vegetables with butter or cream sauce. Grains Refined white flour and flour products, such as bread, pasta, snack foods, and cereals. Meats and other proteins Fatty cuts of meat. Poultry with skin. Breaded or fried meat. Processed meats. Dairy Full-fat yogurt, cheese, or milk. Beverages Sweetened drinks, such as iced tea and soda. Fats and oils Butter. Lard. Ghee. Sweets and desserts Baked goods, such as cake, cupcakes, pastries, cookies, and cheesecake. Seasonings and condiments Spice mixes with added salt. Ketchup. Barbecue sauce. Mayonnaise. The items listed above may not be a complete list of foods and beverages that are not recommended. Contact a dietitian for more information. Where to find more information American Diabetes Association: www.diabetes.org Summary You may need to make diet and lifestyle changes to help prevent the onset of diabetes. These changes can help you control blood sugar, improve cholesterol levels, and manage blood pressure. Set weight loss goals with help from your health care team. It is recommended that most people with prediabetes lose 7% of their body weight. Consider following a Mediterranean diet. This includes eating plenty of fresh fruits and vegetables, whole grains, beans, nuts, seeds, fish, and low-fat dairy, and using olive oil instead of other fats. This information is not intended to replace advice given to you by your health care provider. Make sure you discuss any questions you have with your health care provider. Document Revised: 05/26/2019  Document Reviewed: 05/26/2019 Elsevier Patient Education  [place].
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
## 2021-09-22 IMAGING — CR DG LUMBAR SPINE 2-3V
1 series · 3 of 3 positions shown · non-contrast
Comparison: 04/22/2018

CLINICAL DATA: Back and hip pain

EXAM:
LUMBAR SPINE - 2-3 VIEW

[Series 1: dg lumbar spine 2-3 views · 0.14mm/px · 3 of 3 slices shown]
[im 1/3]
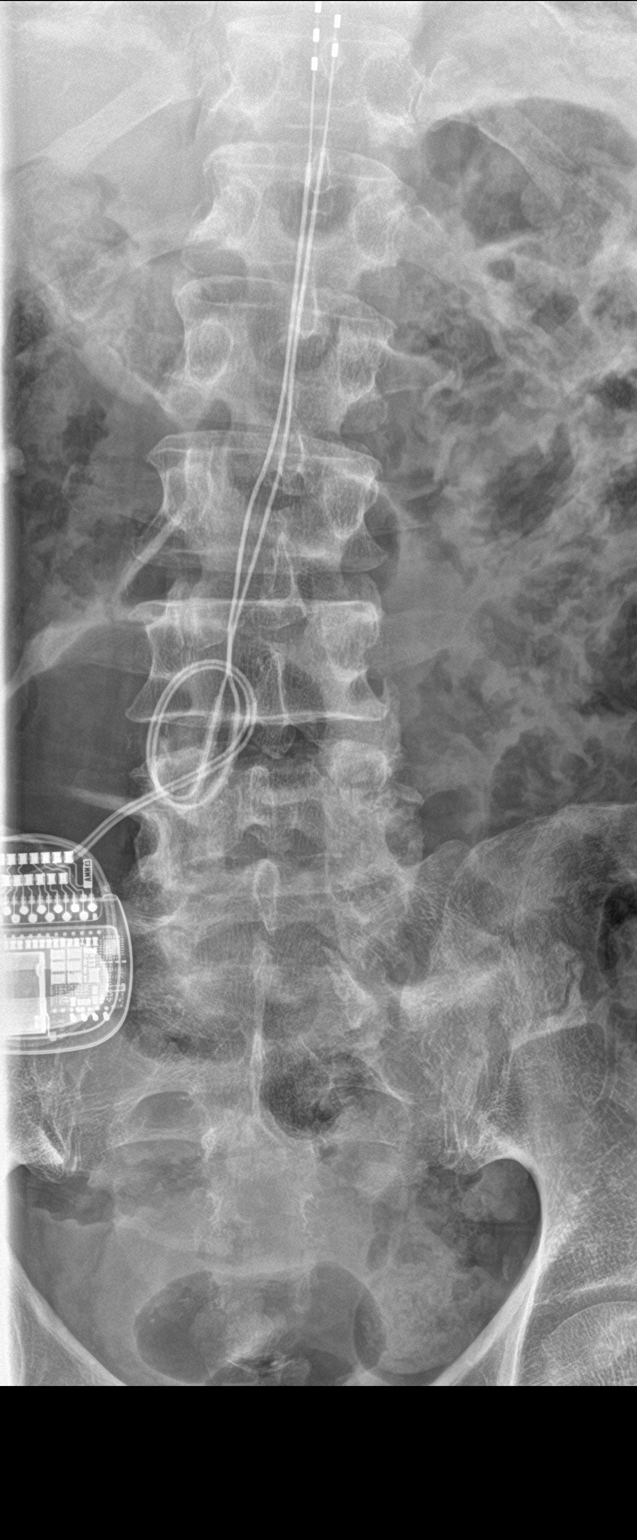
[im 2/3]
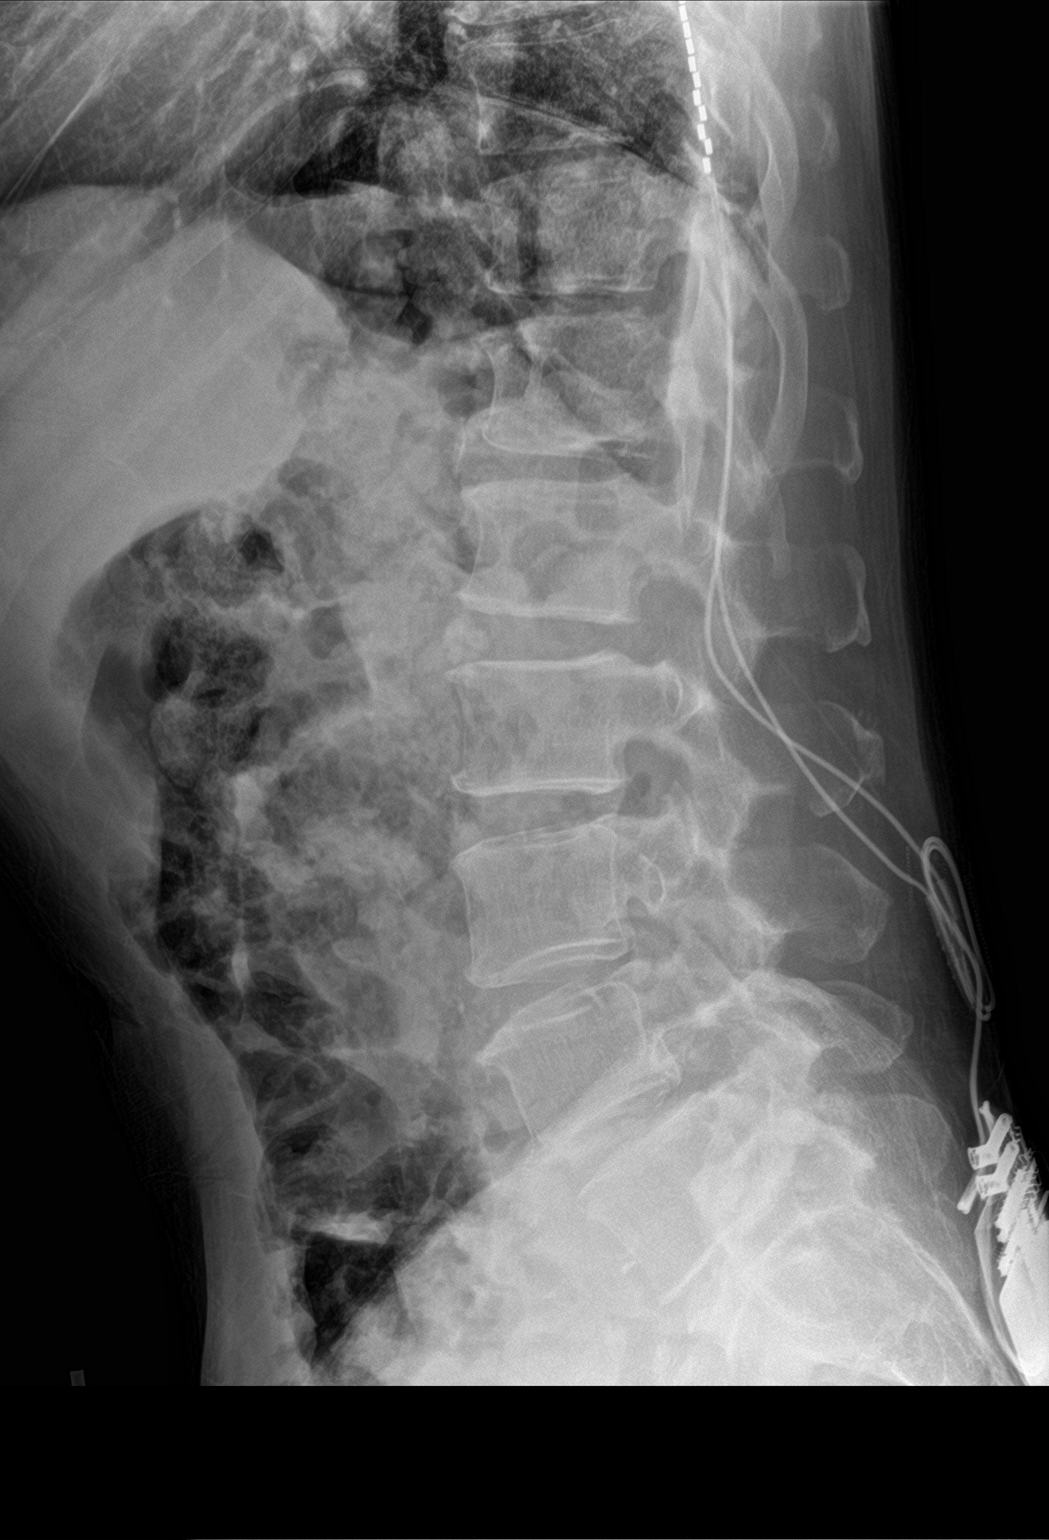
[im 3/3]
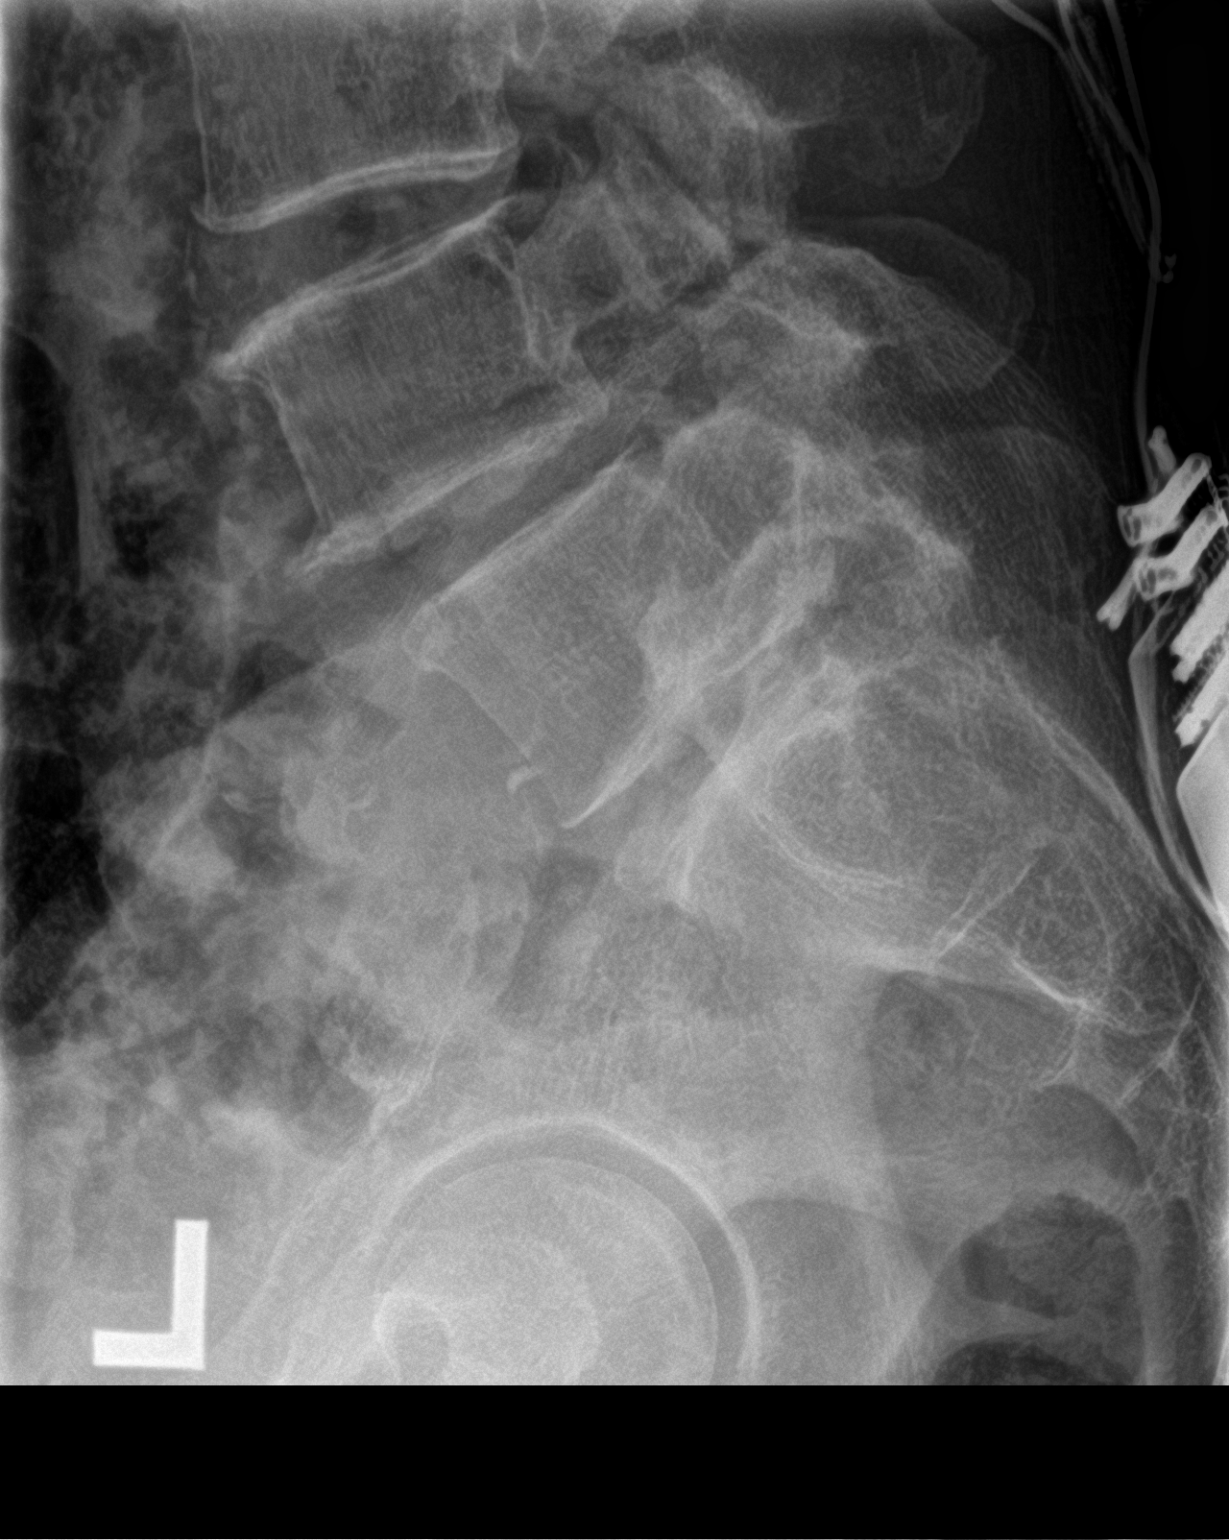

[3 of 3 positions shown; findings below may reference images not displayed]

FINDINGS: There is no evidence of lumbar spine fracture. Alignment is normal
without static listhesis. Mild disc height loss at L4-5 and L5-S1.
Lower lumbar facet arthropathy. Partially visualized spinal
stimulator leads extending into the lower thoracic spine.
IMPRESSION: 1. No acute osseous abnormality of the lumbar spine.
2. Mild degenerative disc disease and facet arthropathy at L4-5 and
L5-S1.

## 2021-09-24 ENCOUNTER — Ambulatory Visit (INDEPENDENT_AMBULATORY_CARE_PROVIDER_SITE_OTHER): Payer: Medicare Other

## 2021-09-24 VITALS — Ht 71.0 in | Wt 145.0 lb

## 2021-09-24 DIAGNOSIS — Z0000 Encounter for general adult medical examination without abnormal findings: Secondary | ICD-10-CM
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
## 2021-09-24 NOTE — Patient Instructions (Addendum)
  Samuel Burnett , Thank you for taking time to come for your Medicare Wellness Visit. I appreciate your ongoing commitment to your health goals. Please review the following plan we discussed and let me know if I can assist you in the future.   These are the goals we discussed:  Goals       Patient Stated     Weight goal 170lb (pt-stated)      Stay active High protein diet Stay hydrated      Other     Follow up with Primary Care Provider      As needed        This is a list of the screening recommended for you and due dates:  Health Maintenance  Topic Date Due   COVID-19 Vaccine (1) 06/03/2022*   Flu Shot  10/08/2021   Tetanus Vaccine  12/19/2025   Colon Cancer Screening  06/20/2028   Hepatitis C Screening: USPSTF Recommendation to screen - Ages 18-79 yo.  Completed   HIV Screening  Completed   Zoster (Shingles) Vaccine  Completed   HPV Vaccine  Aged Out   Urine Protein Check  Discontinued  *Topic was postponed. The date shown is not the original due date.

## 2021-10-16 ENCOUNTER — Telehealth: Payer: Self-pay | Admitting: Internal Medicine

## 2021-10-16 ENCOUNTER — Telehealth: Payer: Self-pay
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
## 2021-10-16 NOTE — Telephone Encounter (Signed)
Error

## 2021-10-16 NOTE — Telephone Encounter (Deleted)
Patient is requesting labs for STD testing due to a recent encounter.  No lab order in chart. Patient is requesting a call back.   He also requested a OV with provider.  He was scheduled on 11/05/21.

## 2021-10-17 ENCOUNTER — Encounter: Payer: Self-pay | Admitting: Nurse Practitioner

## 2021-10-17 ENCOUNTER — Ambulatory Visit (INDEPENDENT_AMBULATORY_CARE_PROVIDER_SITE_OTHER): Payer: Medicare Other | Admitting: Nurse Practitioner

## 2021-10-17 VITALS — BP 106/72 | HR 79 | Temp 97.3°F | Ht 71.0 in | Wt 137.0 lb

## 2021-10-17 DIAGNOSIS — Z7252 High risk homosexual behavior: Secondary | ICD-10-CM | POA: Diagnosis not present

## 2021-10-17 NOTE — Addendum Note (Signed)
Addended by: Ellamae Sia on: 10/17/2021 02:37 PM   Modules accepted: Orders

## 2021-10-17 NOTE — Patient Instructions (Signed)
Nice to see you today I will be in touch with the swab results once I have them Recommend that you abstain from sexual intercourse until the results are in Follow up if you have symptoms
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
## 2021-10-18 LAB — C. TRACHOMATIS/N. GONORRHOEAE RNA
C. trachomatis RNA, TMA: NOT DETECTED
N. gonorrhoeae RNA, TMA: NOT DETECTED

## 2021-10-22 LAB — CT/NG RNA, TMA RECTAL
Chlamydia Trachomatis RNA: NOT DETECTED
Neisseria Gonorrhoeae RNA: NOT DETECTED

## 2021-10-24 LAB — TRICHOMONAS VAGINALIS RNA, QL,MALES: Trichomonas vaginalis RNA: NOT DETECTED

## 2021-11-04 ENCOUNTER — Telehealth: Payer: Self-pay | Admitting: Internal Medicine

## 2021-11-04 NOTE — Telephone Encounter (Signed)
Patient called in returning a call he received.  

## 2021-11-04 NOTE — Telephone Encounter (Signed)
See lab result notes, patient advised.

## 2021-11-05 ENCOUNTER — Encounter: Payer: Self-pay | Admitting: Internal Medicine

## 2021-11-05 ENCOUNTER — Ambulatory Visit (INDEPENDENT_AMBULATORY_CARE_PROVIDER_SITE_OTHER): Payer: Medicare Other | Admitting: Internal Medicine

## 2021-11-05 VITALS — BP 120/60 | HR 71 | Temp 98.2°F | Ht 71.0 in | Wt 137.2 lb

## 2021-11-05 DIAGNOSIS — Z113 Encounter for screening for infections with a predominantly sexual mode of transmission: Secondary | ICD-10-CM | POA: Diagnosis not present

## 2021-11-05 DIAGNOSIS — L739 Follicular disorder, unspecified: Secondary | ICD-10-CM | POA: Diagnosis not present

## 2021-11-05 MED ORDER — DOXYCYCLINE HYCLATE 100 MG PO TABS: 100.0000 mg | ORAL_TABLET | Freq: Two times a day (BID) | ORAL | 0 refills | Status: DC
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
## 2021-11-05 NOTE — Patient Instructions (Addendum)
Prevnar 20 01/23/2025  RSV vaccine at pharmacy if wanted   Skin Abscess  A skin abscess is an infected area on or under your skin that contains a collection of pus and other material. An abscess may also be called a furuncle, carbuncle, or boil. An abscess can occur in or on almost any part of your body. Some abscesses break open (rupture) on their own. Most continue to get worse unless they are treated. The infection can spread deeper into the body and eventually into your blood, which can make you feel ill. Treatment usually involves draining the abscess. What are the causes? An abscess occurs when germs, like bacteria, pass through your skin and cause an infection. This may be caused by: A scrape or cut on your skin. A puncture wound through your skin, including a needle injection or insect bite. Blocked oil or sweat glands. Blocked and infected hair follicles. A cyst that forms beneath your skin (sebaceous cyst) and becomes infected. What increases the risk? This condition is more likely to develop in people who: Have a weak body defense system (immune system). Have diabetes. Have dry and irritated skin. Get frequent injections or use illegal IV drugs. Have a foreign body in a wound, such as a splinter. Have problems with their lymph system or veins. What are the signs or symptoms? Symptoms of this condition include: A painful, firm bump under the skin. A bump with pus at the top. This may break through the skin and drain. Other symptoms include: Redness surrounding the abscess site. Warmth. Swelling of the lymph nodes (glands) near the abscess. Tenderness. A sore on the skin. How is this diagnosed? This condition may be diagnosed based on: A physical exam. Your medical history. A sample of pus. This may be used to find out what is causing the infection. Blood tests. Imaging tests, such as an ultrasound, CT scan, or MRI. How is this treated? A small abscess that drains on  its own may not need treatment. Treatment for larger abscesses may include: Moist heat or heat pack applied to the area several times a day. A procedure to drain the abscess (incision and drainage). Antibiotic medicines. For a severe abscess, you may first get antibiotics through an IV and then change to antibiotics by mouth. Follow these instructions at home: Medicines  Take over-the-counter and prescription medicines only as told by your health care provider. If you were prescribed an antibiotic medicine, take it as told by your health care provider. Do not stop taking the antibiotic even if you start to feel better. Abscess care  If you have an abscess that has not drained, apply heat to the affected area. Use the heat source that your health care provider recommends, such as a moist heat pack or a heating pad. Place a towel between your skin and the heat source. Leave the heat on for 20-30 minutes. Remove the heat if your skin turns bright red. This is especially important if you are unable to feel pain, heat, or cold. You may have a greater risk of getting burned. Follow instructions from your health care provider about how to take care of your abscess. Make sure you: Cover the abscess with a bandage (dressing). Change your dressing or gauze as told by your health care provider. Wash your hands with soap and water before you change the dressing or gauze. If soap and water are not available, use hand sanitizer. Check your abscess every day for signs of a worsening infection.  Check for: More redness, swelling, or pain. More fluid or blood. Warmth. More pus or a bad smell. General instructions To avoid spreading the infection: Do not share personal care items, towels, or hot tubs with others. Avoid making skin contact with other people. Keep all follow-up visits as told by your health care provider. This is important. Contact a health care provider if you have: More redness, swelling,  or pain around your abscess. More fluid or blood coming from your abscess. Warm skin around your abscess. More pus or a bad smell coming from your abscess. Muscle aches. Chills or a general ill feeling. Get help right away if you: Have severe pain. See red streaks on your skin spreading away from the abscess. See redness that spreads quickly. Have a fever or chills. Summary A skin abscess is an infected area on or under your skin that contains a collection of pus and other material. A small abscess that drains on its own may not need treatment. Treatment for larger abscesses may include having a procedure to drain the abscess and taking an antibiotic. This information is not intended to replace advice given to you by your health care provider. Make sure you discuss any questions you have with your health care provider. Document Revised: 05/30/2021 Document Reviewed: 12/03/2020 Elsevier Patient Education  [place].   Folliculitis  Folliculitis is inflammation of the hair follicles. Folliculitis most commonly occurs on the scalp, thighs, legs, back, and buttocks. However, it can occur anywhere on the body. What are the causes? This condition may be caused by: A bacterial infection (common). A fungal infection. A viral infection. Contact with certain chemicals, especially oils and tars. Shaving or waxing. Greasy ointments or creams applied to the skin. Long-lasting folliculitis and folliculitis that keeps coming back may be caused by bacteria. This bacteria can live anywhere on your skin and is often found in the nostrils. What increases the risk? You are more likely to develop this condition if you have: A weakened immune system. Diabetes. Obesity. What are the signs or symptoms? Symptoms of this condition include: Redness. Soreness. Swelling. Itching. Small white or yellow, pus-filled, itchy spots (pustules) that appear over a reddened area. If there is an  infection that goes deep into the follicle, these may develop into a boil (furuncle). A group of closely packed boils (carbuncle). These tend to form in hairy, sweaty areas of the body. How is this diagnosed? This condition is diagnosed with a skin exam. To find what is causing the condition, your health care provider may take a sample of one of the pustules or boils for testing in a lab. How is this treated? This condition may be treated by: Applying warm compresses to the affected areas. Taking an antibiotic medicine or applying an antibiotic medicine to the skin. Applying or bathing with an antiseptic solution. Taking an over-the-counter medicine to help with itching. Having a procedure to drain any pustules or boils. This may be done if a pustule or boil contains a lot of pus or fluid. Having laser hair removal. This may be done to treat long-lasting folliculitis. Follow these instructions at home: Managing pain and swelling  If directed, apply heat to the affected area as often as told by your health care provider. Use the heat source that your health care provider recommends, such as a moist heat pack or a heating pad. Place a towel between your skin and the heat source. Leave the heat on for 20-30 minutes. Remove the  heat if your skin turns bright red. This is especially important if you are unable to feel pain, heat, or cold. You may have a greater risk of getting burned. General instructions If you were prescribed an antibiotic medicine, take it or apply it as told by your health care provider. Do not stop using the antibiotic even if your condition improves. Check the irritated area every day for signs of infection. Check for: Redness, swelling, or pain. Fluid or blood. Warmth. Pus or a bad smell. Do not shave irritated skin. Take over-the-counter and prescription medicines only as told by your health care provider. Keep all follow-up visits as told by your health care provider.  This is important. Get help right away if: You have more redness, swelling, or pain in the affected area. Red streaks are spreading from the affected area. You have a fever. Summary Folliculitis is inflammation of the hair follicles. Folliculitis most commonly occurs on the scalp, thighs, legs, back, and buttocks. This condition may be treated by taking an antibiotic medicine or applying an antibiotic medicine to the skin, and applying or bathing with an antiseptic solution. If you were prescribed an antibiotic medicine, take it or apply it as told by your health care provider. Do not stop using the antibiotic even if your condition improves. Get help right away if you have new or worsening symptoms. Keep all follow-up visits as told by your health care provider. This is important. This information is not intended to replace advice given to you by your health care provider. Make sure you discuss any questions you have with your health care provider. Document Revised: 05/01/2021 Document Reviewed: 12/20/2020 Elsevier Patient Education  [place].

## 2021-12-03 DIAGNOSIS — Z96643 Presence of artificial hip joint, bilateral: Secondary | ICD-10-CM | POA: Diagnosis not present

## 2021-12-03 DIAGNOSIS — Z96641 Presence of right artificial hip joint: Secondary | ICD-10-CM | POA: Diagnosis not present

## 2021-12-03 DIAGNOSIS — Z96642 Presence of left artificial hip joint: Secondary | ICD-10-CM | POA: Diagnosis not present

## 2021-12-03 DIAGNOSIS — I2510 Atherosclerotic heart disease of native coronary artery without angina pectoris: Secondary | ICD-10-CM | POA: Diagnosis not present

## 2021-12-03 DIAGNOSIS — M25552 Pain in left hip: Secondary | ICD-10-CM | POA: Diagnosis not present

## 2021-12-03 DIAGNOSIS — M25551 Pain in right hip: Secondary | ICD-10-CM | POA: Diagnosis not present

## 2021-12-30 ENCOUNTER — Inpatient Hospital Stay: Payer: Medicare Other | Attending: Oncology

## 2021-12-30 ENCOUNTER — Inpatient Hospital Stay (HOSPITAL_BASED_OUTPATIENT_CLINIC_OR_DEPARTMENT_OTHER): Payer: Medicare Other | Admitting: Medical Oncology

## 2021-12-30 ENCOUNTER — Other Ambulatory Visit: Payer: Self-pay

## 2021-12-30 VITALS — BP 106/83 | HR 77 | Temp 97.9°F | Resp 16 | Wt 143.0 lb

## 2021-12-30 DIAGNOSIS — Z87891 Personal history of nicotine dependence: Secondary | ICD-10-CM | POA: Diagnosis not present

## 2021-12-30 DIAGNOSIS — D751 Secondary polycythemia: Secondary | ICD-10-CM | POA: Diagnosis not present

## 2021-12-30 DIAGNOSIS — Z7952 Long term (current) use of systemic steroids: Secondary | ICD-10-CM | POA: Insufficient documentation

## 2021-12-30 DIAGNOSIS — Z7951 Long term (current) use of inhaled steroids: Secondary | ICD-10-CM | POA: Insufficient documentation

## 2021-12-30 DIAGNOSIS — D709 Neutropenia, unspecified: Secondary | ICD-10-CM | POA: Insufficient documentation

## 2021-12-30 DIAGNOSIS — Z21 Asymptomatic human immunodeficiency virus [HIV] infection status: Secondary | ICD-10-CM | POA: Insufficient documentation

## 2021-12-30 DIAGNOSIS — Z79891 Long term (current) use of opiate analgesic: Secondary | ICD-10-CM | POA: Diagnosis not present

## 2021-12-30 DIAGNOSIS — D509 Iron deficiency anemia, unspecified: Secondary | ICD-10-CM

## 2021-12-30 DIAGNOSIS — Z79899 Other long term (current) drug therapy: Secondary | ICD-10-CM | POA: Insufficient documentation

## 2021-12-30 DIAGNOSIS — I10 Essential (primary) hypertension: Secondary | ICD-10-CM | POA: Diagnosis not present

## 2021-12-30 DIAGNOSIS — D708 Other neutropenia: Secondary | ICD-10-CM

## 2021-12-30 DIAGNOSIS — Z79624 Long term (current) use of inhibitors of nucleotide synthesis: Secondary | ICD-10-CM | POA: Diagnosis not present

## 2021-12-30 LAB — CBC WITH DIFFERENTIAL/PLATELET
Abs Immature Granulocytes: 0 10*3/uL (ref 0.00–0.07)
Basophils Absolute: 0.1 10*3/uL (ref 0.0–0.1)
Basophils Relative: 1 %
Eosinophils Absolute: 0 10*3/uL (ref 0.0–0.5)
Eosinophils Relative: 0 %
HCT: 47.7 % (ref 39.0–52.0)
Hemoglobin: 16.4 g/dL (ref 13.0–17.0)
Immature Granulocytes: 0 %
Lymphocytes Relative: 51 %
Lymphs Abs: 2 10*3/uL (ref 0.7–4.0)
MCH: 32.8 pg (ref 26.0–34.0)
MCHC: 34.4 g/dL (ref 30.0–36.0)
MCV: 95.4 fL (ref 80.0–100.0)
Monocytes Absolute: 0.9 10*3/uL (ref 0.1–1.0)
Monocytes Relative: 25 %
Neutro Abs: 0.9 10*3/uL — ABNORMAL LOW (ref 1.7–7.7)
Neutrophils Relative %: 23 %
Platelets: 170 10*3/uL (ref 150–400)
RBC: 5 MIL/uL (ref 4.22–5.81)
RDW: 14.6 % (ref 11.5–15.5)
WBC: 3.8 10*3/uL — ABNORMAL LOW (ref 4.0–10.5)
nRBC: 0 % (ref 0.0–0.2)

## 2021-12-30 LAB — IRON AND TIBC
Iron: 54 ug/dL (ref 45–182)
Saturation Ratios: 13 % — ABNORMAL LOW (ref 17.9–39.5)
TIBC: 423 ug/dL (ref 250–450)
UIBC: 369 ug/dL

## 2021-12-30 LAB — VITAMIN B12: Vitamin B-12: 461 pg/mL (ref 180–914)

## 2021-12-30 LAB — FERRITIN: Ferritin: 110 ng/mL (ref 24–336)

## 2021-12-30 LAB — FOLATE: Folate: 18.4 ng/mL (ref >5.9)
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
## 2022-01-17 ENCOUNTER — Other Ambulatory Visit (INDEPENDENT_AMBULATORY_CARE_PROVIDER_SITE_OTHER): Payer: Medicare Other

## 2022-01-17 ENCOUNTER — Other Ambulatory Visit (HOSPITAL_COMMUNITY)
Admission: RE | Admit: 2022-01-17 | Discharge: 2022-01-17 | Disposition: A | Discharge status: Home / Self Care | Payer: Medicare Other | Source: Ambulatory Visit | Attending: Family | Admitting: Family

## 2022-01-17 DIAGNOSIS — Z113 Encounter for screening for infections with a predominantly sexual mode of transmission: Secondary | ICD-10-CM | POA: Insufficient documentation

## 2022-01-19 ENCOUNTER — Other Ambulatory Visit: Payer: Self-pay | Admitting: Family

## 2022-01-19 MED ORDER — DOXYCYCLINE HYCLATE 100 MG PO TABS: 100.0000 mg | ORAL_TABLET | Freq: Two times a day (BID) | ORAL | 0 refills | Status: DC

## 2022-01-20 LAB — URINE CYTOLOGY ANCILLARY ONLY
Chlamydia: NEGATIVE
Comment: NEGATIVE
Comment: NEGATIVE
Comment: NORMAL
Neisseria Gonorrhea: NEGATIVE
Trichomonas: NEGATIVE

## 2022-01-21 ENCOUNTER — Telehealth: Payer: Self-pay | Admitting: Internal Medicine

## 2022-01-21 NOTE — Telephone Encounter (Signed)
Patient called and would like someone to call him back to go over lab results in more detailed.

## 2022-01-22 LAB — HSV(HERPES SIMPLEX VRS) I + II AB-IGG
HSV 1 IGG,TYPE SPECIFIC AB: <0.9 {index}
HSV 2 IGG,TYPE SPECIFIC AB: 12.6 {index} — ABNORMAL HIGH

## 2022-01-22 LAB — RPR TITER: RPR Titer: 1:4 {titer} — ABNORMAL HIGH

## 2022-01-22 LAB — HEPATITIS C ANTIBODY: Hepatitis C Ab: NONREACTIVE

## 2022-01-22 LAB — RPR: RPR Ser Ql: REACTIVE — AB

## 2022-01-22 LAB — FLUORESCENT TREPONEMAL AB(FTA)-IGG-BLD: Fluorescent Treponemal ABS: REACTIVE — AB

## 2022-01-28 ENCOUNTER — Telehealth: Payer: Self-pay

## 2022-01-28 NOTE — Telephone Encounter (Signed)
Faxed [place] Communicable Disease Report to [facility] fax: [phone] received confirmation that fax was successful. Original sent to be scanned into chart.

## 2022-01-29 ENCOUNTER — Telehealth: Payer: Self-pay

## 2022-01-29 LAB — HM DIABETES EYE EXAM

## 2022-01-29 NOTE — Telephone Encounter (Signed)
Discussed with DIS, Brion Aliment. Sees a lab going all the way back to 1990. Will not follow because of titer, just had one 06/14/20 with titer of 1:2. Does not see where he would need more doxy unless reexposure or symptoms.

## 2022-01-29 NOTE — Telephone Encounter (Signed)
[facility] received reportable disease information from [place] [facility]: Specimen Date= 01/17/22 Source= Blood Type of Test = RPR Result= Reactive Symptomatic = Unknown 01/19/22 Pt treated with "doxycycline 100 mg 14 days"  Additional 2023 info found in Epic: 01/17/22 Titer= 1:4 01/19/22= Doxycycline 100 mg tablets PO BID, dispense #28 01/19/22 notes on lab= "RPR shows reinfection"  Prior syphilis test result in Epic: 06/14/2020 = Reactive, 1:2 06/09/19= Reactive 1:1 05/13/18 =  Reactive 1:2 12/20/15= Reactive 1:2  Earliest prior syphilis TR in CareEverywhere=  08/02/2009 = "Reactive ?," 1:4 (comments), TP "Positive ?" 03/16/10 = 1:2, TP Positive  Additional in Epic: 06/18/21 - 09/13/21 Doxycycline 100 mg PO BID. 06/27/21 syphilis flow sheet (10/08/2009: Doxycycline 100 BID for 28 days for late latent syphilis)  Call DIS about prior completed? New exposures? New Symptoms?

## 2022-01-29 NOTE — Telephone Encounter (Addendum)
Call to Dr's office to discuss syphilis testing/results/etc.   [facility] not to call pt for any additional tx or testing.

## 2022-02-11 NOTE — Telephone Encounter (Signed)
Phone call to [facility] in [place]. Was on hold for extended period of time, hung up, will try again.

## 2022-02-17 ENCOUNTER — Telehealth: Payer: Self-pay

## 2022-02-17 ENCOUNTER — Telehealth: Payer: Self-pay | Admitting: Family Medicine

## 2022-02-17 NOTE — Telephone Encounter (Signed)
No diabetes was documented on problem list. Not on any medication for diabetes.  I am not scheduled to see him until 12/28.  Can you please follow up with this?  It appears that not indicated and I would discontinue if not diabetic.  Thanks

## 2022-02-17 NOTE — Telephone Encounter (Signed)
Progress Notes  Sofie Rower, [place] at 02/17/2022 11:38 AM  Status: Signed  [facility] Quality Team Note   Name: London Sheer Date of Birth: 15-Apr-1960 MRN: [id number] Date: 02/17/2022   [facility] Quality Team has reviewed this patient's chart, please see recommendations below:   [facility] Quality Other; (KED GAP- KIDNEY HEALTH EVALUATION- PATIENT NEEDS URINE MICROALBUMIN/CREATININE RATIO TEST COMPLETED BEFORE END OF YEAR FOR GAP CLOSURE. PATIENT HAS APPT 03/06/2022 PLEASE ADDRESS AT THIS VISIT.)

## 2022-02-17 NOTE — Progress Notes (Signed)
[facility] Quality Team Note  Name: Samuel Burnett Date of Birth: 1960-04-08 MRN: [id number] Date: 02/17/2022  [facility] Quality Team has reviewed this patient's chart, please see recommendations below:  [facility] Quality Other; (KED GAP- KIDNEY HEALTH EVALUATION- PATIENT NEEDS URINE MICROALBUMIN/CREATININE RATIO TEST COMPLETED BEFORE END OF YEAR FOR GAP CLOSURE. PATIENT HAS APPT 03/06/2022 PLEASE ADDRESS AT THIS VISIT.)

## 2022-02-17 NOTE — Telephone Encounter (Signed)
Patient states he is experiencing sinus and chest congestion (phlegm gray and white), cough, weak.  I transferred call to Access Nurse.

## 2022-02-17 NOTE — Telephone Encounter (Signed)
     Pt called in and was transferred to access nurse. See original phone note below:      Walker Shadow P2 hours ago (1:28 PM)    Patient states he is experiencing sinus and chest congestion (phlegm gray and white), cough, weak.  I transferred call to Access Nurse.      Note   [place], [place] [phone]  Walker Shadow P2 hours ago (1:25 PM)   Pt was advised to make an appt as he wanted medication to help. Pt has been scheduled with FNP, Mable Paris on 02/21/22. See access nurse note below:

## 2022-02-17 NOTE — Telephone Encounter (Signed)
Access nurse note sent to the doc of the day Arnett, FNP

## 2022-02-18 NOTE — Progress Notes (Signed)
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  Pending titers.   Orders: -     RPR  Bronchitis Assessment & Plan: Afebrile.  No acute respiratory distress.  Negative flu, COVID. duration 2 weeks.  Smoking history.  Continue albuterol as needed.  Start azithromycin.  Pending chest x-ray to evaluate for PNA .      Return precautions given.   Risks, benefits, and alternatives of the medications and treatment plan prescribed today were discussed, and patient expressed understanding.   Education regarding symptom management and diagnosis given to patient on AVS either electronically or printed.  No follow-ups on file.  Mable Paris, FNP  Subjective:    Patient ID: Samuel Burnett, male    DOB: 11-Dec-1960, 61 y.o.   MRN: [id number]  CC: Samuel Burnett is a 61 y.o. male who presents today for an acute visit.    HPI: Cough x 2 weeks, unchanged Endorses nasal congestion, wheezing Denies SOB, fever, CP  He is using albuterol with relief.   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  No penile lesion.   H/o HSV2   He quit smoking 5 years ago  H/o asthma, emphysema  Allergies: Fentanyl, Penicillin g, Shellfish allergy, Tomato, Bee pollen, Grass pollen([id number] vern), Pollen extract, Aspirin, and Dust mite extract Current Outpatient Medications on File Prior to Visit  Medication Sig Dispense Refill   albuterol (PROAIR HFA) 108 (90 Base) MCG/ACT inhaler Inhale 1-2 puffs into the  lungs every 6 (six) hours as needed for wheezing or shortness of breath. 54 each 3   amLODipine (NORVASC) 2.5 MG tablet Take 1 tablet (2.5 mg total) by mouth daily. 90 tablet 3   azelastine (ASTELIN) 0.1 % nasal spray Place 2 sprays into both nostrils 2 (two) times daily. Use in each nostril as directed 90 mL 3   bictegravir-emtricitabine-tenofovir AF (BIKTARVY) 50-200-25 MG TABS tablet Take 1 tablet by mouth daily.     Blood Pressure Monitor KIT 1 kit by Does not apply route as directed. 1 each 0   Brimonidine Tartrate (LUMIFY) 0.025 % SOLN Place 1 drop into both eyes every other day.     Cholecalciferol (VITAMIN D3 SUPER STRENGTH) 50 MCG (2000 UT) TABS Take 2 tablets (4,000 Units total) by mouth daily. (Patient taking differently: Take 3 tablets by mouth every other day.) 180 tablet 3   clotrimazole (LOTRIMIN) 1 % cream APPLY TO AFFECTED AREA(S)  TOPICALLY TWICE DAILY 90 g 4   doxycycline (VIBRA-TABS) 100 MG tablet Take 1 tablet (100 mg total) by mouth 2 (two) times daily. 28 tablet 0   DULoxetine (CYMBALTA) 60 MG capsule Take 1 capsule (60 mg total) by mouth daily. 90 capsule 3   ezetimibe (ZETIA) 10 MG tablet Take 1 tablet (10 mg total) by mouth daily. 90 tablet 3   fluticasone furoate-vilanterol (BREO ELLIPTA) 100-25 MCG/ACT AEPB Inhale  1 puff into the lungs daily. 180 each 3   hydrocortisone 2.5 % cream Apply 1 Application topically 2 (two) times daily. APPLY TOPICALLY TO FACE  TWICE DAILY Strength: 2.5 % 90 g 11   INSULIN SYRINGE 1CC/29G 29G X 1/2" 1 ML MISC 1 each daily by Other route.     montelukast (SINGULAIR) 10 MG tablet TAKE 1 TABLET BY MOUTH  DAILY AT NIGHT 90 tablet 3   Multiple Vitamins-Minerals (MULTIVITAMIN WITH MINERALS) tablet Take 1 tablet by mouth daily. [employer] MEGA MEN ESSENTIAL 50+     pantoprazole (PROTONIX) 20 MG tablet      pravastatin (PRAVACHOL) 40 MG tablet TAKE 1 TABLET BY MOUTH  DAILY AT NIGHT 90 tablet 3   rOPINIRole (REQUIP) 1 MG tablet Take 1 tablet (1 mg total) by  mouth at bedtime. 90 tablet 3   traZODone (DESYREL) 50 MG tablet Take 1 tablet (50 mg total) by mouth at bedtime as needed. for sleep 90 tablet 3   triamcinolone cream (KENALOG) 0.1 % Apply 1 Application topically 2 (two) times daily. Prn eczema not face, underarms/groin 454 g 11   No current facility-administered medications on file prior to visit.    Review of Systems  Constitutional:  Negative for chills and fever.  HENT:  Positive for congestion.   Respiratory:  Positive for cough and wheezing. Negative for shortness of breath.   Cardiovascular:  Negative for chest pain and palpitations.  Gastrointestinal:  Negative for nausea and vomiting.  Genitourinary:  Negative for genital sores and penile pain.      Objective:    BP 110/80   Pulse 87   Temp 98.6 F (37 C) (Oral)   Ht _0  (1.803 m)   Wt 142 lb 9.6 oz (64.7 kg)   SpO2 96%   BMI 19.89 kg/m   BP Readings from Last 3 Encounters:  02/21/22 110/80  12/30/21 106/83  11/05/21 120/60   Wt Readings from Last 3 Encounters:  02/21/22 142 lb 9.6 oz (64.7 kg)  12/30/21 143 lb (64.9 kg)  11/05/21 137 lb 3.2 oz (62.2 kg)    Physical Exam Vitals reviewed.  Constitutional:      Appearance: He is well-developed.  HENT:     Head: Normocephalic and atraumatic.     Right Ear: Hearing, tympanic membrane, ear canal and external ear normal. No decreased hearing noted. No drainage, swelling or tenderness. No middle ear effusion. Tympanic membrane is not injected, erythematous or bulging.     Left Ear: Hearing, tympanic membrane, ear canal and external ear normal. No decreased hearing noted. No drainage, swelling or tenderness.  No middle ear effusion. Tympanic membrane is not injected, erythematous or bulging.     Nose: Nose normal.     Right Sinus: No maxillary sinus tenderness or frontal sinus tenderness.     Left Sinus: No maxillary sinus tenderness or frontal sinus tenderness.     Mouth/Throat:     Pharynx: Uvula midline. No  oropharyngeal exudate or posterior oropharyngeal erythema.     Tonsils: No tonsillar abscesses.  Eyes:     Conjunctiva/sclera: Conjunctivae normal.  Cardiovascular:     Rate and Rhythm: Regular rhythm.     Heart sounds: Normal heart sounds.  Pulmonary:     Effort: Pulmonary effort is normal. No respiratory distress.     Breath sounds: Normal breath sounds. No wheezing, rhonchi or rales.     Comments: Coarse lung sounds bilateral lower lung Lymphadenopathy:     Head:  Right side of head: No submental, submandibular, tonsillar, preauricular, posterior auricular or occipital adenopathy.     Left side of head: No submental, submandibular, tonsillar, preauricular, posterior auricular or occipital adenopathy.     Cervical: No cervical adenopathy.  Skin:    General: Skin is warm and dry.  Neurological:     Mental Status: He is alert.  Psychiatric:        Speech: Speech normal.        Behavior: Behavior normal.

## 2022-02-18 NOTE — Telephone Encounter (Signed)
Yes  Will discuss DM Appears h/p prediabetes

## 2022-02-21 ENCOUNTER — Encounter: Payer: Self-pay | Admitting: Family

## 2022-02-21 ENCOUNTER — Ambulatory Visit (INDEPENDENT_AMBULATORY_CARE_PROVIDER_SITE_OTHER): Payer: Medicare Other

## 2022-02-21 ENCOUNTER — Ambulatory Visit (INDEPENDENT_AMBULATORY_CARE_PROVIDER_SITE_OTHER): Payer: Medicare Other | Admitting: Family

## 2022-02-21 VITALS — BP 110/80 | HR 87 | Temp 98.6°F | Ht 71.0 in | Wt 142.6 lb

## 2022-02-21 DIAGNOSIS — A530 Latent syphilis, unspecified as early or late: Secondary | ICD-10-CM

## 2022-02-21 DIAGNOSIS — R051 Acute cough: Secondary | ICD-10-CM

## 2022-02-21 DIAGNOSIS — J439 Emphysema, unspecified: Secondary | ICD-10-CM | POA: Diagnosis not present

## 2022-02-21 DIAGNOSIS — R7303 Prediabetes: Secondary | ICD-10-CM

## 2022-02-21 DIAGNOSIS — R059 Cough, unspecified: Secondary | ICD-10-CM | POA: Diagnosis not present

## 2022-02-21 DIAGNOSIS — J40 Bronchitis, not specified as acute or chronic: Secondary | ICD-10-CM | POA: Diagnosis not present

## 2022-02-21 LAB — COMPREHENSIVE METABOLIC PANEL
ALT: 11 U/L (ref 0–53)
AST: 18 U/L (ref 0–37)
Albumin: 4.2 g/dL (ref 3.5–5.2)
Alkaline Phosphatase: 82 U/L (ref 39–117)
BUN: 11 mg/dL (ref 6–23)
CO2: 28 mEq/L (ref 19–32)
Calcium: 9.2 mg/dL (ref 8.4–10.5)
Chloride: 106 mEq/L (ref 96–112)
Creatinine, Ser: 0.96 mg/dL (ref 0.40–1.50)
GFR: 84.12 mL/min (ref >60.00)
Glucose, Bld: 107 mg/dL — ABNORMAL HIGH (ref 70–99)
Potassium: 4.1 mEq/L (ref 3.5–5.1)
Sodium: 141 mEq/L (ref 135–145)
Total Bilirubin: 0.5 mg/dL (ref 0.2–1.2)
Total Protein: 6.6 g/dL (ref 6.0–8.3)

## 2022-02-21 LAB — POCT INFLUENZA A/B
Influenza A, POC: NEGATIVE
Influenza B, POC: NEGATIVE

## 2022-02-21 LAB — MICROALBUMIN / CREATININE URINE RATIO
Creatinine,U: 192.8 mg/dL
Microalb Creat Ratio: 2.7 mg/g (ref 0.0–30.0)
Microalb, Ur: 5.1 mg/dL — ABNORMAL HIGH (ref 0.0–1.9)

## 2022-02-21 LAB — HEMOGLOBIN A1C: Hgb A1c MFr Bld: 6.1 % (ref 4.6–6.5)

## 2022-02-21 LAB — POC COVID19 BINAXNOW: SARS Coronavirus 2 Ag: NEGATIVE

## 2022-02-21 MED ORDER — AZITHROMYCIN 250 MG PO TABS: ORAL_TABLET | ORAL | 0 refills | Status: AC

## 2022-02-21 NOTE — Assessment & Plan Note (Addendum)
Afebrile.  No acute respiratory distress.  Negative flu, COVID. duration 2 weeks.  Smoking history.  Continue albuterol as needed.  Start azithromycin.  Pending chest x-ray to evaluate for PNA .

## 2022-02-21 NOTE — Assessment & Plan Note (Signed)
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  Pending titers.

## 2022-02-25 LAB — RPR TITER: RPR Titer: 1:4 {titer} — ABNORMAL HIGH

## 2022-02-25 LAB — RPR: RPR Ser Ql: REACTIVE — AB

## 2022-02-25 LAB — T PALLIDUM AB: T Pallidum Abs: POSITIVE — AB

## 2022-02-26 ENCOUNTER — Telehealth: Payer: Self-pay | Admitting: Family

## 2022-02-26 NOTE — Telephone Encounter (Signed)
[facility] you are well.   I wanted to ensure that we were in line as it relates to reporting syphilis to health department.  I also wanted your advice in regards to titers.  Patient treated with doxycycline 01/09/2022 reactive 1:4.   I repeated titers 12/15 which are unchanged.  I suspect this may be chronic.  He denied any lesions during our visit  Please let me know if patient needs to follow-up with your office or if titers need to be repeated

## 2022-03-06 ENCOUNTER — Encounter: Payer: Self-pay | Admitting: Family Medicine

## 2022-03-06 ENCOUNTER — Ambulatory Visit (INDEPENDENT_AMBULATORY_CARE_PROVIDER_SITE_OTHER): Payer: Medicare Other | Admitting: Family Medicine

## 2022-03-06 VITALS — BP 112/72 | HR 87 | Temp 97.8°F | Ht 71.0 in | Wt 142.4 lb

## 2022-03-06 DIAGNOSIS — E782 Mixed hyperlipidemia: Secondary | ICD-10-CM

## 2022-03-06 DIAGNOSIS — D708 Other neutropenia: Secondary | ICD-10-CM | POA: Diagnosis not present

## 2022-03-06 DIAGNOSIS — R7303 Prediabetes: Secondary | ICD-10-CM

## 2022-03-06 DIAGNOSIS — E538 Deficiency of other specified B group vitamins: Secondary | ICD-10-CM | POA: Diagnosis not present

## 2022-03-06 DIAGNOSIS — Z125 Encounter for screening for malignant neoplasm of prostate: Secondary | ICD-10-CM

## 2022-03-06 DIAGNOSIS — I10 Essential (primary) hypertension: Secondary | ICD-10-CM

## 2022-03-06 DIAGNOSIS — J431 Panlobular emphysema: Secondary | ICD-10-CM | POA: Diagnosis not present

## 2022-03-06 DIAGNOSIS — Z122 Encounter for screening for malignant neoplasm of respiratory organs: Secondary | ICD-10-CM | POA: Diagnosis not present

## 2022-03-06 DIAGNOSIS — J4520 Mild intermittent asthma, uncomplicated: Secondary | ICD-10-CM

## 2022-03-06 DIAGNOSIS — I700 Atherosclerosis of aorta: Secondary | ICD-10-CM

## 2022-03-06 DIAGNOSIS — I152 Hypertension secondary to endocrine disorders: Secondary | ICD-10-CM

## 2022-03-06 DIAGNOSIS — Z1329 Encounter for screening for other suspected endocrine disorder: Secondary | ICD-10-CM

## 2022-03-06 DIAGNOSIS — E119 Type 2 diabetes mellitus without complications: Secondary | ICD-10-CM

## 2022-03-06 DIAGNOSIS — E1159 Type 2 diabetes mellitus with other circulatory complications: Secondary | ICD-10-CM

## 2022-03-06 DIAGNOSIS — A530 Latent syphilis, unspecified as early or late: Secondary | ICD-10-CM

## 2022-03-06 DIAGNOSIS — E559 Vitamin D deficiency, unspecified: Secondary | ICD-10-CM

## 2022-03-06 DIAGNOSIS — Z7689 Persons encountering health services in other specified circumstances: Secondary | ICD-10-CM

## 2022-03-06 NOTE — Patient Instructions (Addendum)
It was a pleasure meeting you today. Thank you for allowing me to take part in your health care.  Our goals for today as we discussed include:  Referral placed for CT for lung cancer screening.  They will call you with an appointment  Please schedule an appointment for annual physical in May and a fasting lab appointment 1 week prior to the visit  Follow up with Infectious Disease as scheduled  Follow up with Orthopedics as scheduled   If you have any questions or concerns, please do not hesitate to call the office at [phone].  I look forward to our next visit and until then take care and stay safe.  Regards,   Carollee Leitz, MD   [facility]

## 2022-03-06 NOTE — Progress Notes (Addendum)
SUBJECTIVE:   Chief Complaint  Patient presents with   Establish Care    Transfer of Care   HPI Patient presents to clinic to transfer care.  No acute concerns today.  Hypertension Asymptomatic.  Currently takes amlodipine 2.5 mg daily and tolerating medication well.  Denies any headaches, visual changes, chest pain, shortness of breath, nausea/vomiting or lower extremity edema.  Positive RPR History of syphilis infection in past and early 20s and treated with shots.  Second infection in 40's and treated with Doxycycline 1 month.  01/17/22 positive RPR and titres 1:4.  Retreated with Doxycyline x 2 weeks. Reports sexual contact with patient who is syphilis positive.  02/21/22 repeat RPR positive, and titre unchanged.  Denies any symptoms.   HIV Doing well. Asymptomatic.  [place].  Currently on Biktarvy and tolerating well.  Follows with ID, Dr Demetrius Charity at [facility].  Reports CD4 count nonreactive.  PERTINENT PMH / PSH: Hypertension Type 2 diabetes HIV Treated Syphillis x 3 Positive RPR Chronic neutropenia Spinal cord stimulator  OBJECTIVE:  BP 112/72   Pulse 87   Temp 97.8 F (36.6 C)   Ht '5\' 11"'$  (1.803 m)   Wt 142 lb 6.4 oz (64.6 kg)   SpO2 99%   BMI 19.86 kg/m    Physical Exam Constitutional:      General: He is not in acute distress.    Appearance: He is normal weight. He is not ill-appearing.  HENT:     Head: Normocephalic.  Eyes:     Conjunctiva/sclera: Conjunctivae normal.  Cardiovascular:     Rate and Rhythm: Normal rate and regular rhythm.     Pulses: Normal pulses.  Pulmonary:     Effort: Pulmonary effort is normal.     Breath sounds: Normal breath sounds.  Abdominal:     General: Bowel sounds are normal.  Neurological:     Mental Status: He is alert. Mental status is at baseline.  Psychiatric:        Mood and Affect: Mood normal.        Behavior: Behavior normal.        Thought Content: Thought content normal.        Judgment:  Judgment normal.     ASSESSMENT/PLAN:  Hypertension associated with diabetes ([place]) Assessment & Plan: Chronic. Stable. Well controlled.  Recent creatinine within normal limits. -Continue Amlodipine 2.5 mg daily -Consider switching to ARB given history of diabetes.   Orders: -     Comprehensive metabolic panel; Future  Aortic atherosclerosis (HCC) Assessment & Plan: Chronic.  Stable.  Noted on CT scan 11/17.  Not currently at goal LDL less than 70.  Currently on statin therapy.  No myalgias Continue Pravachol 40 mg daily Repeat fasting lipids, if remains elevated increase  dose of statin    Orders: -     Lipid panel; Future  Other neutropenia (HCC) Assessment & Plan: Chronic.  Stable.  Asymptomatic. Follows with oncology at 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  Stable.  Currently on Breo Ellipta daily and albuterol inhaler as needed.  History of tobacco use. Low-dose CT chest for cancer last completed 2017 recommended 63-monthrepeat.  This was not completed.  Will send referral for    Controlled type 2 diabetes mellitus without complication, without long-term current use of insulin ([place] Assessment & Plan: Chronic.  Stable.  Diagnosed 05/22.  A1c at that time 6.5.  Currently asymptomatic.  Recent A1c 6.1. Not currently on ACE Urine  ACR at annual Repeat A1c Continue statin Recommend annual foot exam  Recommend diabetic eye exam    Orders: -     Hemoglobin A1c; Future -     Microalbumin / creatinine urine ratio; Future  Vitamin D deficiency -     VITAMIN D 25 Hydroxy (Vit-D Deficiency, Fractures); Future  Thyroid disorder screen -     TSH; Future  Low serum vitamin B12 -     Vitamin B12; Future  Positive RPR test Assessment & Plan: Chronic.  Stable.  History of syphilis and treated x 3.  Recent repeat titers remain positive.  Follows with infectious disease. Repeat titers at next visit.  If elevated greater than 1: 8,  symptomatic or contact with persons infected. Recommend condom use.  Patient currently abstinent.  [place].   Orders: -     RPR w/reflex to TrepSure; Future  Encounter for screening for lung cancer -     Ambulatory Referral for Lung Cancer Scre  Mixed hyperlipidemia Assessment & Plan: Chronic.  Stable.  On statin and tolerating well.  No myalgias. Continue Pravachol 40 mg daily Check lipids   Prostate cancer screening Assessment & Plan: Asymptomatic.   Orders: -     PSA; Future   PDMP reviewed  Return in about 5 months (around 08/05/2022) for annual visit with fasting labs 1 week prior.  Carollee Leitz, MD

## 2022-03-10 HISTORY — PX: OTHER SURGICAL HISTORY: SHX169

## 2022-03-12 ENCOUNTER — Telehealth: Payer: Self-pay

## 2022-03-12 NOTE — Telephone Encounter (Signed)
[facility] Department called & states that the Patient's baseline is 1-2 and it will always be that due to the long history of Samuel Burnett. [facility] states the Patient only needs to be treated if he has signs/ symptoms of Samuel Burnett, states he has been with a Samuel Burnett positive person or his titer comes back showing 1-8 instead of 1-2.

## 2022-03-12 NOTE — Telephone Encounter (Signed)
Phone call to [facility] in [place]. Requested to speak with nurse for this pt. Spoke with Benjamine Mola. Discussed pt hx of syphilis, tx, and pending TR from most recent 03/06/22 testing. Discussed that future tests will be reactive, but our providers look at titers to determine if four fold increase is present, and our providers also assess symptoms and any known new exposures. [facility] is available to discuss further or answer questions.

## 2022-03-20 ENCOUNTER — Encounter: Payer: Self-pay | Admitting: Family Medicine

## 2022-03-20 ENCOUNTER — Ambulatory Visit (INDEPENDENT_AMBULATORY_CARE_PROVIDER_SITE_OTHER): Payer: 59 | Admitting: Family Medicine

## 2022-03-20 VITALS — BP 127/93 | HR 69 | Temp 98.1°F | Ht 71.0 in | Wt 145.8 lb

## 2022-03-20 DIAGNOSIS — I152 Hypertension secondary to endocrine disorders: Secondary | ICD-10-CM

## 2022-03-20 DIAGNOSIS — A530 Latent syphilis, unspecified as early or late: Secondary | ICD-10-CM

## 2022-03-20 DIAGNOSIS — Z7252 High risk homosexual behavior: Secondary | ICD-10-CM

## 2022-03-20 DIAGNOSIS — E1159 Type 2 diabetes mellitus with other circulatory complications: Secondary | ICD-10-CM | POA: Diagnosis not present

## 2022-03-20 DIAGNOSIS — A528 Late syphilis, latent: Secondary | ICD-10-CM

## 2022-03-20 HISTORY — DX: Late syphilis, latent: A52.8

## 2022-03-20 NOTE — Progress Notes (Addendum)
   SUBJECTIVE:   Chief Complaint  Patient presents with   Acute Visit    Discuss syphilis titer   HPI Patient presents to clinic to discuss recent results of RPR.  Has not had sexual encounters since last visit on 12/28.  Previously discussed positive RPR to titers and management with patient.  Not currently having any symptoms.    PERTINENT PMH / PSH: HIV antiviral therapy History of syphilis x 3 and treated.  OBJECTIVE:  BP (!) 127/93   Pulse 69   Temp 98.1 F (36.7 C) (Oral)   Ht '5\' 11"'$  (1.803 m)   Wt 145 lb 12.8 oz (66.1 kg)   SpO2 98%   BMI 20.33 kg/m    Physical Exam Vitals reviewed.  Constitutional:      Appearance: Normal appearance. He is normal weight. He is not ill-appearing or toxic-appearing.  Eyes:     Conjunctiva/sclera: Conjunctivae normal.  Cardiovascular:     Rate and Rhythm: Normal rate.  Pulmonary:     Effort: Pulmonary effort is normal.  Neurological:     Mental Status: He is alert. Mental status is at baseline.  Psychiatric:        Mood and Affect: Mood normal.        Behavior: Behavior normal.        Thought Content: Thought content normal.        Judgment: Judgment normal.     ASSESSMENT/PLAN:  Positive RPR test Assessment & Plan: Chronic.  Stable.   Reports no recent contact with infected person since last visit 2 weeks ago. Per [facility] no indication to repeat RPR given chronic history and previously treated. Encourage condom use      Hypertension associated with diabetes [facility]) Assessment & Plan: Chronic. Stable. Slightly elevated today -Continue Amlodipine 2.5 mg daily -Consider switching to ARB given history of diabetes. -Plan to recheck at next visit     PDMP reviewed  Return if symptoms worsen or fail to improve.  Carollee Leitz, MD

## 2022-03-22 ENCOUNTER — Encounter: Payer: Self-pay | Admitting: Family Medicine

## 2022-03-22 DIAGNOSIS — E538 Deficiency of other specified B group vitamins: Secondary | ICD-10-CM | POA: Insufficient documentation

## 2022-03-22 DIAGNOSIS — E118 Type 2 diabetes mellitus with unspecified complications: Secondary | ICD-10-CM | POA: Insufficient documentation

## 2022-03-22 DIAGNOSIS — Z122 Encounter for screening for malignant neoplasm of respiratory organs: Secondary | ICD-10-CM

## 2022-03-22 DIAGNOSIS — E119 Type 2 diabetes mellitus without complications: Secondary | ICD-10-CM | POA: Insufficient documentation

## 2022-03-22 DIAGNOSIS — Z1329 Encounter for screening for other suspected endocrine disorder: Secondary | ICD-10-CM | POA: Insufficient documentation

## 2022-03-22 HISTORY — DX: Encounter for screening for malignant neoplasm of respiratory organs: Z12.2

## 2022-03-22 NOTE — Assessment & Plan Note (Addendum)
Chronic.  Stable.  Asymptomatic. Follows with oncology at [facility]

## 2022-03-22 NOTE — Assessment & Plan Note (Signed)
Chronic.  Stable.  Currently on Breo Ellipta daily and albuterol inhaler as needed.  History of tobacco use. Low-dose CT chest for cancer last completed 2017 recommended 69-monthrepeat.  This was not completed.  Will send referral for

## 2022-03-22 NOTE — Assessment & Plan Note (Addendum)
Chronic. Stable. Well controlled.  Recent creatinine within normal limits. -Continue Amlodipine 5 mg daily -Consider switching to ARB given history of diabetes.

## 2022-03-22 NOTE — Assessment & Plan Note (Signed)
Chronic.  Stable.  History of syphilis and treated x 3.  Recent repeat titers remain positive.  Follows with infectious disease. Repeat titers at next visit.  If elevated greater than 1: 8, symptomatic or contact with persons infected. Recommend condom use.  Patient currently abstinent.  [place].

## 2022-03-22 NOTE — Assessment & Plan Note (Signed)
Chronic.  Stable.  On statin and tolerating well.  No myalgias. Continue Pravachol 40 mg daily Check lipids

## 2022-03-22 NOTE — Assessment & Plan Note (Addendum)
Chronic.  Stable.  Diagnosed 05/22.  A1c at that time 6.5.  Currently asymptomatic.  Recent A1c 6.1. Not currently on ACE Urine ACR at annual Repeat A1c Continue statin Recommend annual foot exam  Recommend diabetic eye exam

## 2022-03-22 NOTE — Assessment & Plan Note (Addendum)
Chronic.  Stable.  Noted on CT scan 11/17.  Not currently at goal LDL less than 70.  Currently on statin therapy.  No myalgias Continue Pravachol 40 mg daily Repeat fasting lipids, if remains elevated increase  dose of statin

## 2022-03-22 NOTE — Assessment & Plan Note (Signed)
Asymptomatic. 

## 2022-03-27 ENCOUNTER — Encounter: Payer: Self-pay | Admitting: Family Medicine

## 2022-03-27 NOTE — Assessment & Plan Note (Addendum)
Chronic. Stable. Slightly elevated today -Continue Amlodipine 2.5 mg daily -Consider switching to ARB given history of diabetes. -Plan to recheck at next visit

## 2022-03-27 NOTE — Assessment & Plan Note (Addendum)
Chronic.  Stable.   Reports no recent contact with infected person since last visit 2 weeks ago. Per [place] health department no indication to repeat RPR given chronic history and previously treated. Encourage condom use

## 2022-03-27 NOTE — Patient Instructions (Signed)
It was a pleasure meeting you today. Thank you for allowing me to take part in your health care.  Our goals for today as we discussed include:  As we have previously discussed at our recent visit 1 week ago we do not need to repeat your syphilis testing today given that you have been treated and have not had any repeat contact or are currently symptomatic.  Your RPR may continue to remain at this level.  If you have any future contact with someone who has been infected with syphilis this would indicate need for retesting and treatment.  Recommend continued condom use for all sexual encounters or abstinence.  If you have any questions or concerns, please do not hesitate to call the office at [phone].  I look forward to our next visit and until then take care and stay safe.  Regards,   Carollee Leitz, MD   [facility]

## 2022-04-17 DIAGNOSIS — Z23 Encounter for immunization: Secondary | ICD-10-CM | POA: Diagnosis not present

## 2022-04-17 DIAGNOSIS — Z862 Personal history of diseases of the blood and blood-forming organs and certain disorders involving the immune mechanism: Secondary | ICD-10-CM | POA: Diagnosis not present

## 2022-04-17 DIAGNOSIS — Z87891 Personal history of nicotine dependence: Secondary | ICD-10-CM | POA: Diagnosis not present

## 2022-04-17 DIAGNOSIS — Z79899 Other long term (current) drug therapy: Secondary | ICD-10-CM | POA: Diagnosis not present

## 2022-04-17 DIAGNOSIS — Z720 Tobacco use: Secondary | ICD-10-CM | POA: Diagnosis not present

## 2022-06-30 ENCOUNTER — Other Ambulatory Visit: Payer: Self-pay

## 2022-06-30 DIAGNOSIS — D509 Iron deficiency anemia, unspecified: Secondary | ICD-10-CM

## 2022-07-01 ENCOUNTER — Encounter: Payer: Self-pay | Admitting: Oncology

## 2022-07-01 ENCOUNTER — Inpatient Hospital Stay: Payer: 59 | Attending: Oncology

## 2022-07-01 ENCOUNTER — Inpatient Hospital Stay (HOSPITAL_BASED_OUTPATIENT_CLINIC_OR_DEPARTMENT_OTHER): Payer: 59 | Admitting: Oncology

## 2022-07-01 VITALS — BP 136/105 | HR 72 | Temp 98.1°F | Resp 18 | Ht 72.0 in | Wt 138.7 lb

## 2022-07-01 DIAGNOSIS — Z87891 Personal history of nicotine dependence: Secondary | ICD-10-CM | POA: Diagnosis not present

## 2022-07-01 DIAGNOSIS — D708 Other neutropenia: Secondary | ICD-10-CM

## 2022-07-01 DIAGNOSIS — I10 Essential (primary) hypertension: Secondary | ICD-10-CM | POA: Diagnosis not present

## 2022-07-01 DIAGNOSIS — D709 Neutropenia, unspecified: Secondary | ICD-10-CM | POA: Diagnosis not present

## 2022-07-01 DIAGNOSIS — D509 Iron deficiency anemia, unspecified: Secondary | ICD-10-CM

## 2022-07-01 DIAGNOSIS — Z21 Asymptomatic human immunodeficiency virus [HIV] infection status: Secondary | ICD-10-CM | POA: Diagnosis not present

## 2022-07-01 LAB — CBC WITH DIFFERENTIAL (CANCER CENTER ONLY)
Abs Immature Granulocytes: 0.01 10*3/uL (ref 0.00–0.07)
Basophils Absolute: 0.1 10*3/uL (ref 0.0–0.1)
Basophils Relative: 1 %
Eosinophils Absolute: 0.2 10*3/uL (ref 0.0–0.5)
Eosinophils Relative: 4 %
HCT: 45.4 % (ref 39.0–52.0)
Hemoglobin: 15.8 g/dL (ref 13.0–17.0)
Immature Granulocytes: 0 %
Lymphocytes Relative: 57 %
Lymphs Abs: 2.9 10*3/uL (ref 0.7–4.0)
MCH: 32.7 pg (ref 26.0–34.0)
MCHC: 34.8 g/dL (ref 30.0–36.0)
MCV: 94 fL (ref 80.0–100.0)
Monocytes Absolute: 0.5 10*3/uL (ref 0.1–1.0)
Monocytes Relative: 9 %
Neutro Abs: 1.5 10*3/uL — ABNORMAL LOW (ref 1.7–7.7)
Neutrophils Relative %: 29 %
Platelet Count: 210 10*3/uL (ref 150–400)
RBC: 4.83 MIL/uL (ref 4.22–5.81)
RDW: 14.2 % (ref 11.5–15.5)
WBC Count: 5.1 10*3/uL (ref 4.0–10.5)
nRBC: 0 % (ref 0.0–0.2)

## 2022-07-01 NOTE — Progress Notes (Signed)
No concerns. 
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
## 2022-07-23 ENCOUNTER — Emergency Department: Payer: Worker's Compensation

## 2022-07-23 ENCOUNTER — Encounter: Payer: Self-pay | Admitting: Emergency Medicine

## 2022-07-23 ENCOUNTER — Emergency Department
Admission: EM | Admit: 2022-07-23 | Discharge: 2022-07-23 | Disposition: A | Discharge status: Home / Self Care | Payer: Worker's Compensation | Attending: Emergency Medicine | Admitting: Emergency Medicine

## 2022-07-23 ENCOUNTER — Other Ambulatory Visit: Payer: Self-pay

## 2022-07-23 DIAGNOSIS — J45909 Unspecified asthma, uncomplicated: Secondary | ICD-10-CM | POA: Insufficient documentation

## 2022-07-23 DIAGNOSIS — S20419A Abrasion of unspecified back wall of thorax, initial encounter: Secondary | ICD-10-CM | POA: Insufficient documentation

## 2022-07-23 DIAGNOSIS — S29002A Unspecified injury of muscle and tendon of back wall of thorax, initial encounter: Secondary | ICD-10-CM | POA: Diagnosis present

## 2022-07-23 DIAGNOSIS — J449 Chronic obstructive pulmonary disease, unspecified: Secondary | ICD-10-CM | POA: Diagnosis not present

## 2022-07-23 DIAGNOSIS — S3992XA Unspecified injury of lower back, initial encounter: Secondary | ICD-10-CM | POA: Diagnosis not present

## 2022-07-23 DIAGNOSIS — S0990XA Unspecified injury of head, initial encounter: Secondary | ICD-10-CM | POA: Insufficient documentation

## 2022-07-23 DIAGNOSIS — Z21 Asymptomatic human immunodeficiency virus [HIV] infection status: Secondary | ICD-10-CM | POA: Diagnosis not present

## 2022-07-23 DIAGNOSIS — N50811 Right testicular pain: Secondary | ICD-10-CM | POA: Diagnosis not present

## 2022-07-23 DIAGNOSIS — S199XXA Unspecified injury of neck, initial encounter: Secondary | ICD-10-CM | POA: Diagnosis not present

## 2022-07-23 DIAGNOSIS — M25511 Pain in right shoulder: Secondary | ICD-10-CM | POA: Diagnosis not present

## 2022-07-23 DIAGNOSIS — M7918 Myalgia, other site: Secondary | ICD-10-CM

## 2022-07-23 DIAGNOSIS — Y990 Civilian activity done for income or pay: Secondary | ICD-10-CM | POA: Diagnosis not present

## 2022-07-23 DIAGNOSIS — N50812 Left testicular pain: Secondary | ICD-10-CM | POA: Insufficient documentation

## 2022-07-23 DIAGNOSIS — M545 Low back pain, unspecified: Secondary | ICD-10-CM | POA: Diagnosis not present

## 2022-07-23 DIAGNOSIS — S299XXA Unspecified injury of thorax, initial encounter: Secondary | ICD-10-CM | POA: Diagnosis not present

## 2022-07-23 MED ORDER — ACETAMINOPHEN 500 MG PO TABS: 1000.0000 mg | ORAL_TABLET | Freq: Once | ORAL | Status: DC

## 2022-07-23 MED ORDER — CYCLOBENZAPRINE HCL 10 MG PO TABS: 10.0000 mg | ORAL_TABLET | Freq: Three times a day (TID) | ORAL | 0 refills | Status: DC | PRN
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
## 2022-07-23 NOTE — ED Notes (Signed)
Patient transported to CT 

## 2022-07-23 NOTE — ED Notes (Addendum)
This RN in as CT tech goes in to get patient; patient asking about something for pain, stated that I just saw the order for Tylenol, asked if he would like it prior to going to CT.  Patient states, "If its Aspirin or Tylenol, I can't have that because I'm allergic to it.  Its on my chart."  This RN explained that Tylenol and Aspirin are not the same; patient states, "Just forget it, I dont want it, just take me to CT."

## 2022-07-23 NOTE — ED Notes (Signed)
US at bedside
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
## 2022-07-24 ENCOUNTER — Encounter: Payer: Self-pay | Admitting: Emergency Medicine

## 2022-07-28 ENCOUNTER — Other Ambulatory Visit: Payer: Self-pay

## 2022-07-28 DIAGNOSIS — G2581 Restless legs syndrome: Secondary | ICD-10-CM

## 2022-07-28 DIAGNOSIS — I252 Old myocardial infarction: Secondary | ICD-10-CM

## 2022-07-28 DIAGNOSIS — E782 Mixed hyperlipidemia: Secondary | ICD-10-CM

## 2022-07-28 MED ORDER — ROPINIROLE HCL 1 MG PO TABS
1.0000 mg | ORAL_TABLET | Freq: Every day | ORAL | 1 refills | Status: DC
Start: 2022-07-28 — End: 2022-10-02

## 2022-07-28 MED ORDER — AMLODIPINE BESYLATE 5 MG PO TABS: 5.0000 mg | ORAL_TABLET | Freq: Every day | ORAL | 1 refills | Status: DC

## 2022-07-28 MED ORDER — PRAVASTATIN SODIUM 40 MG PO TABS
ORAL_TABLET | ORAL | 3 refills | Status: DC
Start: 2022-07-28 — End: 2023-04-07

## 2022-07-29 ENCOUNTER — Other Ambulatory Visit (INDEPENDENT_AMBULATORY_CARE_PROVIDER_SITE_OTHER): Payer: 59

## 2022-07-29 DIAGNOSIS — E119 Type 2 diabetes mellitus without complications: Secondary | ICD-10-CM | POA: Diagnosis not present

## 2022-07-29 DIAGNOSIS — Z125 Encounter for screening for malignant neoplasm of prostate: Secondary | ICD-10-CM | POA: Diagnosis not present

## 2022-07-29 DIAGNOSIS — Z1329 Encounter for screening for other suspected endocrine disorder: Secondary | ICD-10-CM

## 2022-07-29 DIAGNOSIS — E538 Deficiency of other specified B group vitamins: Secondary | ICD-10-CM

## 2022-07-29 DIAGNOSIS — I152 Hypertension secondary to endocrine disorders: Secondary | ICD-10-CM | POA: Diagnosis not present

## 2022-07-29 DIAGNOSIS — I700 Atherosclerosis of aorta: Secondary | ICD-10-CM

## 2022-07-29 DIAGNOSIS — A530 Latent syphilis, unspecified as early or late: Secondary | ICD-10-CM

## 2022-07-29 DIAGNOSIS — E1159 Type 2 diabetes mellitus with other circulatory complications: Secondary | ICD-10-CM | POA: Diagnosis not present

## 2022-07-29 DIAGNOSIS — E559 Vitamin D deficiency, unspecified: Secondary | ICD-10-CM

## 2022-07-29 LAB — COMPREHENSIVE METABOLIC PANEL
ALT: 12 U/L (ref 0–53)
AST: 19 U/L (ref 0–37)
Albumin: 4.1 g/dL (ref 3.5–5.2)
Alkaline Phosphatase: 84 U/L (ref 39–117)
BUN: 16 mg/dL (ref 6–23)
CO2: 29 mEq/L (ref 19–32)
Calcium: 9.9 mg/dL (ref 8.4–10.5)
Chloride: 104 mEq/L (ref 96–112)
Creatinine, Ser: 0.94 mg/dL (ref 0.40–1.50)
GFR: 86.01 mL/min (ref >60.00)
Glucose, Bld: 90 mg/dL (ref 70–99)
Potassium: 4.5 mEq/L (ref 3.5–5.1)
Sodium: 140 mEq/L (ref 135–145)
Total Bilirubin: 0.4 mg/dL (ref 0.2–1.2)
Total Protein: 6.5 g/dL (ref 6.0–8.3)

## 2022-07-29 LAB — VITAMIN B12: Vitamin B-12: 468 pg/mL (ref 211–911)

## 2022-07-29 LAB — PSA: PSA: 1.06 ng/mL (ref 0.10–4.00)

## 2022-07-29 LAB — LIPID PANEL
Cholesterol: 173 mg/dL (ref 0–200)
HDL: 63 mg/dL (ref >39.00)
LDL Cholesterol: 93 mg/dL (ref 0–99)
NonHDL: 109.68
Total CHOL/HDL Ratio: 3
Triglycerides: 81 mg/dL (ref 0.0–149.0)
VLDL: 16.2 mg/dL (ref 0.0–40.0)

## 2022-07-29 LAB — HEMOGLOBIN A1C: Hgb A1c MFr Bld: 6.1 % (ref 4.6–6.5)

## 2022-07-29 LAB — MICROALBUMIN / CREATININE URINE RATIO
Creatinine,U: 119.1 mg/dL
Microalb Creat Ratio: 3.5 mg/g (ref 0.0–30.0)
Microalb, Ur: 4.2 mg/dL — ABNORMAL HIGH (ref 0.0–1.9)

## 2022-07-29 LAB — TSH: TSH: 3.21 u[IU]/mL (ref 0.35–5.50)

## 2022-07-29 LAB — VITAMIN D 25 HYDROXY (VIT D DEFICIENCY, FRACTURES): VITD: 43.68 ng/mL (ref 30.00–100.00)

## 2022-07-29 NOTE — Addendum Note (Signed)
Addended by: Warden Fillers on: 07/29/2022 09:01 AM   Modules accepted: Orders

## 2022-07-30 LAB — RPR W/REFLEX TO TREPSURE

## 2022-07-31 LAB — RPR, QUANT: RPR, Quant: 1:2 {titer} — ABNORMAL HIGH
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
## 2022-08-05 ENCOUNTER — Ambulatory Visit: Payer: 59 | Admitting: Family Medicine

## 2022-08-05 DIAGNOSIS — I152 Hypertension secondary to endocrine disorders: Secondary | ICD-10-CM

## 2022-08-13 ENCOUNTER — Other Ambulatory Visit: Payer: Self-pay | Admitting: Emergency Medicine

## 2022-08-13 DIAGNOSIS — F17210 Nicotine dependence, cigarettes, uncomplicated: Secondary | ICD-10-CM

## 2022-08-13 DIAGNOSIS — Z87891 Personal history of nicotine dependence: Secondary | ICD-10-CM

## 2022-08-13 DIAGNOSIS — Z122 Encounter for screening for malignant neoplasm of respiratory organs: Secondary | ICD-10-CM

## 2022-08-20 ENCOUNTER — Ambulatory Visit (INDEPENDENT_AMBULATORY_CARE_PROVIDER_SITE_OTHER): Payer: 59 | Admitting: Family Medicine

## 2022-08-20 ENCOUNTER — Encounter: Payer: Self-pay | Admitting: Family Medicine

## 2022-08-20 VITALS — BP 114/74 | HR 75 | Temp 97.5°F | Ht 72.0 in | Wt 137.0 lb

## 2022-08-20 DIAGNOSIS — E118 Type 2 diabetes mellitus with unspecified complications: Secondary | ICD-10-CM | POA: Diagnosis not present

## 2022-08-20 DIAGNOSIS — I152 Hypertension secondary to endocrine disorders: Secondary | ICD-10-CM

## 2022-08-20 DIAGNOSIS — E1169 Type 2 diabetes mellitus with other specified complication: Secondary | ICD-10-CM

## 2022-08-20 DIAGNOSIS — E785 Hyperlipidemia, unspecified: Secondary | ICD-10-CM

## 2022-08-20 DIAGNOSIS — I700 Atherosclerosis of aorta: Secondary | ICD-10-CM | POA: Diagnosis not present

## 2022-08-20 DIAGNOSIS — E1159 Type 2 diabetes mellitus with other circulatory complications: Secondary | ICD-10-CM

## 2022-08-20 DIAGNOSIS — E119 Type 2 diabetes mellitus without complications: Secondary | ICD-10-CM

## 2022-08-20 NOTE — Patient Instructions (Addendum)
It was a pleasure meeting you today. Thank you for allowing me to take part in your health care.  Our goals for today as we discussed include:  Increase water intake  Follow up with Workers Comp as scheduled  Foot exam completed today  Schedule Eye exam and have results forwarded to clinic.  Due 01/2023  Schedule Medicare Annual Wellness Visit   Follow up in 1 year or sooner if needed  If you have any questions or concerns, please do not hesitate to call the office at [phone].  I look forward to our next visit and until then take care and stay safe.  Regards,   Dana Allan, MD   [facility]
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
## 2022-08-30 ENCOUNTER — Encounter: Payer: Self-pay | Admitting: Family Medicine

## 2022-08-30 MED ORDER — EZETIMIBE 10 MG PO TABS: 10.0000 mg | ORAL_TABLET | Freq: Every day | ORAL | 3 refills | Status: DC

## 2022-08-30 NOTE — Assessment & Plan Note (Signed)
Chronic.  Stable.  On statin and tolerating well.  No myalgias. LDL not at goal <70.  Has not been taking Zetia Restart Zetia 10 mg daily Continue Pravachol 40 mg daily

## 2022-08-30 NOTE — Assessment & Plan Note (Signed)
Chronic. Well controlled -Continue Amlodipine 2.5 mg daily -Consider switching to ARB given history of diabetes. Not interested in changing medications today.
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
## 2022-08-30 NOTE — Assessment & Plan Note (Addendum)
Chronic.  Stable.  Diagnosed 05/22.  A1c at that time 6.5.  Currently asymptomatic.  Recent A1c 6.1. Microalbuminuria noted.  Does not want to start ARB BP well controlled on current CCB Continue statin Foot exam completed today Recommend diabetic eye exam

## 2022-09-08 ENCOUNTER — Other Ambulatory Visit: Payer: Self-pay

## 2022-09-08 DIAGNOSIS — G8929 Other chronic pain: Secondary | ICD-10-CM

## 2022-09-08 DIAGNOSIS — G4700 Insomnia, unspecified: Secondary | ICD-10-CM

## 2022-09-08 MED ORDER — TRAZODONE HCL 50 MG PO TABS
50.0000 mg | ORAL_TABLET | Freq: Every evening | ORAL | 3 refills | Status: DC | PRN
Start: 2022-09-08 — End: 2023-07-10

## 2022-09-08 MED ORDER — DULOXETINE HCL 60 MG PO CPEP: 60.0000 mg | ORAL_CAPSULE | Freq: Every day | ORAL | 3 refills | Status: DC

## 2022-09-08 NOTE — Telephone Encounter (Signed)
Previously filled by [employer]

## 2022-09-10 ENCOUNTER — Encounter: Payer: 59 | Admitting: Acute Care

## 2022-09-10 ENCOUNTER — Telehealth: Payer: Self-pay | Admitting: Acute Care

## 2022-09-10 NOTE — Telephone Encounter (Signed)
Pt. Was scheduled for a sheared decision making visit 7/3 at 9 am. There was no answer upon calling x 3 during the 30 minute visit time. There was no option to leave a message, as the phone would go from ringing to a fast busy signal. We will have to cancel the CT Chest scheduled for 7/5 and reschedule both the [facility] and the CT Chest.

## 2022-09-12 ENCOUNTER — Ambulatory Visit: Payer: 59

## 2022-09-15 ENCOUNTER — Telehealth: Payer: Self-pay

## 2022-09-15 NOTE — Telephone Encounter (Signed)
Returned call from VM requesting to reschedule sdmv and LDCT. Number to return call was noted as [phone] which does not match home number.  Attempt to call. No answer. Left VM and call back information

## 2022-09-25 NOTE — Telephone Encounter (Signed)
Called patient from VM message. Same number as previous call.  No answer. Rang several times then message said call couldn't be completed.

## 2022-10-01 ENCOUNTER — Ambulatory Visit: Payer: 59 | Admitting: *Deleted

## 2022-10-01 ENCOUNTER — Other Ambulatory Visit: Payer: Self-pay | Admitting: Family Medicine

## 2022-10-01 ENCOUNTER — Telehealth: Payer: Self-pay | Admitting: Family Medicine

## 2022-10-01 VITALS — Ht 72.0 in | Wt 132.0 lb

## 2022-10-01 DIAGNOSIS — G2581 Restless legs syndrome: Secondary | ICD-10-CM

## 2022-10-01 DIAGNOSIS — Z0000 Encounter for general adult medical examination without abnormal findings: Secondary | ICD-10-CM | POA: Diagnosis not present
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
## 2022-10-01 NOTE — Patient Instructions (Signed)
Per patient no change in vitals since last visit; unable to obtain new vitals due to this being a telehealth visit. Patient stated that he has the equipment but not sure how to use it and will bring at next visit. Samuel Burnett , Thank you for taking time to come for your Medicare Wellness Visit. I appreciate your ongoing commitment to your health goals. Please review the following plan we discussed and let me know if I can assist you in the future.   These are the goals we discussed:  Goals       Follow up with Primary Care Provider      As needed      Patient Stated      Wants to work on gaining weight      Weight goal 170lb (pt-stated)      Stay active High protein diet Stay hydrated        This is a list of the screening recommended for you and due dates:  Health Maintenance  Topic Date Due   COVID-19 Vaccine (1) Never done   Screening for Lung Cancer  09/22/2017   Eye exam for diabetics  07/30/2022   Flu Shot  10/09/2022   Hemoglobin A1C  01/29/2023   Yearly kidney function blood test for diabetes  07/29/2023   Yearly kidney health urinalysis for diabetes  07/29/2023   Complete foot exam   08/20/2023   Medicare Annual Wellness Visit  10/01/2023   DTaP/Tdap/Td vaccine (3 - Td or Tdap) 12/19/2025   Colon Cancer Screening  06/20/2028   Hepatitis C Screening  Completed   HIV Screening  Completed   Zoster (Shingles) Vaccine  Completed   HPV Vaccine  Aged Out    Advanced directives: Will work on this  Conditions/risks identified: none  Next appointment: Follow up in one year for your annual wellness visit 10/05/23 @1 :45  Preventive Care 40-64 Years, Male Preventive care refers to lifestyle choices and visits with your health care provider that can promote health and wellness. What does preventive care include? A yearly physical exam. This is also called an annual well check. Dental exams once or twice a year. Routine eye exams. Ask your health care provider how often  you should have your eyes checked. Personal lifestyle choices, including: Daily care of your teeth and gums. Regular physical activity. Eating a healthy diet. Avoiding tobacco and drug use. Limiting alcohol use. Practicing safe sex. Taking low-dose aspirin every day starting at age 38. What happens during an annual well check? The services and screenings done by your health care provider during your annual well check will depend on your age, overall health, lifestyle risk factors, and family history of disease. Counseling  Your health care provider may ask you questions about your: Alcohol use. Tobacco use. Drug use. Emotional well-being. Home and relationship well-being. Sexual activity. Eating habits. Work and work Astron[employer]. Screening  You may have the following tests or measurements: Height, weight, and BMI. Blood pressure. Lipid and cholesterol levels. These may be checked every 5 years, or more frequently if you are over 74 years old. Skin check. Lung cancer screening. You may have this screening every year starting at age 46 if you have a 30-pack-year history of smoking and currently smoke or have quit within the past 15 years. Fecal occult blood test (FOBT) of the stool. You may have this test every year starting at age 15. Flexible sigmoidoscopy or colonoscopy. You may have a sigmoidoscopy every 5 years or  a colonoscopy every 10 years starting at age 70. Prostate cancer screening. Recommendations will vary depending on your family history and other risks. Hepatitis C blood test. Hepatitis B blood test. Sexually transmitted disease (STD) testing. Diabetes screening. This is done by checking your blood sugar (glucose) after you have not eaten for a while (fasting). You may have this done every 1-3 years. Discuss your test results, treatment options, and if necessary, the need for more tests with your health care provider. Vaccines  Your health care provider may recommend  certain vaccines, such as: Influenza vaccine. This is recommended every year. Tetanus, diphtheria, and acellular pertussis (Tdap, Td) vaccine. You may need a Td booster every 10 years. Zoster vaccine. You may need this after age 83. Pneumococcal 13-valent conjugate (PCV13) vaccine. You may need this if you have certain conditions and have not been vaccinated. Pneumococcal polysaccharide (PPSV23) vaccine. You may need one or two doses if you smoke cigarettes or if you have certain conditions. Talk to your health care provider about which screenings and vaccines you need and how often you need them. This information is not intended to replace advice given to you by your health care provider. Make sure you discuss any questions you have with your health care provider. Document Released: 03/23/2015 Document Revised: 11/14/2015 Document Reviewed: 12/26/2014 Elsevier Interactive Patient Education  2017 [employer].  Fall Prevention in the Home Falls can cause injuries. They can happen to people of all ages. There are many things you can do to make your home safe and to help prevent falls. What can I do on the outside of my home? Regularly fix the edges of walkways and driveways and fix any cracks. Remove anything that might make you trip as you walk through a door, such as a raised step or threshold. Trim any bushes or trees on the path to your home. Use bright outdoor lighting. Clear any walking paths of anything that might make someone trip, such as rocks or tools. Regularly check to see if handrails are loose or broken. Make sure that both sides of any steps have handrails. Any raised decks and porches should have guardrails on the edges. Have any leaves, snow, or ice cleared regularly. Use sand or salt on walking paths during winter. Clean up any spills in your garage right away. This includes oil or grease spills. What can I do in the bathroom? Use night lights. Install grab bars by the  toilet and in the tub and shower. Do not use towel bars as grab bars. Use non-skid mats or decals in the tub or shower. If you need to sit down in the shower, use a plastic, non-slip stool. Keep the floor dry. Clean up any water that spills on the floor as soon as it happens. Remove soap buildup in the tub or shower regularly. Attach bath mats securely with double-sided non-slip rug tape. Do not have throw rugs and other things on the floor that can make you trip. What can I do in the bedroom? Use night lights. Make sure that you have a light by your bed that is easy to reach. Do not use any sheets or blankets that are too big for your bed. They should not hang down onto the floor. Have a firm chair that has side arms. You can use this for support while you get dressed. Do not have throw rugs and other things on the floor that can make you trip. What can I do in the kitchen?  Clean up any spills right away. Avoid walking on wet floors. Keep items that you use a lot in easy-to-reach places. If you need to reach something above you, use a strong step stool that has a grab bar. Keep electrical cords out of the way. Do not use floor polish or wax that makes floors slippery. If you must use wax, use non-skid floor wax. Do not have throw rugs and other things on the floor that can make you trip. What can I do with my stairs? Do not leave any items on the stairs. Make sure that there are handrails on both sides of the stairs and use them. Fix handrails that are broken or loose. Make sure that handrails are as long as the stairways. Check any carpeting to make sure that it is firmly attached to the stairs. Fix any carpet that is loose or worn. Avoid having throw rugs at the top or bottom of the stairs. If you do have throw rugs, attach them to the floor with carpet tape. Make sure that you have a light switch at the top of the stairs and the bottom of the stairs. If you do not have them, ask someone  to add them for you. What else can I do to help prevent falls? Wear shoes that: Do not have high heels. Have rubber bottoms. Are comfortable and fit you well. Are closed at the toe. Do not wear sandals. If you use a stepladder: Make sure that it is fully opened. Do not climb a closed stepladder. Make sure that both sides of the stepladder are locked into place. Ask someone to hold it for you, if possible. Clearly mark and make sure that you can see: Any grab bars or handrails. First and last steps. Where the edge of each step is. Use tools that help you move around (mobility aids) if they are needed. These include: Canes. Walkers. Scooters. Crutches. Turn on the lights when you go into a dark area. Replace any light bulbs as soon as they burn out. Set up your furniture so you have a clear path. Avoid moving your furniture around. If any of your floors are uneven, fix them. If there are any pets around you, be aware of where they are. Review your medicines with your doctor. Some medicines can make you feel dizzy. This can increase your chance of falling. Ask your doctor what other things that you can do to help prevent falls. This information is not intended to replace advice given to you by your health care provider. Make sure you discuss any questions you have with your health care provider. Document Released: 12/21/2008 Document Revised: 08/02/2015 Document Reviewed: 03/31/2014 Elsevier Interactive Patient Education  2017 [employer].

## 2022-10-01 NOTE — Telephone Encounter (Signed)
Copied from CRM [id number]. Topic: Medicare AWV >> Oct 01, 2022  9:16 AM Payton Doughty wrote: Reason for CRM: LM 10/01/2022 to schedule AWV   Verlee Rossetti; Care Guide Ambulatory Clinical Support [place] [facility] Direct Dial: [phone]
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
## 2022-10-06 ENCOUNTER — Other Ambulatory Visit: Payer: Self-pay | Admitting: *Deleted

## 2022-10-06 DIAGNOSIS — E1169 Type 2 diabetes mellitus with other specified complication: Secondary | ICD-10-CM

## 2022-10-06 MED ORDER — EZETIMIBE 10 MG PO TABS
10.0000 mg | ORAL_TABLET | Freq: Every day | ORAL | 2 refills | Status: DC
Start: 2022-10-06 — End: 2023-05-28

## 2022-10-08 ENCOUNTER — Encounter: Payer: Self-pay | Admitting: Family Medicine

## 2022-10-08 ENCOUNTER — Ambulatory Visit (INDEPENDENT_AMBULATORY_CARE_PROVIDER_SITE_OTHER): Payer: 59 | Admitting: Family Medicine

## 2022-10-08 VITALS — BP 128/88 | HR 79 | Temp 98.0°F | Resp 16 | Ht 72.0 in | Wt 133.4 lb

## 2022-10-08 DIAGNOSIS — L729 Follicular cyst of the skin and subcutaneous tissue, unspecified: Secondary | ICD-10-CM | POA: Diagnosis not present

## 2022-10-08 DIAGNOSIS — L600 Ingrowing nail: Secondary | ICD-10-CM

## 2022-10-08 NOTE — Patient Instructions (Signed)
It was a pleasure meeting you today. Thank you for allowing me to take part in your health care.  Our goals for today as we discussed include:  Continue warm water soaks frequently for right toenail  I do not see anything on exam on your left buttock. If it returns please schedule appointment so I can visualize and evaluate.   Follow up as needed  If you have any questions or concerns, please do not hesitate to call the office at [phone].  I look forward to our next visit and until then take care and stay safe.  Regards,   Dana Allan, MD   [facility]

## 2022-10-08 NOTE — Progress Notes (Signed)
SUBJECTIVE:   Chief Complaint  Patient presents with   Nail Problem    Big right toe    Cyst    On the left side on the butt   HPI Right toenail ingrown.  Has been soaking frequently in warm water and using mupirocin ointment. No fevers, drainage. Pain has resolved.  Reports intermittent cyst on left inner buttock. Has had some discomfort when cyst present.  Not currently bothering him today but would like checked.  Denies any fevers, drainage or worsening pain, no diarrhea, constipation or bloody stool.  PERTINENT PMH / PSH: Positive RPR treated  OBJECTIVE:  BP 128/88   Pulse 79   Temp 98 F (36.7 C)   Resp 16   Ht 6' (1.829 m)   Wt 133 lb 6 oz (60.5 kg)   SpO2 97%   BMI 18.09 kg/m    Physical Exam Constitutional:      General: He is not in acute distress.    Appearance: Normal appearance. He is normal weight. He is not ill-appearing, toxic-appearing or diaphoretic.  HENT:     Mouth/Throat:     Mouth: Mucous membranes are moist.  Eyes:     General:        Right eye: No discharge.        Left eye: No discharge.  Cardiovascular:     Rate and Rhythm: Normal rate.  Pulmonary:     Effort: Pulmonary effort is normal.  Genitourinary:    Rectum: Normal. No mass, tenderness or external hemorrhoid.     Comments: No lesions, masses, lumps, nodules noted on left gluteal fold or rectal area No edema, erythema, tenderness appreciated on palpation of left gluteal fold or sacral area  Musculoskeletal:        General: Normal range of motion.     Cervical back: Normal range of motion.  Feet:     Right foot:     Toenail Condition: Right toenails are ingrown.     Left foot:     Toenail Condition: Left toenails are normal.     Comments: Right great toe ingrown toenail without signs of infection. No erythema, edema, tenderness or discharge. Skin:    General: Skin is warm and dry.  Neurological:     Mental Status: He is alert and oriented to person, place, and time. Mental  status is at baseline.  Psychiatric:        Mood and Affect: Mood normal.        Behavior: Behavior normal.        Thought Content: Thought content normal.        Judgment: Judgment normal.        10/08/2022    9:46 AM 10/01/2022    2:03 PM 08/20/2022    9:15 AM 02/21/2022   10:40 AM 09/24/2021    2:12 PM  Depression screen PHQ 2/9  Decreased Interest 0 0 0 0 0  Down, Depressed, Hopeless 0 0 0 0 0  PHQ - 2 Score 0 0 0 0 0  Altered sleeping 1 3 3     Tired, decreased energy 0 0 0    Change in appetite 1 3 0    Feeling bad or failure about yourself  0 0 0    Trouble concentrating 0 0 0    Moving slowly or fidgety/restless 0 0 0    Suicidal thoughts 0 0 0    PHQ-9 Score 2 6 3     Difficult doing work/chores Somewhat  difficult Not difficult at all Somewhat difficult        10/08/2022    9:46 AM 08/20/2022    9:16 AM 07/22/2019    3:26 PM  GAD 7 : Generalized Anxiety Score  Nervous, Anxious, on Edge 2 3 0  Control/stop worrying 2 1 0  Worry too much - different things 0 0 0  Trouble relaxing 2 1 0  Restless 1 2 0  Easily annoyed or irritable 1 1 0  Afraid - awful might happen 1 0 0  Total GAD 7 Score 9 8 0  Anxiety Difficulty Somewhat difficult Not difficult at all Not difficult at all    ASSESSMENT/PLAN:  Cyst of buttocks Assessment & Plan: No current pain or tenderness during office visit.  Unable to appreciate cyst on left gluteal area.   Recommend if recurs to follow up in clinic while having symptoms so can evaluate.  Patient agreeable to plan Continue to monitor    Ingrown toenail of right foot Assessment & Plan: Resolved No signs of infection.  Pain resolved Educated on how to trim nails Warm water soaks as needed No indication to continue Mupirocin ointment  Can refer to Podiatry if recurs for removal  Follow up as needed    PDMP reviewed  Return if symptoms worsen or fail to improve.  Dana Allan, MD

## 2022-10-20 LAB — COMPREHENSIVE METABOLIC PANEL: eGFR: 87

## 2022-10-22 DIAGNOSIS — H5010 Unspecified exotropia: Secondary | ICD-10-CM | POA: Diagnosis not present

## 2022-10-22 DIAGNOSIS — H40003 Preglaucoma, unspecified, bilateral: Secondary | ICD-10-CM | POA: Diagnosis not present

## 2022-10-23 DIAGNOSIS — Z79899 Other long term (current) drug therapy: Secondary | ICD-10-CM | POA: Diagnosis not present

## 2022-10-23 DIAGNOSIS — Z87891 Personal history of nicotine dependence: Secondary | ICD-10-CM | POA: Diagnosis not present

## 2022-10-23 DIAGNOSIS — Z23 Encounter for immunization: Secondary | ICD-10-CM | POA: Diagnosis not present

## 2022-10-23 DIAGNOSIS — D708 Other neutropenia: Secondary | ICD-10-CM | POA: Diagnosis not present

## 2022-10-23 DIAGNOSIS — Z720 Tobacco use: Secondary | ICD-10-CM | POA: Diagnosis not present

## 2022-10-24 ENCOUNTER — Encounter: Payer: Self-pay | Admitting: Family Medicine

## 2022-10-24 DIAGNOSIS — L729 Follicular cyst of the skin and subcutaneous tissue, unspecified: Secondary | ICD-10-CM | POA: Insufficient documentation

## 2022-10-24 DIAGNOSIS — L600 Ingrowing nail: Secondary | ICD-10-CM | POA: Insufficient documentation

## 2022-10-24 NOTE — Assessment & Plan Note (Addendum)
Resolved No signs of infection.  Pain resolved Educated on how to trim nails Warm water soaks as needed No indication to continue Mupirocin ointment  Can refer to Podiatry if recurs for removal  Follow up as needed

## 2022-10-24 NOTE — Assessment & Plan Note (Addendum)
No current pain or tenderness during office visit.  Unable to appreciate cyst on left gluteal area.   Recommend if recurs to follow up in clinic while having symptoms so can evaluate.  Patient agreeable to plan Continue to monitor

## 2022-11-07 DIAGNOSIS — S7002XD Contusion of left hip, subsequent encounter: Secondary | ICD-10-CM | POA: Diagnosis not present

## 2022-11-07 DIAGNOSIS — S7001XD Contusion of right hip, subsequent encounter: Secondary | ICD-10-CM | POA: Diagnosis not present

## 2022-11-07 DIAGNOSIS — S20229D Contusion of unspecified back wall of thorax, subsequent encounter: Secondary | ICD-10-CM | POA: Diagnosis not present

## 2022-11-11 ENCOUNTER — Encounter: Payer: Self-pay | Admitting: Acute Care

## 2022-11-11 ENCOUNTER — Ambulatory Visit (INDEPENDENT_AMBULATORY_CARE_PROVIDER_SITE_OTHER): Payer: 59 | Admitting: Acute Care

## 2022-11-11 DIAGNOSIS — F17210 Nicotine dependence, cigarettes, uncomplicated: Secondary | ICD-10-CM | POA: Diagnosis not present

## 2022-11-11 NOTE — Patient Instructions (Signed)

## 2022-11-11 NOTE — Progress Notes (Signed)
Virtual Visit via Telephone Note  I connected with Samuel Burnett on 11/11/22 at  2:30 PM EDT by telephone and verified that I am speaking with the correct person using two identifiers.  Location: Patient:  At home Provider: [address] [place], [place], [address]    I discussed the limitations, risks, security and privacy concerns of performing an evaluation and management service by telephone and the availability of in person appointments. I also discussed with the patient that there may be a patient responsible charge related to this service. The patient expressed understanding and agreed to proceed.    Shared Decision Making Visit Lung Cancer Screening Program [phone])   Eligibility: Age 62 y.o. Pack Years Smoking History Calculation 49 pack year smoking history (# packs/per year x # years smoked) Recent History of coughing up blood  no Unexplained weight loss? no ( >Than 15 pounds within the last 6 months ) Prior History Lung / other cancer no (Diagnosis within the last 5 years already requiring surveillance chest CT Scans). Smoking Status Current Smoker Former Smokers: Years since quit:  NA  Quit Date:  NA  Visit Components: Discussion included one or more decision making aids. yes Discussion included risk/benefits of screening. yes Discussion included potential follow up diagnostic testing for abnormal scans. yes Discussion included meaning and risk of over diagnosis. yes Discussion included meaning and risk of False Positives. yes Discussion included meaning of total radiation exposure. yes  Counseling Included: Importance of adherence to annual lung cancer LDCT screening. yes Impact of comorbidities on ability to participate in the program. yes Ability and willingness to under diagnostic treatment. yes  Smoking Cessation Counseling: Current Smokers:  Discussed importance of smoking cessation. yes Information about tobacco cessation classes and  interventions provided to patient. yes Patient provided with "ticket" for LDCT Scan. yes Symptomatic Patient. no  Counseling NA Diagnosis Code: Tobacco Use Z72.0 Asymptomatic Patient yes  Counseling (Intermediate counseling: > three minutes counseling) U0454 Former Smokers:  Discussed the importance of maintaining cigarette abstinence. yes Diagnosis Code: Personal History of Nicotine Dependence. U98.119 Information about tobacco cessation classes and interventions provided to patient. Yes Patient provided with "ticket" for LDCT Scan. yes Written Order for Lung Cancer Screening with LDCT placed in Epic. Yes (CT Chest Lung Cancer Screening Low Dose W/O CM) [id number] Z12.2-Screening of respiratory organs Z87.891-Personal history of nicotine dependence  I have spent 25 minutes of face to face/ virtual visit   time with  Mr. Ferrelli discussing the risks and benefits of lung cancer screening. We viewed / discussed a power point together that explained in detail the above noted topics. We paused at intervals to allow for questions to be asked and answered to ensure understanding.We discussed that the single most powerful action that he can take to decrease his risk of developing lung cancer is to quit smoking. We discussed whether or not he is ready to commit to setting a quit date. We discussed options for tools to aid in quitting smoking including nicotine replacement therapy, non-nicotine medications, support groups, Quit Smart classes, and behavior modification. We discussed that often times setting smaller, more achievable goals, such as eliminating 1 cigarette a day for a week and then 2 cigarettes a day for a week can be helpful in slowly decreasing the number of cigarettes smoked. This allows for a sense of accomplishment as well as providing a clinical benefit. I provided  him  with smoking cessation  information  with contact information for community resources,  classes, free nicotine replacement  therapy, and access to mobile apps, text messaging, and on-line smoking cessation help. I have also provided  him  the office contact information in the event he needs to contact me, or the screening staff. We discussed the time and location of the scan, and that either Abigail Miyamoto RN, Karlton Lemon, RN  or I will call / send a letter with the results within 24-72 hours of receiving them. The patient verbalized understanding of all of  the above and had no further questions upon leaving the office. They have my contact information in the event they have any further questions.  I spent 3-4 minutes counseling on smoking cessation and the health risks of continued tobacco abuse.  I explained to the patient that there has been a high incidence of coronary artery disease noted on these exams. I explained that this is a non-gated exam therefore degree or severity cannot be determined. This patient is on statin therapy. I have asked the patient to follow-up with their PCP regarding any incidental finding of coronary artery disease and management with diet or medication as their PCP  feels is clinically indicated. The patient verbalized understanding of the above and had no further questions upon completion of the visit.  Pt. Was inattentive during tele visit, but verbalized understanding of the above at the end of the call. Bevelyn Ngo, NP 11/11/2022

## 2022-11-12 ENCOUNTER — Ambulatory Visit
Admission: RE | Admit: 2022-11-12 | Discharge: 2022-11-12 | Disposition: A | Discharge status: Home / Self Care | Payer: 59 | Source: Ambulatory Visit | Attending: Acute Care | Admitting: Acute Care

## 2022-11-12 DIAGNOSIS — F17210 Nicotine dependence, cigarettes, uncomplicated: Secondary | ICD-10-CM

## 2022-11-12 DIAGNOSIS — Z122 Encounter for screening for malignant neoplasm of respiratory organs: Secondary | ICD-10-CM

## 2022-11-12 DIAGNOSIS — Z87891 Personal history of nicotine dependence: Secondary | ICD-10-CM | POA: Diagnosis not present

## 2022-11-17 DIAGNOSIS — H40003 Preglaucoma, unspecified, bilateral: Secondary | ICD-10-CM | POA: Diagnosis not present

## 2022-11-19 ENCOUNTER — Telehealth: Payer: Self-pay | Admitting: Family Medicine

## 2022-11-19 NOTE — Telephone Encounter (Signed)
Pt states he would like a podiatrist referral for the in grown toe nails. Please advise and Thank you!

## 2022-11-20 DIAGNOSIS — Z880 Allergy status to penicillin: Secondary | ICD-10-CM | POA: Diagnosis not present

## 2022-11-20 DIAGNOSIS — E785 Hyperlipidemia, unspecified: Secondary | ICD-10-CM | POA: Diagnosis not present

## 2022-11-20 DIAGNOSIS — E119 Type 2 diabetes mellitus without complications: Secondary | ICD-10-CM | POA: Diagnosis not present

## 2022-11-20 DIAGNOSIS — M5030 Other cervical disc degeneration, unspecified cervical region: Secondary | ICD-10-CM | POA: Diagnosis not present

## 2022-11-20 DIAGNOSIS — R300 Dysuria: Secondary | ICD-10-CM | POA: Diagnosis not present

## 2022-11-20 DIAGNOSIS — I10 Essential (primary) hypertension: Secondary | ICD-10-CM | POA: Diagnosis not present

## 2022-11-20 DIAGNOSIS — Z79899 Other long term (current) drug therapy: Secondary | ICD-10-CM | POA: Diagnosis not present

## 2022-11-20 DIAGNOSIS — Z87891 Personal history of nicotine dependence: Secondary | ICD-10-CM | POA: Diagnosis not present

## 2022-11-24 ENCOUNTER — Telehealth: Payer: Self-pay | Admitting: Acute Care

## 2022-11-24 DIAGNOSIS — R911 Solitary pulmonary nodule: Secondary | ICD-10-CM

## 2022-11-24 NOTE — Telephone Encounter (Signed)
Called and spoke to patient regarding recent results for LDCT. Spoke with patient regarding his new 4mm nodule and the recommendations to have a repeat scan in 6 months for re-evaluation. Also advised pt about the aortic dilation and yearly monitoring is warranted per the radiologist. Patient states he does not have a cardiologist. Advised pt we will send results to PCP and he needs to follow up with them regarding aortic dilation. He verbalized understanding. Order placed for 6 month follow up scan. Results sent to PCP.

## 2022-11-25 ENCOUNTER — Encounter: Payer: Self-pay | Admitting: Family Medicine

## 2022-11-28 ENCOUNTER — Other Ambulatory Visit: Payer: Self-pay

## 2022-11-28 MED ORDER — MONTELUKAST SODIUM 10 MG PO TABS: ORAL_TABLET | ORAL | 3 refills | Status: DC

## 2022-11-28 NOTE — Telephone Encounter (Signed)
LOV 10/08/2022 NOV 08/20/2023

## 2022-12-30 ENCOUNTER — Other Ambulatory Visit: Payer: Self-pay | Admitting: *Deleted

## 2022-12-30 DIAGNOSIS — D708 Other neutropenia: Secondary | ICD-10-CM

## 2022-12-31 ENCOUNTER — Inpatient Hospital Stay: Payer: 59 | Attending: Oncology

## 2023-01-15 DIAGNOSIS — R300 Dysuria: Secondary | ICD-10-CM | POA: Diagnosis not present

## 2023-02-17 ENCOUNTER — Telehealth: Payer: Self-pay | Admitting: Family Medicine

## 2023-02-17 NOTE — Telephone Encounter (Signed)
Pt called wanting to check on a referral for his feet

## 2023-02-18 NOTE — Telephone Encounter (Signed)
Called pt and gave him the number to call and make an appointment for [facility]

## 2023-04-06 ENCOUNTER — Other Ambulatory Visit: Payer: Self-pay | Admitting: Family Medicine

## 2023-04-06 DIAGNOSIS — E782 Mixed hyperlipidemia: Secondary | ICD-10-CM

## 2023-04-06 DIAGNOSIS — I252 Old myocardial infarction: Secondary | ICD-10-CM

## 2023-04-22 ENCOUNTER — Telehealth: Payer: Self-pay | Admitting: Family Medicine

## 2023-04-22 NOTE — Telephone Encounter (Signed)
Copied from CRM [id number]. Topic: Clinical - Prescription Issue >> Apr 22, 2023 11:15 AM Florestine Avers wrote: Reason for CRM: Patient called in stating that he wanted to inform Dr. Clent Ridges that she increased his amLODipine (NORVASC) to 5 MG tablet and he can not take it. Patient is having symptoms including feeling weak and dizzy, he thinks the medication is causing his blood pressure to become to low. He is requesting his blood pressure medication is decreased to his previous dosage of 2.1 amLODipine (NORVASC). Patient is requesting a call back to touch base with the provider.

## 2023-04-23 DIAGNOSIS — Z79899 Other long term (current) drug therapy: Secondary | ICD-10-CM | POA: Diagnosis not present

## 2023-04-23 DIAGNOSIS — Z720 Tobacco use: Secondary | ICD-10-CM | POA: Diagnosis not present

## 2023-04-23 DIAGNOSIS — Z23 Encounter for immunization: Secondary | ICD-10-CM | POA: Diagnosis not present

## 2023-04-23 DIAGNOSIS — F17210 Nicotine dependence, cigarettes, uncomplicated: Secondary | ICD-10-CM | POA: Diagnosis not present

## 2023-04-23 DIAGNOSIS — I152 Hypertension secondary to endocrine disorders: Secondary | ICD-10-CM | POA: Diagnosis not present

## 2023-04-23 DIAGNOSIS — I10 Essential (primary) hypertension: Secondary | ICD-10-CM | POA: Diagnosis not present

## 2023-04-23 DIAGNOSIS — E1159 Type 2 diabetes mellitus with other circulatory complications: Secondary | ICD-10-CM | POA: Diagnosis not present

## 2023-04-28 ENCOUNTER — Ambulatory Visit: Payer: 59 | Admitting: Family Medicine

## 2023-05-25 ENCOUNTER — Ambulatory Visit

## 2023-05-28 ENCOUNTER — Ambulatory Visit (INDEPENDENT_AMBULATORY_CARE_PROVIDER_SITE_OTHER): Admitting: Family Medicine

## 2023-05-28 ENCOUNTER — Encounter: Payer: Self-pay | Admitting: Family Medicine

## 2023-05-28 VITALS — BP 128/70 | HR 76 | Temp 98.2°F | Resp 20 | Ht 71.0 in | Wt 140.0 lb

## 2023-05-28 DIAGNOSIS — E785 Hyperlipidemia, unspecified: Secondary | ICD-10-CM | POA: Diagnosis not present

## 2023-05-28 DIAGNOSIS — I152 Hypertension secondary to endocrine disorders: Secondary | ICD-10-CM

## 2023-05-28 DIAGNOSIS — E118 Type 2 diabetes mellitus with unspecified complications: Secondary | ICD-10-CM

## 2023-05-28 DIAGNOSIS — E1159 Type 2 diabetes mellitus with other circulatory complications: Secondary | ICD-10-CM

## 2023-05-28 DIAGNOSIS — J431 Panlobular emphysema: Secondary | ICD-10-CM | POA: Diagnosis not present

## 2023-05-28 DIAGNOSIS — G8929 Other chronic pain: Secondary | ICD-10-CM

## 2023-05-28 DIAGNOSIS — G2581 Restless legs syndrome: Secondary | ICD-10-CM | POA: Diagnosis not present

## 2023-05-28 DIAGNOSIS — E1169 Type 2 diabetes mellitus with other specified complication: Secondary | ICD-10-CM

## 2023-05-28 LAB — COMPREHENSIVE METABOLIC PANEL
ALT: 20 U/L (ref 0–53)
AST: 31 U/L (ref 0–37)
Albumin: 4.4 g/dL (ref 3.5–5.2)
Alkaline Phosphatase: 86 U/L (ref 39–117)
BUN: 15 mg/dL (ref 6–23)
CO2: 29 meq/L (ref 19–32)
Calcium: 9.1 mg/dL (ref 8.4–10.5)
Chloride: 105 meq/L (ref 96–112)
Creatinine, Ser: 0.94 mg/dL (ref 0.40–1.50)
GFR: 85.51 mL/min (ref >60.00)
Glucose, Bld: 95 mg/dL (ref 70–99)
Potassium: 4.2 meq/L (ref 3.5–5.1)
Sodium: 140 meq/L (ref 135–145)
Total Bilirubin: 0.6 mg/dL (ref 0.2–1.2)
Total Protein: 7.1 g/dL (ref 6.0–8.3)

## 2023-05-28 LAB — LIPID PANEL
Cholesterol: 168 mg/dL (ref 0–200)
HDL: 68.7 mg/dL (ref >39.00)
LDL Cholesterol: 88 mg/dL (ref 0–99)
NonHDL: 99.67
Total CHOL/HDL Ratio: 2
Triglycerides: 58 mg/dL (ref 0.0–149.0)
VLDL: 11.6 mg/dL (ref 0.0–40.0)

## 2023-05-28 LAB — HEMOGLOBIN A1C: Hgb A1c MFr Bld: 6.3 % (ref 4.6–6.5)

## 2023-05-28 MED ORDER — ROPINIROLE HCL 1 MG PO TABS
1.0000 mg | ORAL_TABLET | Freq: Every day | ORAL | 3 refills | Status: DC
Start: 2023-05-28 — End: 2023-11-24

## 2023-05-28 MED ORDER — ALBUTEROL SULFATE HFA 108 (90 BASE) MCG/ACT IN AERS
1.0000 | INHALATION_SPRAY | Freq: Four times a day (QID) | RESPIRATORY_TRACT | 3 refills | Status: AC | PRN
Start: 2023-05-28 — End: ?

## 2023-05-28 MED ORDER — DULOXETINE HCL 60 MG PO CPEP
60.0000 mg | ORAL_CAPSULE | Freq: Every day | ORAL | 3 refills | Status: DC
Start: 2023-05-28 — End: 2023-07-10

## 2023-05-28 MED ORDER — AMLODIPINE BESYLATE 2.5 MG PO TABS: 1.2500 mg | ORAL_TABLET | Freq: Every day | ORAL | Status: AC

## 2023-05-28 MED ORDER — EZETIMIBE 10 MG PO TABS
10.0000 mg | ORAL_TABLET | Freq: Every day | ORAL | 3 refills | Status: DC
Start: 2023-05-28 — End: 2023-11-24

## 2023-05-28 NOTE — Patient Instructions (Signed)
 It was a pleasure meeting you today. Thank you for allowing me to take part in your health care.  Our goals for today as we discussed include:  We will get some labs today.  If they are abnormal or we need to do something about them, I will call you.  If they are normal, I will send you a message on MyChart (if it is active) or a letter in the mail.  If you don't hear from [place] in 2 weeks, please call the office at the number below.   Refills sent for requested medication  This is a list of the screening recommended for you and due dates:  Health Maintenance  Topic Date Due   COVID-19 Vaccine (1) Never done   Eye exam for diabetics  07/30/2022   Hemoglobin A1C  01/29/2023   Flu Shot  06/08/2023*   Yearly kidney health urinalysis for diabetes  07/29/2023   Complete foot exam   08/20/2023   Medicare Annual Wellness Visit  10/01/2023   Yearly kidney function blood test for diabetes  10/20/2023   Screening for Lung Cancer  11/12/2023   Pneumococcal Vaccination (4 of 4 - PPSV23 or PCV20) 01/23/2025   DTaP/Tdap/Td vaccine (3 - Td or Tdap) 12/19/2025   Colon Cancer Screening  06/20/2028   Hepatitis C Screening  Completed   HIV Screening  Completed   Zoster (Shingles) Vaccine  Completed   HPV Vaccine  Aged Out  *Topic was postponed. The date shown is not the original due date.      If you have any questions or concerns, please do not hesitate to call the office at [phone].  I look forward to our next visit and until then take care and stay safe.  Regards,   Dana Allan, MD   [facility]

## 2023-05-28 NOTE — Progress Notes (Signed)
 SUBJECTIVE:   Chief Complaint  Patient presents with   Hypertension   HPI Presents for follow up chronic disease management  Discussed the use of AI scribe software for clinical note transcription with the patient, who gave verbal consent to proceed.  History of Present Illness Samuel Burnett is a 63 year old male with hypertension who presents for follow-up of blood pressure management.  He monitors his blood pressure at home, noting it remains stable and often lower than the clinic reading of 128/70 mmHg, particularly when not stressed. He takes his blood pressure medication at night.  He experienced dizziness, which he attributes to an increase in his amlodipine dosage. Initially prescribed 2.5 mg of amlodipine. Dose was changes at [facility] and increased to 5 mg. He noticed dizziness with the higher dose and reduced his intake to 1.25 mg by breaking the 2.5 mg tablet in half. Since adjusting the dose, his dizziness has resolved.  He reports that he was previously prescribed 2.5 mg by former PCP and was advised to take 1.25 mg so currently at this dose.    His current medication regimen includes taking amlodipine, pravastatin, Requip, and another unspecified medication at night. During the day, he takes Diktarvy and Cymbalta.  He has no history of heart attack or stroke and has been commended for having no plaque buildup at his age. He continues to take pravastatin for cholesterol management.      PERTINENT PMH / PSH: As above  OBJECTIVE:  BP 128/70   Pulse 76   Temp 98.2 F (36.8 C)   Resp 20   Ht 5\' 11"  (1.803 m)   Wt 140 lb (63.5 kg)   SpO2 96%   BMI 19.53 kg/m    Physical Exam Vitals reviewed.  Constitutional:      General: He is not in acute distress.    Appearance: Normal appearance. He is normal weight. He is not ill-appearing, toxic-appearing or diaphoretic.  Eyes:     General:        Right eye: No discharge.        Left eye: No discharge.  Cardiovascular:      Rate and Rhythm: Normal rate and regular rhythm.     Heart sounds: Normal heart sounds.  Pulmonary:     Effort: Pulmonary effort is normal.     Breath sounds: Normal breath sounds.  Abdominal:     General: Bowel sounds are normal.  Musculoskeletal:        General: Normal range of motion.     Cervical back: Normal range of motion.  Skin:    General: Skin is warm and dry.  Neurological:     Mental Status: He is alert and oriented to person, place, and time. Mental status is at baseline.  Psychiatric:        Mood and Affect: Mood normal.        Behavior: Behavior normal.        Thought Content: Thought content normal.        Judgment: Judgment normal.           05/28/2023   10:26 AM 10/08/2022    9:46 AM 10/01/2022    2:03 PM 08/20/2022    9:15 AM 02/21/2022   10:40 AM  Depression screen PHQ 2/9  Decreased Interest 0 0 0 0 0  Down, Depressed, Hopeless 0 0 0 0 0  PHQ - 2 Score 0 0 0 0 0  Altered sleeping 1 1 3  3  Tired, decreased energy 0 0 0 0   Change in appetite 2 1 3  0   Feeling bad or failure about yourself  0 0 0 0   Trouble concentrating 0 0 0 0   Moving slowly or fidgety/restless 0 0 0 0   Suicidal thoughts 0 0 0 0   PHQ-9 Score 3 2 6 3    Difficult doing work/chores Not difficult at all Somewhat difficult Not difficult at all Somewhat difficult       05/28/2023   10:26 AM 10/08/2022    9:46 AM 08/20/2022    9:16 AM 07/22/2019    3:26 PM  GAD 7 : Generalized Anxiety Score  Nervous, Anxious, on Edge 0 2 3 0  Control/stop worrying 0 2 1 0  Worry too much - different things 0 0 0 0  Trouble relaxing 2 2 1  0  Restless 0 1 2 0  Easily annoyed or irritable 1 1 1  0  Afraid - awful might happen 0 1 0 0  Total GAD 7 Score 3 9 8  0  Anxiety Difficulty  Somewhat difficult Not difficult at all Not difficult at all    ASSESSMENT/PLAN:  Hypertension associated with diabetes [facility]) Assessment & Plan: Blood pressure controlled at 128/70 mmHg on 1.25 mg amlodipine. No  cardiovascular events reported. Dizziness resolved on lower dose - Continue 1.25 mg amlodipine if blood pressure remains controlled. - Request that patient send medication label and pill picture via MyChart for verification. - Check labs  Orders: -     amLODIPine Besylate; Take 0.5 tablets (1.25 mg total) by mouth daily. -     Comprehensive metabolic panel with GFR  Type 2 diabetes with complication [facility]) Assessment & Plan: Chronic.  Stable.  Diet controlled Microalbuminuria noted.  Does not want to start ARB BP well controlled on current CCB Continue statin Foot exam completed today Recommend diabetic eye exam    Orders: -     Hemoglobin A1c  Hyperlipidemia associated with type 2 diabetes mellitus (HCC) Assessment & Plan: On statin and tolerating well.  No myalgias.  Refill Zetia 10 mg daily Continue Pravachol 40 mg daily Check fasting lipids   Orders: -     Ezetimibe; Take 1 tablet (10 mg total) by mouth daily.  Dispense: 90 tablet; Refill: 3 -     Lipid panel  Panlobular emphysema (HCC) Assessment & Plan: Chronic.  Stable.  Currently on Breo Ellipta daily and albuterol inhaler as needed.  Refill Albuterol  Orders: -     Albuterol Sulfate HFA; Inhale 1-2 puffs into the lungs every 6 (six) hours as needed for wheezing or shortness of breath.  Dispense: 54 each; Refill: 3  RLS (restless legs syndrome) Assessment & Plan: Refill Ropinirole  Orders: -     rOPINIRole HCl; Take 1 tablet (1 mg total) by mouth at bedtime.  Dispense: 100 tablet; Refill: 3  Other chronic pain Assessment & Plan: Refill Duloxetine  Orders: -     DULoxetine HCl; Take 1 capsule (60 mg total) by mouth daily.  Dispense: 90 capsule; Refill: 3   PDMP reviewed  Return if symptoms worsen or fail to improve, for PCP.  Dana Allan, MD

## 2023-06-02 ENCOUNTER — Ambulatory Visit

## 2023-06-03 ENCOUNTER — Ambulatory Visit
Admission: RE | Admit: 2023-06-03 | Discharge: 2023-06-03 | Disposition: A | Discharge status: Home / Self Care | Source: Ambulatory Visit | Attending: Acute Care | Admitting: Acute Care

## 2023-06-03 DIAGNOSIS — R911 Solitary pulmonary nodule: Secondary | ICD-10-CM | POA: Insufficient documentation

## 2023-06-03 DIAGNOSIS — I700 Atherosclerosis of aorta: Secondary | ICD-10-CM | POA: Diagnosis not present

## 2023-06-03 DIAGNOSIS — J432 Centrilobular emphysema: Secondary | ICD-10-CM | POA: Diagnosis not present

## 2023-06-06 ENCOUNTER — Encounter: Payer: Self-pay | Admitting: Family Medicine

## 2023-06-06 NOTE — Assessment & Plan Note (Signed)
Refill    Ropinirole

## 2023-06-06 NOTE — Assessment & Plan Note (Signed)
Refill Duloxetine

## 2023-06-06 NOTE — Assessment & Plan Note (Signed)
 On statin and tolerating well.  No myalgias.  Refill Zetia 10 mg daily Continue Pravachol 40 mg daily Check fasting lipids

## 2023-06-06 NOTE — Assessment & Plan Note (Signed)
 Chronic.  Stable.  Currently on Breo Ellipta daily and albuterol inhaler as needed.  Refill Albuterol

## 2023-06-06 NOTE — Assessment & Plan Note (Signed)
 Chronic.  Stable.  Diet controlled Microalbuminuria noted.  Does not want to start ARB BP well controlled on current CCB Continue statin Foot exam completed today Recommend diabetic eye exam

## 2023-06-06 NOTE — Assessment & Plan Note (Signed)
 Blood pressure controlled at 128/70 mmHg on 1.25 mg amlodipine. No cardiovascular events reported. Dizziness resolved on lower dose - Continue 1.25 mg amlodipine if blood pressure remains controlled. - Request that patient send medication label and pill picture via MyChart for verification. - Check labs

## 2023-06-15 ENCOUNTER — Other Ambulatory Visit: Payer: Self-pay | Admitting: Family Medicine

## 2023-06-15 DIAGNOSIS — G2581 Restless legs syndrome: Secondary | ICD-10-CM

## 2023-07-03 ENCOUNTER — Inpatient Hospital Stay: Payer: 59 | Attending: Oncology

## 2023-07-03 ENCOUNTER — Encounter: Payer: Self-pay | Admitting: Oncology

## 2023-07-03 ENCOUNTER — Inpatient Hospital Stay (HOSPITAL_BASED_OUTPATIENT_CLINIC_OR_DEPARTMENT_OTHER): Payer: 59 | Admitting: Oncology

## 2023-07-03 VITALS — BP 150/90 | HR 80 | Temp 97.0°F | Resp 18 | Ht 71.0 in | Wt 138.1 lb

## 2023-07-03 DIAGNOSIS — D709 Neutropenia, unspecified: Secondary | ICD-10-CM | POA: Insufficient documentation

## 2023-07-03 DIAGNOSIS — F1290 Cannabis use, unspecified, uncomplicated: Secondary | ICD-10-CM | POA: Diagnosis not present

## 2023-07-03 DIAGNOSIS — Z87891 Personal history of nicotine dependence: Secondary | ICD-10-CM | POA: Diagnosis not present

## 2023-07-03 DIAGNOSIS — D708 Other neutropenia: Secondary | ICD-10-CM

## 2023-07-03 LAB — CBC WITH DIFFERENTIAL/PLATELET
Abs Immature Granulocytes: 0 10*3/uL (ref 0.00–0.07)
Basophils Absolute: 0.1 10*3/uL (ref 0.0–0.1)
Basophils Relative: 2 %
Eosinophils Absolute: 0.1 10*3/uL (ref 0.0–0.5)
Eosinophils Relative: 3 %
HCT: 43.3 % (ref 39.0–52.0)
Hemoglobin: 14.9 g/dL (ref 13.0–17.0)
Immature Granulocytes: 0 %
Lymphocytes Relative: 51 %
Lymphs Abs: 2 10*3/uL (ref 0.7–4.0)
MCH: 33.2 pg (ref 26.0–34.0)
MCHC: 34.4 g/dL (ref 30.0–36.0)
MCV: 96.4 fL (ref 80.0–100.0)
Monocytes Absolute: 0.6 10*3/uL (ref 0.1–1.0)
Monocytes Relative: 15 %
Neutro Abs: 1.1 10*3/uL — ABNORMAL LOW (ref 1.7–7.7)
Neutrophils Relative %: 29 %
Platelets: 192 10*3/uL (ref 150–400)
RBC: 4.49 MIL/uL (ref 4.22–5.81)
RDW: 13.3 % (ref 11.5–15.5)
WBC: 3.8 10*3/uL — ABNORMAL LOW (ref 4.0–10.5)
nRBC: 0 % (ref 0.0–0.2)
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
## 2023-07-06 ENCOUNTER — Other Ambulatory Visit: Payer: Self-pay | Admitting: Acute Care

## 2023-07-06 DIAGNOSIS — Z122 Encounter for screening for malignant neoplasm of respiratory organs: Secondary | ICD-10-CM

## 2023-07-06 DIAGNOSIS — Z87891 Personal history of nicotine dependence: Secondary | ICD-10-CM

## 2023-07-06 DIAGNOSIS — F17210 Nicotine dependence, cigarettes, uncomplicated: Secondary | ICD-10-CM

## 2023-07-08 ENCOUNTER — Other Ambulatory Visit: Payer: Self-pay | Admitting: Family Medicine

## 2023-07-08 DIAGNOSIS — G8929 Other chronic pain: Secondary | ICD-10-CM

## 2023-07-08 DIAGNOSIS — G4700 Insomnia, unspecified: Secondary | ICD-10-CM

## 2023-08-01 ENCOUNTER — Other Ambulatory Visit: Payer: Self-pay | Admitting: Family Medicine

## 2023-08-05 ENCOUNTER — Telehealth: Payer: Self-pay | Admitting: Family Medicine

## 2023-08-05 NOTE — Telephone Encounter (Signed)
 Patient need lab orders.

## 2023-08-07 DIAGNOSIS — M79675 Pain in left toe(s): Secondary | ICD-10-CM | POA: Diagnosis not present

## 2023-08-07 DIAGNOSIS — L600 Ingrowing nail: Secondary | ICD-10-CM | POA: Diagnosis not present

## 2023-08-07 DIAGNOSIS — M79674 Pain in right toe(s): Secondary | ICD-10-CM | POA: Diagnosis not present

## 2023-08-07 NOTE — Telephone Encounter (Signed)
Lab appointment canceled.  

## 2023-08-13 ENCOUNTER — Other Ambulatory Visit: Payer: 59

## 2023-08-20 ENCOUNTER — Encounter: Payer: 59 | Admitting: Family Medicine

## 2023-08-24 DIAGNOSIS — L600 Ingrowing nail: Secondary | ICD-10-CM | POA: Diagnosis not present

## 2023-08-24 DIAGNOSIS — M79674 Pain in right toe(s): Secondary | ICD-10-CM | POA: Diagnosis not present

## 2023-08-24 DIAGNOSIS — W540XXA Bitten by dog, initial encounter: Secondary | ICD-10-CM | POA: Diagnosis not present

## 2023-08-24 DIAGNOSIS — S81851A Open bite, right lower leg, initial encounter: Secondary | ICD-10-CM | POA: Diagnosis not present

## 2023-08-24 DIAGNOSIS — M79675 Pain in left toe(s): Secondary | ICD-10-CM | POA: Diagnosis not present

## 2023-08-27 ENCOUNTER — Encounter: Payer: Self-pay | Admitting: Family Medicine

## 2023-09-09 ENCOUNTER — Encounter: Payer: Self-pay | Admitting: *Deleted

## 2023-10-02 ENCOUNTER — Telehealth: Payer: Self-pay

## 2023-10-02 NOTE — Telephone Encounter (Signed)
 I called patient to schedule a TOC appointment, but the message on his voicemail said my call cannot be completed at this time.  I will send a message to patient via MyChart.  E2C2 - when patient calls back, please assist him with scheduling a transfer of care appointment.

## 2023-10-22 DIAGNOSIS — Z79624 Long term (current) use of inhibitors of nucleotide synthesis: Secondary | ICD-10-CM | POA: Diagnosis not present

## 2023-10-22 DIAGNOSIS — Z79899 Other long term (current) drug therapy: Secondary | ICD-10-CM | POA: Diagnosis not present

## 2023-10-22 DIAGNOSIS — D709 Neutropenia, unspecified: Secondary | ICD-10-CM | POA: Diagnosis not present

## 2023-10-22 DIAGNOSIS — Z23 Encounter for immunization: Secondary | ICD-10-CM | POA: Diagnosis not present

## 2023-10-22 DIAGNOSIS — F17210 Nicotine dependence, cigarettes, uncomplicated: Secondary | ICD-10-CM | POA: Diagnosis not present

## 2023-11-13 ENCOUNTER — Ambulatory Visit: Payer: Self-pay

## 2023-11-13 NOTE — Telephone Encounter (Signed)
 FYI Only or Action Required?: FYI only for provider.  Patient was last seen in primary care on 05/28/2023 by Samuel Merle, MD.  Called Nurse Triage reporting Hip Pain.  Symptoms began several months ago.  Interventions attempted: Rest, hydration, or home remedies.  Symptoms are: unchanged.  Triage Disposition: See PCP Within 2 Weeks  Patient/caregiver understands and will follow disposition?: Yes Reason for Disposition  Hip pain is a chronic symptom (recurrent or ongoing AND present > 4 weeks)  Answer Assessment - Initial Assessment Questions Had intestinal dryness in his 30's and took slippery elm to help. Hx of kidney stones. Urine comes out slow, slight burning with urination, been happening 3 or 4 months ago. Not established with PCP, scheduled new visit 9/16. Advised patient to ED for worsening symptoms.  1. LOCATION and RADIATION: Where is the pain located? Does the pain spread (shoot) anywhere else?     L side above hip, feels like intestines  2. QUALITY: What does the pain feel like?  (e.g., sharp, dull, aching, burning)     Constant throbbing  3. SEVERITY: How bad is the pain? What does it keep you from doing?   (Scale 1-10; or mild, moderate, severe)     Severe  4. ONSET: When did the pain start? Does it come and go, or is it there all the time?     3 months   6. CAUSE: What do you think is causing the hip pain?      Unsure  7. AGGRAVATING FACTORS: What makes the hip pain worse? (e.g., walking, climbing stairs, running)     No  8. OTHER SYMPTOMS: Do you have any other symptoms? (e.g., back pain, pain shooting down leg,  fever, rash)     Waking up drenched in sweat  Protocols used: Hip Pain-A-AH  Copied from CRM #[id number]. Topic: Clinical - Red Word Triage >> Nov 13, 2023  4:16 PM Dedra B wrote: [facility] that prompted transfer to Nurse Triage: Pt said he is having a griping pain on L side like somebody pulling his intestines. Pt is constant and  throbbing. Pt also having bad night sweats. Warm transfer to nurse triage.

## 2023-11-16 DIAGNOSIS — N390 Urinary tract infection, site not specified: Secondary | ICD-10-CM | POA: Diagnosis not present

## 2023-11-16 DIAGNOSIS — J449 Chronic obstructive pulmonary disease, unspecified: Secondary | ICD-10-CM | POA: Diagnosis not present

## 2023-11-16 DIAGNOSIS — Z5329 Procedure and treatment not carried out because of patient's decision for other reasons: Secondary | ICD-10-CM | POA: Diagnosis not present

## 2023-11-16 DIAGNOSIS — F17210 Nicotine dependence, cigarettes, uncomplicated: Secondary | ICD-10-CM | POA: Diagnosis not present

## 2023-11-16 DIAGNOSIS — Z87442 Personal history of urinary calculi: Secondary | ICD-10-CM | POA: Diagnosis not present

## 2023-11-16 DIAGNOSIS — N200 Calculus of kidney: Secondary | ICD-10-CM | POA: Diagnosis not present

## 2023-11-17 ENCOUNTER — Telehealth: Admitting: Physician Assistant

## 2023-11-17 ENCOUNTER — Ambulatory Visit
Admission: EM | Admit: 2023-11-17 | Discharge: 2023-11-17 | Disposition: A | Discharge status: Home / Self Care | Attending: Physician Assistant | Admitting: Physician Assistant

## 2023-11-17 DIAGNOSIS — M545 Low back pain, unspecified: Secondary | ICD-10-CM | POA: Insufficient documentation

## 2023-11-17 DIAGNOSIS — G8929 Other chronic pain: Secondary | ICD-10-CM | POA: Diagnosis not present

## 2023-11-17 DIAGNOSIS — Z87442 Personal history of urinary calculi: Secondary | ICD-10-CM | POA: Diagnosis not present

## 2023-11-17 DIAGNOSIS — N390 Urinary tract infection, site not specified: Secondary | ICD-10-CM | POA: Diagnosis not present

## 2023-11-17 DIAGNOSIS — N200 Calculus of kidney: Secondary | ICD-10-CM | POA: Diagnosis not present

## 2023-11-17 DIAGNOSIS — F17210 Nicotine dependence, cigarettes, uncomplicated: Secondary | ICD-10-CM | POA: Diagnosis not present

## 2023-11-17 DIAGNOSIS — R109 Unspecified abdominal pain: Secondary | ICD-10-CM | POA: Insufficient documentation

## 2023-11-17 DIAGNOSIS — R300 Dysuria: Secondary | ICD-10-CM

## 2023-11-17 LAB — URINALYSIS, W/ REFLEX TO CULTURE (INFECTION SUSPECTED)
Glucose, UA: NEGATIVE mg/dL
Nitrite: NEGATIVE
Protein, ur: 100 mg/dL — AB
Specific Gravity, Urine: 1.025 (ref 1.005–1.030)
pH: 5.5 (ref 5.0–8.0)

## 2023-11-17 MED ORDER — PREDNISONE 10 MG PO TABS: ORAL_TABLET | ORAL | 0 refills | Status: DC

## 2023-11-17 MED ORDER — KETOROLAC TROMETHAMINE 60 MG/2ML IM SOLN
30.0000 mg | Freq: Once | INTRAMUSCULAR | Status: AC
Start: 2023-11-17 — End: 2023-11-17
  Administered 2023-11-17: 30 mg via INTRAMUSCULAR

## 2023-11-17 MED ORDER — CIPROFLOXACIN HCL 500 MG PO TABS
500.0000 mg | ORAL_TABLET | Freq: Two times a day (BID) | ORAL | 0 refills | Status: DC
Start: 2023-11-17 — End: 2023-11-24

## 2023-11-17 MED ORDER — CYCLOBENZAPRINE HCL 10 MG PO TABS
10.0000 mg | ORAL_TABLET | Freq: Three times a day (TID) | ORAL | 0 refills | Status: DC | PRN
Start: 2023-11-17 — End: 2023-11-24

## 2023-11-17 NOTE — ED Provider Notes (Signed)
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   11/16/2023 5:45 PM EDT [facility]    Hyaline Casts 0 /lpf   11/16/2023 5:45 PM EDT [facility]    Comment: Occasional hyaline casts may be found in normal urine.   Lab Results - (ABNORMAL) Culture, Urine, Routine with Pyuria Screen (reflexed from Urinalysis) (11/16/2023 4:40 PM EDT) Specimen (Source) Anatomical Location / Laterality Collection Method / Volume Collection Time R    EKG   Radiology No results found. Comparison: CT abdomen pelvis 09/22/2016  Indication: Flank pain, kidney stone suspected.  Technique: CT imaging from the level of the kidneys to the pelvis was performed without intravenous or oral contrast. The patient was scanned in the prone position. Coronal and sagittal reformatted images were generated and reviewed to assist with anatomic localization and lesion detection.  Findings:  - Lower Thorax: Atelectasis or parenchymal scarring within the anterior aspect of the bilateral lung bases. Scattered thin-walled cystic spaces within the bilateral lower lobes. No suspicious pulmonary nodule. Heart is normal in size. Moderate coronary artery atherosclerotic disease. No pericardial or pleural effusion.  Limited assessment given the lack of IV contrast, particularly of the solid organs, bowel, and vasculature.  - Liver: Normal in size with smooth hepatic contour. Hypodense lesion within the right hepatic lobe measuring 0.9 cm is too small to characterize but stable in size compared to prior examination. Unremarkable  noncontrast appearance.  - Biliary and Gallbladder: No intrahepatic or extrahepatic bile duct dilatation. The gallbladder is normal in appearance.  - Spleen: Normal in size.  - Pancreas: Unremarkable noncontrast appearance.  - Adrenal Glands: Normal.  - Kidneys: Kidneys are symmetric in size. Nonobstructing irregular renal calculus within the inferior pole calyces of the right kidney measuring up to 1.0 cm. There is an adjacent stone measuring up to 0.5 cm. No hydronephrosis.  - Abdominal and Pelvic Vasculature: No abdominal aortic aneurysm. Moderate atherosclerotic plaque.  - Gastrointestinal Tract: Stomach and small bowel is normal in caliber. Moderate volume stool burden within the colon. Pancolonic diverticulosis. Normal right lower quadrant appendix.  - Peritoneum/Mesentery/Retroperitoneum: No definite free fluid. No free intraperitoneal air.  - Lymph Nodes: No retroperitoneal, mesenteric, or pelvic lymphadenopathy.  - Bladder: Limited evaluation of the urinary bladder due to streak artifact from bilateral total hip arthroplasties.  - Pelvic Organs: Limited evaluation the prostate gland due to streak artifact from the bilateral total hip arthroplasties.  - Body Wall: Right flank neurostimulator device with leads coursing dorsally along the spine and extending inferiorly off the field-of-view.  - Musculoskeletal: No aggressive appearing osseous lesions. Mild spondylotic changes of the thoracolumbar spine with multilevel facet arthropathy. Grade 1 anterolisthesis of L4 and L5. Bilateral total hip arthroplasties without evidence of hardware complication.  Impression: Nonobstructing renal calculi within the inferior pole of the right kidney, the larger measuring up to 1.0 cm. No hydronephrosis.   Electronically Reviewed by: Toribio Childs, MD, [facility] Electronically Reviewed on: 11/16/2023 6:34 PM  I have reviewed the images and concur with the above  findings.  Electronically Signed by: Shyrl Admire, MD, [facility] Electronically Signed on: 11/16/2023 9:29 PM     Procedures Procedures (including critical care time)  Medications Ordered in UC Medications  ketorolac  (TORADOL ) injection 30 mg (30 mg Intramuscular  Given 11/17/23 1717)    Initial Impression / Assessment and Plan / UC Course  I have reviewed the triage vital signs and the nursing notes.  Pertinent labs & imaging results that were available during my care of the patient were reviewed by me and considered in my medical decision making (see chart for details).   62 year old male with history of asthma, COPD, bilateral hip replacements, chronic lower back pain with spinal cord stimulator, CAD, HIV, HTN, HLD, restless leg syndrome, sleep apnea and psoriasis presents for 1 month history of worsening left lower back/flank pain.  Pain will radiate occasionally to the left hip and lower abdomen.  Reports increased pain with urination in the back but denies burning with urination, frequency, urgency, hematuria, nausea/vomiting.  No numbness, weakness or tingling and no loss of bowel or bladder control.  Seen in ED yesterday but left before getting his results.  See all of patient's lab results and imaging results from emergency department yesterday.  I reviewed the notes as well.  CT scan shows a nonobstructing right renal calculi but no stones on the left side.  Evidence of diverticulosis without diverticulitis.  Spondylitic changes of the thoracolumbar spine with grade 1 anterolisthesis of L4 on L5, multilevel facet arthropathy.  CBC and CMP without any significant findings.  Urinalysis could indicate possible UTI.  Will reobtain urine today.  UA today shows hazy urine with moderate hemoglobin, small bili, trace ketones, protein, trace leukocytes, bacteria and granular casts.  Will send urine for culture.  Suspect patient's symptoms are due to lumbar radiculopathy but cannot  rule out an ascending urinary tract infection as well.  Patient was given 30 mg IM ketorolac  in clinic and I sent prednisone  taper to pharmacy as well as cyclobenzaprine .  Encouraged him to turn his spinal cord stimulator back on and follow-up with his Ortho provider.  Starting patient on Cipro .  Encouraged him to increase his fluid intake.  Reviewed ED precautions.   Final Clinical Impressions(s) / UC Diagnoses   Final diagnoses:  Chronic low back pain without sciatica, unspecified back pain laterality  Left flank pain  Urinary tract infection without hematuria, site unspecified  History of kidney stones     Discharge Instructions      BACK PAIN: Stressed avoiding painful activities . RICE (REST, ICE, COMPRESSION, ELEVATION) guidelines reviewed. May alternate ice and heat. Consider use of muscle rubs, Salonpas patches, etc. Use medications as directed including muscle relaxers if prescribed. Take anti-inflammatory medications as prescribed or OTC NSAIDs/Tylenol .  F/u with PCP or your ortho provider if back pain is not improving.  BACK PAIN RED FLAGS: If the back pain acutely worsens or there are any red flag symptoms such as numbness/tingling, leg weakness, saddle anesthesia, or loss of bowel/bladder control, go immediately to the ER. Follow up with us  as scheduled or sooner if the pain does not begin to resolve or if it worsens before the follow up    UTI: Based on either symptoms or urinalysis, you may have a urinary tract infection. We will send the urine for culture and call with results in a few days. Begin antibiotics at this time. Your symptoms should be much improved over the next 2-3 days. Increase rest and fluid intake. If for some reason symptoms are worsening or not improving after a couple of days or the urine culture determines the antibiotics you are taking will not treat the infection, the antibiotics may be changed. Return or go to ER for fever, back  pain, worsening urinary  pain, discharge, increased blood in urine. May take Tylenol  or Motrin OTC for pain relief or consider AZO if no contraindications      ED Prescriptions     Medication Sig Dispense Auth. Provider   predniSONE  (DELTASONE ) 10 MG tablet Take 6 tabs p.o. on day 1 and decrease by 1 tablet daily until complete 21 tablet Arvis Huxley B, PA-C   cyclobenzaprine  (FLEXERIL ) 10 MG tablet Take 1 tablet (10 mg total) by mouth 3 (three) times daily as needed for muscle spasms. 20 tablet Arvis Huxley NOVAK, PA-C   ciprofloxacin  (CIPRO ) 500 MG tablet Take 1 tablet (500 mg total) by mouth every 12 (twelve) hours for 7 days. 14 tablet Lodie Waheed B, PA-C      PDMP not reviewed this encounter.   Arvis Huxley NOVAK, PA-C 11/17/23 1743

## 2023-11-17 NOTE — Progress Notes (Signed)
 E-Visit for Urinary Problems  Based on what you shared with me, I feel your condition warrants further evaluation and I recommend that you be seen for a face to face office visit.  Male bladder infections are not very common.  We worry about prostate or kidney conditions.  The standard of care is to examine the abdomen and kidneys, and to do a urine and blood test to make sure that something more serious is not going on.  We recommend that you see a provider today.  I do see where you were seen at outside ER and eloped before treatment was given. It does look like they sent in a prescription for Bactrim  for a UTI.  I would recommend though for any other concerns not addressed at that time, that you reach out to your primary care provider and/or be seen at one of [facility] urgent care facilities as we are limited in what we can manage via an e-visit alone.  If your doctor's office is closed [facility] has the following Urgent Cares:   NOTE: There will be NO CHARGE for this E-Visit   If you are having a true medical emergency, please call 911.     For an urgent face to face visit, [place] has multiple urgent care centers for your convenience.  Click the link below for the full list of locations and hours, walk-in wait times, appointment scheduling options and driving directions:  Urgent Care - [place], [place], [place], [place], [place], [place]  [place]     Your MyChart E-visit questionnaire answers were reviewed by a board certified advanced clinical practitioner to complete your personal care plan based on your specific symptoms.  Thank you for using e-Visits.

## 2023-11-17 NOTE — ED Triage Notes (Signed)
 Patient states that he has pain in his left side. Patient has a hx of kidney stones. Patient states that he was at the ED for 13 hrs.

## 2023-11-17 NOTE — Discharge Instructions (Addendum)
 BACK PAIN: Stressed avoiding painful activities . RICE (REST, ICE, COMPRESSION, ELEVATION) guidelines reviewed. May alternate ice and heat. Consider use of muscle rubs, Salonpas patches, etc. Use medications as directed including muscle relaxers if prescribed. Take anti-inflammatory medications as prescribed or OTC NSAIDs/Tylenol .  F/u with PCP or your ortho provider if back pain is not improving.  BACK PAIN RED FLAGS: If the back pain acutely worsens or there are any red flag symptoms such as numbness/tingling, leg weakness, saddle anesthesia, or loss of bowel/bladder control, go immediately to the ER. Follow up with us  as scheduled or sooner if the pain does not begin to resolve or if it worsens before the follow up    UTI: Based on either symptoms or urinalysis, you may have a urinary tract infection. We will send the urine for culture and call with results in a few days. Begin antibiotics at this time. Your symptoms should be much improved over the next 2-3 days. Increase rest and fluid intake. If for some reason symptoms are worsening or not improving after a couple of days or the urine culture determines the antibiotics you are taking will not treat the infection, the antibiotics may be changed. Return or go to ER for fever, back pain, worsening urinary pain, discharge, increased blood in urine. May take Tylenol  or Motrin OTC for pain relief or consider AZO if no contraindications

## 2023-11-18 LAB — URINE CULTURE: Culture: <10000 — AB

## 2023-11-20 ENCOUNTER — Ambulatory Visit: Payer: Self-pay

## 2023-11-20 ENCOUNTER — Ambulatory Visit (HOSPITAL_COMMUNITY): Payer: Self-pay

## 2023-11-20 NOTE — Telephone Encounter (Signed)
 This RN made first attempt to reach patient regarding rescheduling his 9/16 appointment. Appointment was scheduled as acute but needs to be rescheduled as a New Pt TOC. Left message with callback number

## 2023-11-24 ENCOUNTER — Encounter

## 2023-11-24 ENCOUNTER — Ambulatory Visit (INDEPENDENT_AMBULATORY_CARE_PROVIDER_SITE_OTHER)

## 2023-11-24 VITALS — BP 110/70 | HR 92 | Temp 98.2°F | Ht 70.0 in | Wt 136.4 lb

## 2023-11-24 DIAGNOSIS — J439 Emphysema, unspecified: Secondary | ICD-10-CM | POA: Diagnosis not present

## 2023-11-24 DIAGNOSIS — I152 Hypertension secondary to endocrine disorders: Secondary | ICD-10-CM

## 2023-11-24 DIAGNOSIS — R309 Painful micturition, unspecified: Secondary | ICD-10-CM

## 2023-11-24 DIAGNOSIS — R3916 Straining to void: Secondary | ICD-10-CM

## 2023-11-24 DIAGNOSIS — G8929 Other chronic pain: Secondary | ICD-10-CM | POA: Diagnosis not present

## 2023-11-24 DIAGNOSIS — M545 Low back pain, unspecified: Secondary | ICD-10-CM | POA: Diagnosis not present

## 2023-11-24 DIAGNOSIS — G2581 Restless legs syndrome: Secondary | ICD-10-CM | POA: Diagnosis not present

## 2023-11-24 DIAGNOSIS — G4700 Insomnia, unspecified: Secondary | ICD-10-CM | POA: Diagnosis not present

## 2023-11-24 DIAGNOSIS — Z21 Asymptomatic human immunodeficiency virus [HIV] infection status: Secondary | ICD-10-CM

## 2023-11-24 DIAGNOSIS — I252 Old myocardial infarction: Secondary | ICD-10-CM

## 2023-11-24 DIAGNOSIS — I700 Atherosclerosis of aorta: Secondary | ICD-10-CM

## 2023-11-24 DIAGNOSIS — E118 Type 2 diabetes mellitus with unspecified complications: Secondary | ICD-10-CM

## 2023-11-24 DIAGNOSIS — E1159 Type 2 diabetes mellitus with other circulatory complications: Secondary | ICD-10-CM

## 2023-11-24 DIAGNOSIS — E1169 Type 2 diabetes mellitus with other specified complication: Secondary | ICD-10-CM

## 2023-11-24 DIAGNOSIS — Z1329 Encounter for screening for other suspected endocrine disorder: Secondary | ICD-10-CM

## 2023-11-24 DIAGNOSIS — R9389 Abnormal findings on diagnostic imaging of other specified body structures: Secondary | ICD-10-CM

## 2023-11-24 DIAGNOSIS — E782 Mixed hyperlipidemia: Secondary | ICD-10-CM | POA: Diagnosis not present

## 2023-11-24 DIAGNOSIS — M47812 Spondylosis without myelopathy or radiculopathy, cervical region: Secondary | ICD-10-CM

## 2023-11-24 MED ORDER — TRAZODONE HCL 50 MG PO TABS: 50.0000 mg | ORAL_TABLET | Freq: Every evening | ORAL | 3 refills | Status: AC | PRN

## 2023-11-24 MED ORDER — EZETIMIBE 10 MG PO TABS: 10.0000 mg | ORAL_TABLET | Freq: Every day | ORAL | 3 refills | Status: AC

## 2023-11-24 MED ORDER — ROPINIROLE HCL 1 MG PO TABS: 2.0000 mg | ORAL_TABLET | Freq: Every day | ORAL | 3 refills | Status: AC

## 2023-11-24 MED ORDER — TAMSULOSIN HCL 0.4 MG PO CAPS: 0.4000 mg | ORAL_CAPSULE | Freq: Every day | ORAL | 3 refills | Status: AC

## 2023-11-24 MED ORDER — DULOXETINE HCL 60 MG PO CPEP: 60.0000 mg | ORAL_CAPSULE | Freq: Every day | ORAL | 3 refills | Status: AC

## 2023-11-24 MED ORDER — PRAVASTATIN SODIUM 40 MG PO TABS: ORAL_TABLET | ORAL | 3 refills | Status: AC

## 2023-11-24 NOTE — Patient Instructions (Addendum)
 1) Please call the following number to schedule an appointment with your urologist at [facility]:   Mardeen Debby Faden, PA Urology [address] MEDICINE [place] [address] Phone: [phone]    2) If you continue to have recurrent urinary tract infection, we will have to see your ID doctor at [facility] sooner than your scheduled appointment in 04/21/2024.    3) I am starting you on a medication that can help with urinary straining, take Flomax  0.4 mg, one tab at bedtime.   4) I will update you of urine and blood work result.   5) Increase water  intake to 50-60 oz of water .

## 2023-11-24 NOTE — Assessment & Plan Note (Signed)
 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 Differential includes ongoing infection or prostate issues. Order urinalysis and urine culture. Start tamsulosin  0.4 mg at bedtime. Encourage follow-up with urologist, Dr. Debby Abate at [facility].

## 2023-11-24 NOTE — Assessment & Plan Note (Signed)
 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 Differential includes ongoing infection or prostate issues. Order urinalysis and urine culture. Start tamsulosin  0.4 mg at bedtime. Encourage follow-up with urologist, Dr. Debby Abate at [facility].

## 2023-11-24 NOTE — Assessment & Plan Note (Signed)
 Chronic, persistent, with right sided shoulder pain. Recommend PMR evaluation. Referral made.

## 2023-11-24 NOTE — Assessment & Plan Note (Signed)
 HIV on treatment with Biktarvy managed by [facility] specialist Dr. Tanda, continue close f/u with infectious disease. Given this patient is at risk of opportunistic infection. Recent UTI like symptoms improved with [id number]-SMX, repeat UA, urine culture. Management pending result. If culture positive strongly recommend patient to f/u with ID, sooner than scheduled appointment in 04/2024

## 2023-11-24 NOTE — Assessment & Plan Note (Signed)
 Chronic and mostly stable on Trazodone  50 mg. Continue.  Consider checking iron panel as patient continues to be bothered by restless legs syndrome despite taking Ropinirole  1 mg daily. Increase Ropinirole  2 mg daily, new prescription sent.

## 2023-11-24 NOTE — Assessment & Plan Note (Signed)
 Chronic, continue Zetia  10 mg and Pravachol  40 mg daily

## 2023-11-24 NOTE — Assessment & Plan Note (Addendum)
 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 Is interested in PMR evaluation and treatment. Referral made. Continue Duloxetine  60 mg daily.

## 2023-11-24 NOTE — Assessment & Plan Note (Signed)
 Chronic, somewhat controlled but could improve.  Consider checking iron panel as patient continues to be bothered by restless legs syndrome despite taking Ropinirole  1 mg daily. Increase Ropinirole  2 mg daily, new prescription sent. Ropinirole 

## 2023-11-24 NOTE — Progress Notes (Signed)
 Established Patient Office Visit TOC from Dr. Hope   Subjective  Patient ID: Samuel Burnett Nose, male    DOB: Feb 21, 1961  Age: 63 y.o. MRN: [id number]  Chief Complaint  Patient presents with   Establish Care    He  has a past medical history of Abnormal weight loss (11/19/2015), Acute postoperative pain (02/27/2020), Allergy, Anemia, Annual physical exam (05/17/2018), Anterior tibialis tendonitis of left leg (10/19/2020), Anxiety and depression, Anxiety and depression (09/12/2014), Aortic atherosclerosis (HCC), Aortic root dilatation (HCC), Asthma, Avascular necrosis of bones of both hips (HCC), 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) (06/16/2018), Bronchitis (09/21/2014), CAD (coronary artery disease) (04/02/2016), Chronic hip pain (Primary Area of Pain) (Bilateral) (R>L) (05/17/2018), Chronic low back pain, Chronic low back pain (Tertiary Area of Pain) (Bilateral) (R>L) w/ sciatica  (Bilateral) (05/17/2018), Chronic lower extremity pain (Secondary Area of Pain) (Bilateral) (R>L) (05/17/2018), Chronic musculoskeletal pain (06/16/2018), Colon polyps, Controlled insomnia, COPD (chronic obstructive pulmonary disease) (HCC), COPD (chronic obstructive pulmonary disease) (HCC) (09/05/2020), Coronary artery disease, Discogenic low back pain (09/09/2012), Disorder of skeletal system (05/17/2018), Eczema, Emphysema of lung (HCC) (09/22/2016), Encounter for long-term (current) use of medications (12/11/2016), Encounter for medication monitoring (11/19/2015), Encounter for screening for lung cancer (03/22/2022), Erectile dysfunction, Foraminal stenosis of cervical region (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), Framingham cardiac risk 10-20% in next 10 years (06/09/2019), Grade I diastolic dysfunction, Grief (01/28/2021), Ground glass opacity present on imaging of lung (09/22/2016), H/O syncope (11/16/2017), History of chicken pox, History of colonic polyps, History of concussion (04/19/2013), History of kidney stones, History of  marijuana use, History of neutropenia (11/16/2017), History of tobacco use (05/17/2018), HIV (human immunodeficiency virus infection) (HCC) (09/22/2016), HIV infection (HCC) (1995), colonic polyps, myocardial infarction (12/20/2015), 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), Hyperlipidemia, Hypertension, Late latent syphilis, 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), Long term current use of opiate analgesic (05/17/2018), Long term systemic steroid user (12/20/2015), Lower back pain, Lumbar facet arthropathy (Bilateral) (06/16/2018), Lumbar facet syndrome (Bilateral) (06/16/2018), Lumbar spondylosis (06/16/2018), Lymphocytosis (05/21/2021), Neck pain (09/30/2019), Need for vaccination (04/02/2017), Neutropenia (HCC), Numbness in right leg, Osteoarthritis, Other forms of scoliosis, thoracolumbar region, Personal history of tobacco use, presenting hazards to health (01/20/2016), Pneumonia, Postoperative urinary retention (09/06/2020), Pre-diabetes, Prediabetes (01/31/2020), Preventative health care (12/20/2015), Primary osteoarthritis of left hip (09/05/2020), Problems influencing health status (05/17/2018), Psoriasis, Restless leg syndrome, Screening examination for sexually transmitted disease (10/05/2014), Seizures, 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  HPI Patient with a known history HIV on treatment with Biktarvy managed by [facility] specialist Dr. Tanda presents for [facility] from previous PCP, chronic medication management as well as to discuss acute concerns:   - Urinary tract infection treated with TMP-SMX from 11/17/23-11/24/23 with subsequent urine culture with less than 10,000 growth. Reports of ongoing chronic b/l lower back  pain, left worse than right that has been bothersome since his symptoms started.  Patient reports he finished antibiotic and noticed improvement in symptoms including hematuria, foul smelling urine but continues to have urinary straining, left flank, lower back pain. No fever, chills.   - He has a h/o b/l avascular necrosis with s/p surgery. Denies concerns with hip pain. Has spinal cord stimulator pack in right buttock region as well. Not current seeing PT, PMR. Is interested in seeing PMR. Also reports of chronic neck, left shoulder pain.   - Had A1c  on diabetic range back in 07/19/2020, diet controlled. Since then A1c has been in prediabetic range.   - Denies known h/o asthma, but has dx of bronchitis listed (spirometry last done on 11/04/16 was reassuring based on patient's medical records/documentation from Dr. Linard, emphysema noted on low dose lung CT on 06/03/23).  he has been prescribed Albuterol  inhaler, patient reports he gets seasonal wheezing and uses this prn. He also has Fluticasone -Vilanterol prescribed to him back in 2022. Was prescribed Singular in the past, developed itching so stopped taking it.   - Patient does have a h/o smoking, currently not smoking.  Last low dose lung CT: 06/03/23: Annual f/u recommended  Three-vessel coronary atherosclerosis.  Aortic Atherosclerosis  Emphysema  Simple 0.9 cm central right liver cyst.  Previous R upper lobe pulmonary nodule from 11/12/22 resolved on this CT.   - He is currently taking Amlodipine  2.5 mg daily for hypertension. For chronic pain, anxiety he takes Duloxetine  60 mg daily. He has RLL and is taking Ropinirole  1 mg at bedtime. Patient reports of not optimal symptoms control of RLL. He is on Zetia  10 mg and Pravastatin  40 mg daily for hyperlipidemia. Takes Trazodone  50 mg to help with sleep.    ROS As per HPI    Objective:     BP 110/70 (BP Location: Right Arm, Patient Position: Sitting, Cuff Size: Small)   Pulse 92   Temp  98.2 F (36.8 C) (Oral)   Ht 5' 10 (1.778 m)   Wt 136 lb 6.4 oz (61.9 kg)   SpO2 91%   BMI 19.57 kg/m      11/24/2023    3:24 PM 05/28/2023   10:26 AM 10/08/2022    9:46 AM  Depression screen PHQ 2/9  Decreased Interest 0 0 0  Down, Depressed, Hopeless 0 0 0  PHQ - 2 Score 0 0 0  Altered sleeping 2 1 1   Tired, decreased energy 0 0 0  Change in appetite 3 2 1   Feeling bad or failure about yourself  0 0 0  Trouble concentrating 0 0 0  Moving slowly or fidgety/restless 0 0 0  Suicidal thoughts 0 0 0  PHQ-9 Score 5 3 2   Difficult doing work/chores  Not difficult at all Somewhat difficult      11/24/2023    3:25 PM 05/28/2023   10:26 AM 10/08/2022    9:46 AM 08/20/2022    9:16 AM  GAD 7 : Generalized Anxiety Score  Nervous, Anxious, on Edge 1 0 2 3  Control/stop worrying 0 0 2 1  Worry too much - different things 0 0 0 0  Trouble relaxing 1 2 2 1   Restless 2 0 1 2  Easily annoyed or irritable 0 1 1 1   Afraid - awful might happen 0 0 1 0  Total GAD 7 Score 4 3 9 8   Anxiety Difficulty Not difficult at all  Somewhat difficult Not difficult at all      11/24/2023    3:24 PM 05/28/2023   10:26 AM 10/08/2022    9:46 AM  Depression screen PHQ 2/9  Decreased Interest 0 0 0  Down, Depressed, Hopeless 0 0 0  PHQ - 2 Score 0 0 0  Altered sleeping 2 1 1   Tired, decreased energy 0 0 0  Change in appetite 3 2 1   Feeling bad or failure about yourself  0 0 0  Trouble concentrating 0 0 0  Moving slowly or fidgety/restless 0 0 0  Suicidal thoughts 0 0 0  PHQ-9 Score 5 3 2   Difficult doing work/chores  Not difficult at all Somewhat difficult      11/24/2023    3:25 PM 05/28/2023   10:26 AM 10/08/2022    9:46 AM 08/20/2022    9:16 AM  GAD 7 : Generalized Anxiety Score  Nervous, Anxious, on Edge 1 0 2 3  Control/stop worrying 0 0 2 1  Worry too much - different things 0 0 0 0  Trouble relaxing 1 2 2 1   Restless 2 0 1 2  Easily annoyed or irritable 0 1 1 1   Afraid - awful might  happen 0 0 1 0  Total GAD 7 Score 4 3 9 8   Anxiety Difficulty Not difficult at all  Somewhat difficult Not difficult at all   SDOH Screenings   Food Insecurity: Food Insecurity Present (11/23/2023)  Housing: Low Risk  (11/23/2023)  Transportation Needs: No Transportation Needs (11/23/2023)  Utilities: Not At Risk (08/07/2023)   Received from [facility]  Alcohol Screen: Low Risk  (11/23/2023)  Depression (PHQ2-9): Medium Risk (11/24/2023)  Financial Resource Strain: Medium Risk (11/23/2023)  Physical Activity: Insufficiently Active (11/23/2023)  Social Connections: Socially Isolated (11/23/2023)  Stress: Stress Concern Present (10/01/2022)  Tobacco Use: Medium Risk (11/24/2023)  Health Literacy: Adequate Health Literacy (10/01/2022)     Physical Exam Constitutional:      General: He is not in acute distress. HENT:     Head: Normocephalic and atraumatic.     Mouth/Throat:     Mouth: Mucous membranes are moist.     Pharynx: Oropharynx is clear. No oropharyngeal exudate.  Cardiovascular:     Rate and Rhythm: Normal rate.     Pulses: Normal pulses.  Pulmonary:     Effort: Pulmonary effort is normal.     Breath sounds: Normal breath sounds.  Abdominal:     General: Bowel sounds are normal.     Palpations: Abdomen is soft.     Tenderness: There is abdominal tenderness (on deep palpation over left flank). There is no right CVA tenderness, left CVA tenderness, guarding or rebound.  Musculoskeletal:     Lumbar back: Tenderness (balpation over b/l lumbar paraspinal muscle iliciting pain) present. Negative right straight leg raise test and negative left straight leg raise test.     Right lower leg: No edema.     Left lower leg: No edema.  Skin:    General: Skin is warm.  Neurological:     Mental Status: He is alert and oriented to person, place, and time.  Psychiatric:        Mood and Affect: Mood normal.        No results found for any visits on 11/24/23.  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 Differential includes ongoing infection or prostate issues. Order urinalysis and urine culture. Start tamsulosin  0.4 mg at bedtime. Encourage follow-up with urologist, Dr. Debby Abate at [facility].  Orders: -     Urinalysis, Routine w reflex microscopic -     Urine Culture -     Tamsulosin  HCl; Take 1 capsule (0.4 mg total) by mouth daily.  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 Differential includes ongoing infection or prostate issues. Order urinalysis and urine culture. Start tamsulosin  0.4 mg at bedtime. Encourage follow-up with urologist, Dr. Debby Abate at [facility].  Orders: -     Urinalysis, Routine w reflex microscopic -     Urine Culture  HIV infection, unspecified symptom status (HCC) Assessment & Plan: HIV on treatment with Biktarvy managed by [facility] specialist Dr. Tanda, continue close f/u with infectious disease. Given this patient is at risk of opportunistic infection. Recent UTI like symptoms improved with TMP-SMX, repeat UA, urine culture. Management pending result. If culture positive strongly recommend patient to f/u with ID, sooner than scheduled appointment in 04/2024   Type 2 diabetes with complication [facility]) Assessment & Plan: Diet controlled, one episode of A1c on borderline diabetic range, never needing treatment for DM. Will check A1c   Orders: -     Hemoglobin A1c -     Basic metabolic panel with GFR  Other chronic pain Assessment & Plan: Plan per cervical arthritis.   Orders: -     DULoxetine  HCl; Take 1 capsule  (60 mg total) by mouth daily.  Dispense: 90 capsule; Refill: 3  Mixed hyperlipidemia Assessment & Plan: Chronic, continue Zetia  10 mg and Pravachol  40 mg daily   Orders: -     Ezetimibe ; Take 1 tablet (10 mg total) by mouth daily.  Dispense: 90 tablet; Refill: 3 -     Pravastatin  Sodium; TAKE 1 TABLET BY MOUTH  DAILY AT NIGHT  Dispense: 90 tablet; Refill: 3  RLS (restless legs syndrome) Assessment & Plan: Chronic, somewhat controlled but could improve.  Consider checking iron panel as patient continues to be bothered by restless legs syndrome despite taking Ropinirole  1 mg daily. Increase Ropinirole  2 mg daily, new prescription sent. Ropinirole   Orders: -     rOPINIRole  HCl; Take 2 tablets (2 mg total) by mouth at bedtime.  Dispense: 180 tablet; Refill: 3  Insomnia, unspecified type Assessment & Plan: Chronic and mostly stable on Trazodone  50 mg. Continue.  Consider checking iron panel as patient continues to be bothered by restless legs syndrome despite taking Ropinirole  1 mg daily. Increase Ropinirole  2 mg daily, new prescription sent.   Orders: -     traZODone  HCl; Take 1 tablet (50 mg total) by mouth at bedtime as needed. for sleep  Dispense: 90 tablet; Refill: 3  Chronic left shoulder pain Assessment & Plan: Plan per cervical arthritis.   Orders: -     Ambulatory referral to Physical Medicine Rehab  Chronic bilateral low back pain without sciatica Assessment & Plan: Chronic, h/o avascular necrosis b/l hip s/p surgery, pain likely multifactorial. Is interested in PMR evaluation and treatment. Referral made. Continue Duloxetine  60 mg daily.   Orders: -     Ambulatory referral to Physical Medicine Rehab  Pulmonary emphysema, unspecified emphysema type (HCC) Assessment & Plan: Chronic.  Stable.  Risk: hh/o smoking. Currently on Albuterol  prn, has tried Breo Ellipta  in the past. If patient develops symptoms highly recommend repeating PFT and medication adjustment.      Abnormal CT of the chest Assessment & Plan: Continue annual low dose CT lung cancer screening.    Aortic atherosclerosis (HCC) Assessment & Plan: Chronic. Lifestyle modifications including cholesterol, blood pressure management recommended.   Goal LDL less than 70. Continue Pravachol  40 mg and Zetia  10 mg daily. Refill sent.      Cervical arthritis Assessment &  Plan: Chronic, persistent, with right sided shoulder pain. Recommend PMR evaluation. Referral made.    Hypertension associated with diabetes (HCC) Assessment & Plan: Blood pressure controlled at 128/70 mmHg with Amlodipine  2.5 mg daily, continue. Check CMP     Return in about 3 months (around 02/23/2024) for Chronic follow up .   Luke Shade, MD

## 2023-11-24 NOTE — Assessment & Plan Note (Signed)
 Diet controlled, one episode of A1c on borderline diabetic range, never needing treatment for DM. Will check A1c

## 2023-11-24 NOTE — Assessment & Plan Note (Signed)
 Blood pressure controlled at 128/70 mmHg with Amlodipine  2.5 mg daily, continue. Check CMP

## 2023-11-24 NOTE — Assessment & Plan Note (Signed)
 Chronic.  Stable.  Risk: hh/o smoking. Currently on Albuterol  prn, has tried Breo Ellipta  in the past. If patient develops symptoms highly recommend repeating PFT and medication adjustment.

## 2023-11-24 NOTE — Assessment & Plan Note (Signed)
 Plan per cervical arthritis.

## 2023-11-24 NOTE — Assessment & Plan Note (Addendum)
 Chronic. Lifestyle modifications including cholesterol, blood pressure management recommended.   Goal LDL less than 70. Continue Pravachol  40 mg and Zetia  10 mg daily. Refill sent.

## 2023-11-24 NOTE — Assessment & Plan Note (Signed)
 Continue annual low dose CT lung cancer screening.

## 2023-11-25 ENCOUNTER — Ambulatory Visit: Payer: Self-pay

## 2023-11-25 LAB — URINALYSIS, ROUTINE W REFLEX MICROSCOPIC
Bilirubin Urine: NEGATIVE
Ketones, ur: NEGATIVE
Nitrite: NEGATIVE
Specific Gravity, Urine: 1.01 (ref 1.000–1.030)
Total Protein, Urine: NEGATIVE
Urine Glucose: NEGATIVE
Urobilinogen, UA: 0.2 (ref 0.0–1.0)
pH: 6 (ref 5.0–8.0)

## 2023-11-25 LAB — BASIC METABOLIC PANEL WITH GFR
BUN: 17 mg/dL (ref 6–23)
CO2: 27 meq/L (ref 19–32)
Calcium: 9 mg/dL (ref 8.4–10.5)
Chloride: 105 meq/L (ref 96–112)
Creatinine, Ser: 1.05 mg/dL (ref 0.40–1.50)
GFR: 74.62 mL/min (ref >60.00)
Glucose, Bld: 71 mg/dL (ref 70–99)
Potassium: 4.3 meq/L (ref 3.5–5.1)
Sodium: 140 meq/L (ref 135–145)

## 2023-11-25 LAB — HEMOGLOBIN A1C: Hgb A1c MFr Bld: 6.5 % (ref 4.6–6.5)

## 2023-11-25 LAB — URINE CULTURE
MICRO NUMBER:: 16974180
Result:: NO GROWTH
SPECIMEN QUALITY:: ADEQUATE

## 2023-11-25 NOTE — Progress Notes (Signed)
 Can we please reach out to [facility] urology at  [phone] to help schedule an appointment for this patient sooner than 01/19/24 with Mardeen Debby Faden, PA  (already established with him) for dysuria, abnormal UA.   As well as [facility] at [phone] Tanda Ade, MD to let them know patient's UA could suggest HIV medication side effect and needs to follow up with them sooner than 04/21/2024.   Thank you,  Luke Shade, MD

## 2023-11-26 NOTE — Telephone Encounter (Signed)
 Noted.  Samuel Mascot, MD

## 2023-12-12 ENCOUNTER — Ambulatory Visit
Admission: EM | Admit: 2023-12-12 | Discharge: 2023-12-12 | Disposition: A | Discharge status: Home / Self Care | Attending: Family Medicine | Admitting: Family Medicine

## 2023-12-12 DIAGNOSIS — Z77120 Contact with and (suspected) exposure to mold (toxic): Secondary | ICD-10-CM | POA: Diagnosis not present

## 2023-12-12 DIAGNOSIS — K529 Noninfective gastroenteritis and colitis, unspecified: Secondary | ICD-10-CM | POA: Diagnosis not present

## 2023-12-12 DIAGNOSIS — J441 Chronic obstructive pulmonary disease with (acute) exacerbation: Secondary | ICD-10-CM

## 2023-12-12 MED ORDER — AZITHROMYCIN 250 MG PO TABS: ORAL_TABLET | ORAL | 0 refills | Status: DC

## 2023-12-12 MED ORDER — ONDANSETRON 4 MG PO TBDP: 4.0000 mg | ORAL_TABLET | Freq: Three times a day (TID) | ORAL | 0 refills | Status: AC | PRN

## 2023-12-12 MED ORDER — PREDNISONE 20 MG PO TABS: 40.0000 mg | ORAL_TABLET | Freq: Every day | ORAL | 0 refills | Status: AC

## 2023-12-12 NOTE — ED Triage Notes (Signed)
 Pt c/o emesis,chills,cough & constipation x4 days. LBM:5 days ago. States was exposed to mold in his house. Also c/o sob that comes & goes. Hx of asthma & COPD. No OTC meds.
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
## 2023-12-12 NOTE — Discharge Instructions (Signed)
 Stop by the pharmacy to pick up your prescriptions.  Follow up with your primary care provider to get the process started regarding your mold health concerns.  You may need to see a lung doctor.

## 2023-12-14 ENCOUNTER — Ambulatory Visit: Payer: Self-pay

## 2023-12-14 NOTE — Telephone Encounter (Signed)
 FYI Only or Action Required?: Action required by provider: Please see notes.  Patient was last seen in primary care on 11/24/2023 by Abbey Bruckner, MD.  Called Nurse Triage reporting Shortness of Breath.  Symptoms began several days ago.  Interventions attempted: Other: Patient seen at [facility] yesterday and given medication.  Symptoms are: unchanged.  Triage Disposition: Call PCP Now  Patient/caregiver understands and will follow disposition?: Yes    Copied from CRM [id number]. Topic: Clinical - Red Word Triage >> Dec 14, 2023  9:22 AM Antony RAMAN wrote: Red Word that prompted transfer to Nurse Triage: diarrhea, fever at night, vomiting, chest congestion & shortness of breath. thinks maybe mold poisoning from house. Body pain Answer Assessment - Initial Assessment Questions Patient found black mold in home on Thursday of last week. Patient went to UC yesterday and was prescribed medication and told to follow up with PCP asap for further testing. No availability with PCP for this week. Placed on wait list. Please contact patient for follow up. Advised patient to call 911 if he feels restricted breathing, chest pain/tightness.   1. RESPIRATORY STATUS: Describe your breathing? (e.g., wheezing, shortness of breath, unable to speak, severe coughing)      Patient feels shortness of breath right now, coughing up grey phlegm 2. ONSET: When did this breathing problem begin?      5 days 3. PATTERN Does the difficult breathing come and go, or has it been constant since it started?     constant 4. SEVERITY: How bad is your breathing? (e.g., mild, moderate, severe)      Patient states he is having trouble breathing 8. CAUSE: What do you think is causing the breathing problem?      Patient states he suspects its black mold poisoning, he found black mold on Thursday 9. OTHER SYMPTOMS: Do you have any other symptoms? (e.g., chest pain, cough, dizziness, fever, runny nose)     Fever at night,  diarrhea, vomiting, chest congestion, body aches 10. O2 SATURATION MONITOR:  Do you use an oxygen saturation monitor (pulse oximeter) at home? If Yes, ask: What is your reading (oxygen level) today? What is your usual oxygen saturation reading? (e.g., 95%)       97% at the appt at [facility] yesterday  Protocols used: Breathing Difficulty-A-AH

## 2023-12-14 NOTE — Telephone Encounter (Signed)
 Patient is scheduled to see Chelsea Aurora on 12/15/23 at 1:20.

## 2023-12-15 ENCOUNTER — Other Ambulatory Visit: Payer: Self-pay

## 2023-12-15 ENCOUNTER — Ambulatory Visit (INDEPENDENT_AMBULATORY_CARE_PROVIDER_SITE_OTHER): Admitting: Nurse Practitioner

## 2023-12-15 ENCOUNTER — Encounter: Payer: Self-pay | Admitting: Physical Medicine & Rehabilitation

## 2023-12-15 ENCOUNTER — Emergency Department
Admission: EM | Admit: 2023-12-15 | Discharge: 2023-12-15 | Disposition: A | Discharge status: Home / Self Care | Attending: Emergency Medicine | Admitting: Emergency Medicine

## 2023-12-15 ENCOUNTER — Encounter: Payer: Self-pay | Admitting: Nurse Practitioner

## 2023-12-15 ENCOUNTER — Emergency Department

## 2023-12-15 VITALS — BP 108/68 | HR 78 | Temp 97.9°F | Ht 70.0 in | Wt 132.2 lb

## 2023-12-15 DIAGNOSIS — J449 Chronic obstructive pulmonary disease, unspecified: Secondary | ICD-10-CM | POA: Insufficient documentation

## 2023-12-15 DIAGNOSIS — R0602 Shortness of breath: Secondary | ICD-10-CM | POA: Insufficient documentation

## 2023-12-15 DIAGNOSIS — R5383 Other fatigue: Secondary | ICD-10-CM | POA: Insufficient documentation

## 2023-12-15 DIAGNOSIS — E119 Type 2 diabetes mellitus without complications: Secondary | ICD-10-CM | POA: Diagnosis not present

## 2023-12-15 DIAGNOSIS — I10 Essential (primary) hypertension: Secondary | ICD-10-CM | POA: Diagnosis not present

## 2023-12-15 DIAGNOSIS — K529 Noninfective gastroenteritis and colitis, unspecified: Secondary | ICD-10-CM | POA: Diagnosis not present

## 2023-12-15 DIAGNOSIS — R059 Cough, unspecified: Secondary | ICD-10-CM | POA: Diagnosis not present

## 2023-12-15 DIAGNOSIS — Z77120 Contact with and (suspected) exposure to mold (toxic): Secondary | ICD-10-CM | POA: Diagnosis not present

## 2023-12-15 DIAGNOSIS — R109 Unspecified abdominal pain: Secondary | ICD-10-CM

## 2023-12-15 DIAGNOSIS — R197 Diarrhea, unspecified: Secondary | ICD-10-CM | POA: Diagnosis not present

## 2023-12-15 LAB — BASIC METABOLIC PANEL WITH GFR
Anion gap: 9 (ref 5–15)
BUN: 16 mg/dL (ref 8–23)
CO2: 25 mmol/L (ref 22–32)
Calcium: 9.4 mg/dL (ref 8.9–10.3)
Chloride: 103 mmol/L (ref 98–111)
Creatinine, Ser: 1.15 mg/dL (ref 0.61–1.24)
GFR, Estimated: >60 mL/min (ref >60)
Glucose, Bld: 136 mg/dL — ABNORMAL HIGH (ref 70–99)
Potassium: 4 mmol/L (ref 3.5–5.1)
Sodium: 137 mmol/L (ref 135–145)

## 2023-12-15 LAB — CBC
HCT: 42.8 % (ref 39.0–52.0)
Hemoglobin: 14.8 g/dL (ref 13.0–17.0)
MCH: 33 pg (ref 26.0–34.0)
MCHC: 34.6 g/dL (ref 30.0–36.0)
MCV: 95.3 fL (ref 80.0–100.0)
Platelets: 200 K/uL (ref 150–400)
RBC: 4.49 MIL/uL (ref 4.22–5.81)
RDW: 13.4 % (ref 11.5–15.5)
WBC: 4.4 K/uL (ref 4.0–10.5)
nRBC: 0 % (ref 0.0–0.2)

## 2023-12-15 LAB — BRAIN NATRIURETIC PEPTIDE: B Natriuretic Peptide: 13.3 pg/mL (ref 0.0–100.0)

## 2023-12-15 LAB — RESP PANEL BY RT-PCR (RSV, FLU A&B, COVID)  RVPGX2
Influenza A by PCR: NEGATIVE
Influenza B by PCR: NEGATIVE
Resp Syncytial Virus by PCR: NEGATIVE
SARS Coronavirus 2 by RT PCR: NEGATIVE

## 2023-12-15 LAB — TROPONIN I (HIGH SENSITIVITY): Troponin I (High Sensitivity): 3 ng/L (ref <18)

## 2023-12-15 NOTE — Assessment & Plan Note (Addendum)
 SOB, Fatigue, weight loss, vomiting, diarrhea and decreased appetite. -Need stat labs, work up and IV hydration, advised to go to ED for  further evaluation.

## 2023-12-15 NOTE — Assessment & Plan Note (Signed)
 SOB, Fatigue, weight loss, vomiting, diarrhea and decreased appetite. -Need stat labs, work up and IV hydration, advised to go to ED for  further evaluation.

## 2023-12-15 NOTE — Assessment & Plan Note (Addendum)
 Acute respiratory symptoms with sensation of throat constriction.  Oxygen saturation improved from 93% to 96%.  Given patient symptoms SOB, feeling of throat constriction, diarrhea, vomiting, reduced appetite and immunocompromised history, advised pt to go to ED for further evaluation and management.    Pt is agreeable with the plan.

## 2023-12-15 NOTE — ED Triage Notes (Signed)
 Pt sts that he has been having cough with SOB for the last seven days. Pt was advised to come and be seen from his PCP due to the exposure risk. Pt showed RN a video as to what appears to have a mold type substance on his furniture as well as under the house that he is renting from.

## 2023-12-15 NOTE — ED Provider Notes (Signed)
 [facility] Provider Note    Event Date/Time   First MD Initiated Contact with Patient 12/15/23 1637     (approximate)   History   Mold Exposure   HPI  Samuel Burnett is a 63 y.o. male history of hypertension, hyperlipidemia, diabetes, restless leg syndrome, COPD, chronic pain, presenting with shortness of breath and cough for the past week.  Also with diarrhea and abdominal pain.  States that he only feels short of breath when he is in the house and the throat tightness when he is in the house.  States that he rents the house, house, has black mold externally and under the floors, states that he went away for work and came back and noticed mold on his equipment and inside the house.  States that he has told his landlord about it but has not had any mitigation done.  States that his HIV is well-controlled, viral load is undetectable, CD4 count is high.    On independent chart review, he was seen at primary care today for shortness of breath, congestion and cough, also noted productive sputum.  States that he found mold in his house.  Had also been experiencing nausea vomiting and diarrhea as well as decreased appetite.  Was seen in urgent care on the fourth and started on Z-Pak, prednisone , Zofran .  States his symptoms have not improved.  Does have history of HIV on Biktarvy.  His oxygen saturations at clinic was 93-96%  Physical Exam   Triage Vital Signs: ED Triage Vitals [12/15/23 1543]  Encounter Vitals Group     BP 108/78     Girls Systolic BP Percentile      Girls Diastolic BP Percentile      Boys Systolic BP Percentile      Boys Diastolic BP Percentile      Pulse Rate 95     Resp 17     Temp 98.3 F (36.8 C)     Temp Source Oral     SpO2 95 %     Weight 132 lb 3.2 oz (60 kg)     Height 5' 10 (1.778 m)     Head Circumference      Peak Flow      Pain Score 0     Pain Loc      Pain Education      Exclude from Growth Chart     Most recent  vital signs: Vitals:   12/15/23 1543  BP: 108/78  Pulse: 95  Resp: 17  Temp: 98.3 F (36.8 C)  SpO2: 95%     General: Awake, no distress.  CV:  Good peripheral perfusion.  Resp:  Normal effort.  Clear Abd:  No distention.  Soft nontender Other:  Moving all 4 extremities without focal weakness, he is clear oropharynx without tonsillar exudates or unilateral swelling   ED Results / Procedures / Treatments   Labs (all labs ordered are listed, but only abnormal results are displayed) Labs Reviewed  BASIC METABOLIC PANEL WITH GFR - Abnormal; Notable for the following components:      Result Value   Glucose, Bld 136 (*)    All other components within normal limits  RESP PANEL BY RT-PCR (RSV, FLU A&B, COVID)  RVPGX2  CBC  BRAIN NATRIURETIC PEPTIDE  TROPONIN I (HIGH SENSITIVITY)     EKG  EKG shows, sinus rhythm, rate of 74, normal QS, normal QTc, no obvious ischemic ST elevation, T wave version to aVL,  V2, T wave inversions new compared to prior   RADIOLOGY On my independent interpretation, chest x-ray without obvious consolidation   PROCEDURES:  Critical Care performed: No  Procedures   MEDICATIONS ORDERED IN ED: Medications - No data to display   IMPRESSION / MDM / ASSESSMENT AND PLAN / ED COURSE  I reviewed the triage vital signs and the nursing notes.                              Differential diagnosis includes, but is not limited to, allergic reaction, mold exposure, COPD exacerbation, viral illness, pneumonia, arrhythmia, atypical ACS.  Labs, EKG, troponin, chest x-ray, respiratory viral panel.  Patient's presentation is most consistent with acute presentation with potential threat to life or bodily function.  Independent interpretation of labs and imaging below.  Labs and imaging are reassuring, considered but no indication for further workup or inpatient admission at this time, he safe for outpatient management.  Given that his symptoms come on when he  is in the house, he likely needs to get the mold mitigated or find in the place to stay.  Patient is aware of this and will reach out to his landlord to further discuss.  Discussed with him about with primary care next week to get reassessed.  He is agreeable with this plan.  Discharge.  Strict return precautions given.  The patient is on the cardiac monitor to evaluate for evidence of arrhythmia and/or significant heart rate changes.   Clinical Course as of 12/15/23 1816  Tue Dec 15, 2023  1642 DG Chest 2 View No active cardiopulmonary disease.  [TT]  1748 Independent review of labs, no leukocytosis, BNP and troponin are not elevated, electrolytes are severely deranged. [TT]  1812 Resp panel by RT-PCR (RSV, Flu A&B, Covid) Anterior Nasal Swab Negative [TT]    Clinical Course User Index [TT] Waymond Lorelle Cummins, MD     FINAL CLINICAL IMPRESSION(S) / ED DIAGNOSES   Final diagnoses:  Mold exposure  Shortness of breath  Cough, unspecified type  Diarrhea, unspecified type  Abdominal pain, unspecified abdominal location     Rx / DC Orders   ED Discharge Orders     None        Note:  This document was prepared using Dragon voice recognition software and may include unintentional dictation errors.     Waymond Lorelle Cummins, MD 12/15/23 [phone]
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
## 2023-12-15 NOTE — Discharge Instructions (Addendum)
 Your symptoms could be related to the mold that is in your house, please make sure to get it mitigated.

## 2024-01-14 ENCOUNTER — Encounter: Admitting: Nurse Practitioner

## 2024-01-18 ENCOUNTER — Encounter: Payer: Self-pay | Admitting: Pharmacist

## 2024-01-18 NOTE — Progress Notes (Signed)
 Pharmacy Quality Measure Review  This patient is appearing on a report for being at risk of failing the Kidney Health Evaluation for Patients with Diabetes measure this calendar year.   Last documented UACR: 2017 Lab Results  Component Value Date   MICRALBCREAT 5 11/19/2015   Upcoming visit with PCP.  Msg sent to consider UACR at next visit.  Encounter note placed for UACR consideration.   Future Appointments  Date Time Provider Department Center  02/29/2024  1:00 PM Bair, Kalpana, MD [id number]

## 2024-01-19 ENCOUNTER — Encounter: Admitting: Physical Medicine & Rehabilitation

## 2024-01-29 ENCOUNTER — Encounter: Attending: Physical Medicine & Rehabilitation | Admitting: Physical Medicine & Rehabilitation

## 2024-01-29 ENCOUNTER — Encounter: Payer: Self-pay | Admitting: Physical Medicine & Rehabilitation

## 2024-01-29 VITALS — BP 126/89 | HR 63 | Ht 70.0 in | Wt 137.0 lb

## 2024-01-29 DIAGNOSIS — M5412 Radiculopathy, cervical region: Secondary | ICD-10-CM | POA: Insufficient documentation

## 2024-01-29 DIAGNOSIS — M533 Sacrococcygeal disorders, not elsewhere classified: Secondary | ICD-10-CM | POA: Diagnosis not present

## 2024-01-29 DIAGNOSIS — G8929 Other chronic pain: Secondary | ICD-10-CM | POA: Diagnosis not present

## 2024-01-29 NOTE — Patient Instructions (Signed)
  VISIT SUMMARY: You visited us  today due to pain in your right shoulder and arm, which is related to your cervical disc bulge and foraminal stenosis. You also reported chronic hip pain, which is likely related to a sacroiliac joint disorder.  YOUR PLAN: RIGHT CERVICAL RADICULOPATHY: You have chronic pain that radiates from your neck to your right shoulder and arm, stopping just below the elbow. This is due to a disc bulge at C5-C6 causing nerve impingement. -You are referred to [facility] for evaluation and potential cervical epidural injections.  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 This pain is exacerbated by weather changes. -You are referred to [facility] for evaluation and potential treatment of your sacroiliac joint disorder.                      Contains text generated by Abridge.                                 Contains text generated by Abridge.
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
## 2024-02-01 NOTE — Telephone Encounter (Signed)
 Noted. Discussed patient message with office manager. Please defer to her documentation for further communication with the patient.   Luke Shade, MD

## 2024-02-02 ENCOUNTER — Telehealth: Admitting: Physician Assistant

## 2024-02-02 DIAGNOSIS — R52 Pain, unspecified: Secondary | ICD-10-CM

## 2024-02-02 NOTE — Progress Notes (Signed)
  Because of needing an injection for pain, I feel your condition warrants further evaluation and I recommend that you be seen in a face-to-face visit.   NOTE: There will be NO CHARGE for this E-Visit   If you are having a true medical emergency, please call 911.     For an urgent face to face visit, [facility] has multiple urgent care centers for your convenience.  Click the link below for the full list of locations and hours, walk-in wait times, appointment scheduling options and driving directions:  Urgent Care - [place], [place], [place], [place], [place], [place]  [place]     Your MyChart E-visit questionnaire answers were reviewed by a board certified advanced clinical practitioner to complete your personal care plan based on your specific symptoms.    Thank you for using e-Visits.

## 2024-02-03 ENCOUNTER — Telehealth: Payer: Self-pay

## 2024-02-03 NOTE — Telephone Encounter (Signed)
 We find it necessary to inform you that [facility] and all [facility] Primary Care Practices/Providers will no longer be able to provide medical care to you.  We believe that our patient/provider relationship has been compromised and thus would request that you seek care elsewhere.  02/02/24

## 2024-02-08 ENCOUNTER — Ambulatory Visit

## 2024-02-08 ENCOUNTER — Encounter: Payer: Self-pay | Admitting: Family

## 2024-02-08 ENCOUNTER — Ambulatory Visit: Admitting: Family

## 2024-02-08 VITALS — BP 130/86 | HR 87 | Temp 98.2°F | Ht 71.0 in | Wt 140.5 lb

## 2024-02-08 DIAGNOSIS — L309 Dermatitis, unspecified: Secondary | ICD-10-CM

## 2024-02-08 DIAGNOSIS — K5900 Constipation, unspecified: Secondary | ICD-10-CM | POA: Diagnosis not present

## 2024-02-08 DIAGNOSIS — M5137 Other intervertebral disc degeneration, lumbosacral region with discogenic back pain only: Secondary | ICD-10-CM | POA: Diagnosis not present

## 2024-02-08 DIAGNOSIS — M5412 Radiculopathy, cervical region: Secondary | ICD-10-CM | POA: Diagnosis not present

## 2024-02-08 DIAGNOSIS — G8929 Other chronic pain: Secondary | ICD-10-CM

## 2024-02-08 MED ORDER — HYDROCORTISONE 2.5 % EX CREA: 1.0000 | TOPICAL_CREAM | Freq: Two times a day (BID) | CUTANEOUS | 3 refills | Status: AC

## 2024-02-08 MED ORDER — CLOTRIMAZOLE 1 % EX CREA: 1.0000 | TOPICAL_CREAM | Freq: Two times a day (BID) | CUTANEOUS | 1 refills | Status: AC

## 2024-02-08 MED ORDER — PREDNISONE 10 MG PO TABS: ORAL_TABLET | ORAL | 0 refills | Status: AC

## 2024-02-08 MED ORDER — CYCLOBENZAPRINE HCL 10 MG PO TABS: 10.0000 mg | ORAL_TABLET | Freq: Every evening | ORAL | 0 refills | Status: AC | PRN

## 2024-02-08 MED ORDER — TRIAMCINOLONE ACETONIDE 0.1 % EX CREA: 1.0000 | TOPICAL_CREAM | Freq: Two times a day (BID) | CUTANEOUS | 3 refills | Status: AC

## 2024-02-08 NOTE — Progress Notes (Unsigned)
 Assessment & Plan:  Cervical radiculitis -     DG HIPS BILAT W OR W/O PELVIS 3-4 VIEWS; Future -     DG Lumbar Spine Complete; Future -     DG Cervical Spine Complete; Future -     Ambulatory referral to Neurosurgery -     Ambulatory referral to Pain Clinic  Eczema, unspecified type -     Triamcinolone  Acetonide; Apply 1 Application topically 2 (two) times daily. Prn eczema not face, underarms/groin  Dispense: 454 g; Refill: 3 -     Hydrocortisone ; Apply 1 Application topically 2 (two) times daily. APPLY TOPICALLY TO FACE  TWICE DAILY Strength: 2.5 %  Dispense: 90 g; Refill: 3 -     Clotrimazole ; Apply 1 Application topically 2 (two) times daily.  Dispense: 30 g; Refill: 1  Dermatitis -     Hydrocortisone ; Apply 1 Application topically 2 (two) times daily. APPLY TOPICALLY TO FACE  TWICE DAILY Strength: 2.5 %  Dispense: 90 g; Refill: 3  Degeneration of intervertebral disc of lumbosacral region with discogenic back pain -     DG HIPS BILAT W OR W/O PELVIS 3-4 VIEWS; Future -     DG Lumbar Spine Complete; Future -     DG Cervical Spine Complete; Future -     Cyclobenzaprine  HCl; Take 1 tablet (10 mg total) by mouth at bedtime as needed for muscle spasms.  Dispense: 14 tablet; Refill: 0 -     predniSONE ; Take 4 tablets ( total 40 mg) by mouth for 2 days; take 3 tablets ( total 30 mg) by mouth for 2 days; take 2 tablets ( total 20 mg) by mouth for 1 day; take 1 tablet ( total 10 mg) by mouth for 1 day.  Dispense: 17 tablet; Refill: 0 -     Ambulatory referral to Neurosurgery -     Ambulatory referral to Pain Clinic  Chronic bilateral low back pain without sciatica Assessment & Plan:  Pain persists despite spinal cord stimulator and current medication regimen. Previous injections provided temporary relief. Discussed prednisone  taper and muscle relaxants for pain management. Ordered x-rays of hips, lumbar spine, and neck. Prescribed prednisone  taper, Flexeril , and Tylenol  Arthritis 650 mg  for scheduled use. Referred to Dr. Marcelino for pain management and potential injections. Referred to Dr. Bluford for re evaluation of spinal cord stimulator.   Constipation, unspecified constipation type Assessment & Plan: Discussed Miralax  and dietary modifications. Recommended Miralax  for chronic constipation. Advised on dietary modifications including increased water  intake and consumption of fruits and vegetables.      Return precautions given.   Risks, benefits, and alternatives of the medications and treatment plan prescribed today were discussed, and patient expressed understanding.   Education regarding symptom management and diagnosis given to patient on AVS either electronically or printed.  No follow-ups on file.  Rollene Northern, FNP  Subjective:    Patient ID: Samuel Burnett, male    DOB: 06/06/1960, 63 y.o.   MRN: [id number]  CC: Samuel Burnett is a 63 y.o. male who presents today for an acute visit.    HPI: HPI Discussed the use of AI scribe software for clinical note transcription with the patient, who gave verbal consent to proceed.  History of Present Illness   Samuel Burnett is a 63 year old male with chronic pain who presents for management of his condition.  He experiences chronic pain primarily in his hips and lower back, exacerbated by cold weather due to  titanium and metal screws in both hips. He has a history of bilateral hip replacements and a spinal cord stimulator placed three years ago, which initially provided relief but is now causing irritation despite a recent battery replacement. Pain in the lower back feels like a band across the area where he sits. Sitting for long periods and lying down without a pillow between his legs aggravates the pain. He takes Tylenol  500 mg as needed, although it provides limited relief. He also uses ibuprofen occasionally but finds it irritates his stomach.Prednisone  will temporarily help with pain.  He reports a history  of scoliosis. He has not had injections in his hips since before his hip replacement surgeries.  He takes Cymbalta  for depression and anxiety, which helps with mood stabilization but not with pain. He has tried gabapentin  in the past but did not tolerate it well. He has not tried Lyrica.  He also complains of chronic constipation.  He has been taking simethicone  without relief. No blood in stool.    History of cad, hypertension, diabetes,emphysema, HIV infection, RLS  H/o spinal cord stimulator; placed by Dr Bluford  H/o right and left arthroplasty Former smoker  01/29/24 Dr Carilyn for neck pain, hip pain Referral made to [facility] Follow up physical medicine prn.   11/17/23 UC fpr back pain given IM ketorolac  , prednisone  taper Complicated with UTI 12/15/23  GFR 60  A1c 6.5  Colonoscopy 06/2021       07/23/2022  CT cervical Grade 1 anterolisthesis of C3 on C4 and C4 on C5.  Skull base and vertebrae: Multilevel at least moderate degenerative changes of the spine spine with mild to moderate right C4-C5 and C5-C6 osseous neural foraminal stenosis. No severe osseous neural foraminal or central canal stenosis. No acute fracture. No aggressive appearing focal osseous lesion or focal pathologic process.  CT thoracic Multilevel mild degenerative changes spine. No acute fracture or focal pathologic process.  CT lumbar  Disc bulge at the L2-L3, L3-L4, and L4-L5 levels. Pseudoarthrosis of the left L5-S1 level. Multilevel mild-to-moderate degenerative changes of the spine.No acute displaced fracture or traumatic listhesis of the thoracolumbar spine.    Allergies: Fentanyl, Penicillin g, Shellfish allergy, Tomato, Bee pollen, Grass pollen(k-o-r-t-swt vern), Pollen extract, Tylenol  [acetaminophen ], Aspirin, and Dust mite extract Current Outpatient Medications on File Prior to Visit  Medication Sig Dispense Refill   albuterol  (PROAIR  HFA) 108 (90 Base)  MCG/ACT inhaler Inhale 1-2 puffs into the lungs every 6 (six) hours as needed for wheezing or shortness of breath. 54 each 3   amLODipine  (NORVASC ) 2.5 MG tablet Take 0.5 tablets (1.25 mg total) by mouth daily.     bictegravir-emtricitabine-tenofovir AF (BIKTARVY) 50-200-25 MG TABS tablet Take 1 tablet by mouth daily.     Blood Pressure Monitor KIT 1 kit by Does not apply route as directed. 1 each 0   Cholecalciferol (VITAMIN D3 SUPER STRENGTH) 50 MCG (2000 UT) TABS Take 2 tablets (4,000 Units total) by mouth daily. (Patient taking differently: Take 3 tablets by mouth every other day.) 180 tablet 3   DULoxetine  (CYMBALTA ) 60 MG capsule Take 1 capsule (60 mg total) by mouth daily. 90 capsule 3   ezetimibe  (ZETIA ) 10 MG tablet Take 1 tablet (10 mg total) by mouth daily. 90 tablet 3   INSULIN SYRINGE 1CC/29G 29G X 1/2 1 ML MISC 1 each by Other route daily.     mupirocin  ointment (BACTROBAN ) 2 % Apply 1 Application topically as needed.  ondansetron  (ZOFRAN -ODT) 4 MG disintegrating tablet Take 1 tablet (4 mg total) by mouth every 8 (eight) hours as needed. 20 tablet 0   pravastatin  (PRAVACHOL ) 40 MG tablet TAKE 1 TABLET BY MOUTH  DAILY AT NIGHT 90 tablet 3   rOPINIRole  (REQUIP ) 1 MG tablet Take 2 tablets (2 mg total) by mouth at bedtime. 180 tablet 3   tamsulosin  (FLOMAX ) 0.4 MG CAPS capsule Take 1 capsule (0.4 mg total) by mouth daily. 90 capsule 3   traZODone  (DESYREL ) 50 MG tablet Take 1 tablet (50 mg total) by mouth at bedtime as needed. for sleep 90 tablet 3   No current facility-administered medications on file prior to visit.    Review of Systems  Constitutional:  Negative for chills and fever.  Respiratory:  Negative for cough.   Cardiovascular:  Negative for chest pain and palpitations.  Gastrointestinal:  Positive for constipation. Negative for blood in stool, nausea and vomiting.  Musculoskeletal:  Positive for back pain.      Objective:    BP 130/86   Pulse 87   Temp 98.2 F  (36.8 C) (Oral)   Ht 5' 11 (1.803 m)   Wt 140 lb 8 oz (63.7 kg)   SpO2 96%   BMI 19.60 kg/m   BP Readings from Last 3 Encounters:  02/08/24 130/86  01/29/24 126/89  12/15/23 108/78   Wt Readings from Last 3 Encounters:  02/08/24 140 lb 8 oz (63.7 kg)  01/29/24 137 lb (62.1 kg)  12/15/23 132 lb 3.2 oz (60 kg)    Physical Exam Vitals reviewed.  Constitutional:      Appearance: Normal appearance. He is well-developed.  Cardiovascular:     Rate and Rhythm: Regular rhythm.     Heart sounds: Normal heart sounds.  Pulmonary:     Effort: Pulmonary effort is normal. No respiratory distress.     Breath sounds: Normal breath sounds. No wheezing or rales.  Abdominal:     General: Bowel sounds are normal. There is no distension.     Palpations: Abdomen is soft. Abdomen is not rigid. There is no fluid wave or mass.     Tenderness: There is no abdominal tenderness. There is no guarding or rebound. Negative signs include Murphy's sign and McBurney's sign.  Musculoskeletal:     Cervical back: No torticollis. No pain with movement, spinous process tenderness or muscular tenderness.     Lumbar back: No swelling, spasms or tenderness. Normal range of motion. Negative right straight leg raise test and negative left straight leg raise test.     Comments: Full range of motion with flexion, extension, lateral side bends. No pain, numbness, tingling elicited with single leg raise bilaterally. No rash.  Skin:    General: Skin is warm and dry.  Neurological:     Mental Status: He is alert.  Psychiatric:        Speech: Speech normal.        Behavior: Behavior normal.

## 2024-02-08 NOTE — Patient Instructions (Addendum)
 Referral to neurosurgery, Dr Bluford and also to Dr. Marcelino , pain management  As discussed, let's start by scheduling Tylenol  Arthritis which is a 650mg  tablet .   You may take 1-2 tablets every 8 hours ( scheduled) with maximum of 6 tablets per day. Most adults can safely take 4 pills total per day of Tylenol  Arthritis 650mg  tablet. Do not exceed 6 tablets a day of Tylenol  Arthritis 650mg  tablet   For example , you could take two tablets in the morning ( 8am) and then two tablets again at 4pm.   Maximum daily dose of acetaminophen  4 g per day from all sources.  If you are taking another medication which includes acetaminophen  (Tylenol ) which may be in cough and cold preparations or pain medication such as Percocet, you will need to factor that into your total daily dose to be safe.  Please let me know if any questions  A great article regarding how to safely take and dose tylenol  found below.  Title : 'Acetaminophen  safety: Be cautious but not afraid'  https://www.health.[employer].edu/pain/acetaminophen -safety-be-cautious-but-not-afraid    Constipation plan  start taking MiraLAX , half a dose every day or every other day and change the dose as needed after 3 to 5 days with goal of 1-2 soft bowel movements every day or every other day. MiraLAX  is an osmotic laxative. That means it draws water  into the colon, which softens the stool and may naturally stimulate the colon to contract. These actions help ease bowel movements. For example, you  may find that using the medication every other day or three times a week is a good bowel regimen for you. Or perhaps, twice weekly.    It is MOST important to drink LOTS of water  and follow a HIGH fiber diet to keep foods moving through the gut. You may add Metamucil to a beverage that you drink.  Information on prevention of constipation as well as acute treatment for constipation as included below.  If there is no improvement in your symptoms, or if there is  any worsening of symptoms, or if you have any additional concerns, please return to this clinic for re-evaluation; or, if we are closed, consider going to the Emergency Room for evaluation.    Constipation Prevention What is Constipation? Constipation is hard, dry bowel movements or the inability to have a bowel movement.  You can also feel like you need to have a bowel movement but not be able to.  It can also be painful when you strain to have a bowel movement.  Taking narcotic pain medicine after surgery can make you constipated, even if you have never had a problem with constipation. What Do I Need To Do? The best thing to do for constipation is to keep it from happening.  This can be done by: Adding laxatives to your daily routine, when taking prescription pain medicines after surgery. Add 17 gm Miralax  daily or 100 mg Colace once or twice daily. (Miralax  is mixed in water . Colace is a pill). They soften your bowel movements to make them easier to pass and hurt less. Drink plenty of water  to help flush your bowels.  (Eight, 8 ounce glasses daily) Eat foods high in fiber such as whole grains, vegetables, cereals, fruits, and prune juice (5-7 servings a day or 25 grams).  If you do not know how much fiber a food has in it you can look on the label under "dietary fiber."  If you have trouble getting enough fiber in your  diet you may want to consider a fiber supplement such as Metamucil or Citrucel.  Also, be aware that eating fiber without drinking enough water  can make constipation worse. If you do become constipated some medications that may help are: Bisacodyl (Dulcolax) is available in tablet form or a suppository. Glycerin suppositories are also a good choice if you need a fast acting medication. Everybody is different and may have different results.  Talk to your pharmacist or health care provider about your specific problems. They can help you choose the best product for you.  Why Is It  Important for Me To Do This? Being constipated is not something you have to live with.  There are many things you can do to help.   Feeling bad can interfere with your recovery after surgery.  If constipation goes on for too long it can become a very serious medical problem. You may need to visit your doctor or go to the hospital.  That is why it is very important to drink lots of water , eat enough fiber, and keep it from happening.  Ask Questions We want to answer all of your questions and concerns.  That's why we encourage you to use a program called Ask Me 3T, created by the [employer].  By using Ask Me 3T you are encouraged to ask 3 simple (yet, potentially life saving questions) whenever you are talking with your physician, nurse or pharmacist: What is my main problem? What do I need to do? Why is it important for me to do this? By understanding the answer to these three questions and any other questions you may have, you have the knowledge necessary to manage your health. Please feel very comfortable asking any questions. Healthcare is complicated, so if you hear an answer you do not understand, please ask your health care team to explain again.   Sources: [employer] 12-27-09 N

## 2024-02-11 ENCOUNTER — Telehealth: Payer: Self-pay

## 2024-02-11 DIAGNOSIS — R0981 Nasal congestion: Secondary | ICD-10-CM

## 2024-02-11 MED ORDER — FLUTICASONE PROPIONATE 50 MCG/ACT NA SUSP: NASAL | 0 refills | Status: AC

## 2024-02-11 NOTE — Addendum Note (Signed)
 Addended by: Jazsmin Couse on: 02/11/2024 08:35 AM   Modules accepted: Orders

## 2024-02-11 NOTE — Telephone Encounter (Signed)
 Noted

## 2024-02-11 NOTE — Telephone Encounter (Signed)
 Patient [facility] is requesting refill on Flonase  nasal spray, but is not active on current medication list. Please advise?

## 2024-02-11 NOTE — Telephone Encounter (Signed)
 I reviewed his medication history. He was prescribed Flonase  in the past, last refill was sent by previous PCP in 2022. I will send one time refill as following:    1. Nasal congestion - fluticasone  (FLONASE ) 50 MCG/ACT nasal spray; SPRAY 2 SPRAYS INTO EACH NOSTRIL EVERY DAY AS NEEDED FOR ALLERGIES OR RHINITIS. USE NASAL SALINE 1ST  Dispense: 48 mL; Refill: 0  Luke Shade, MD

## 2024-02-12 DIAGNOSIS — K5900 Constipation, unspecified: Secondary | ICD-10-CM | POA: Insufficient documentation

## 2024-02-12 NOTE — Assessment & Plan Note (Signed)
 Discussed Miralax  and dietary modifications. Recommended Miralax  for chronic constipation. Advised on dietary modifications including increased water  intake and consumption of fruits and vegetables.

## 2024-02-12 NOTE — Assessment & Plan Note (Signed)
  Pain persists despite spinal cord stimulator and current medication regimen. Previous injections provided temporary relief. Discussed prednisone  taper and muscle relaxants for pain management. Ordered x-rays of hips, lumbar spine, and neck. Prescribed prednisone  taper, Flexeril , and Tylenol  Arthritis 650 mg for scheduled use. Referred to Dr. Marcelino for pain management and potential injections. Referred to Dr. Bluford for re evaluation of spinal cord stimulator.

## 2024-02-16 ENCOUNTER — Ambulatory Visit: Payer: Self-pay | Admitting: Family

## 2024-02-18 ENCOUNTER — Telehealth: Payer: Self-pay

## 2024-02-18 NOTE — Telephone Encounter (Signed)
 Copied from CRM #[id number]. Topic: Referral - Question >> Feb 18, 2024  2:57 PM Tysheama G wrote: Reason for CRM: Patient is calling regarding referral that was supposed to be placed. Advised him on the msg that was left on 12/9 and advised it does take 7-10 business days. Patient also hasn't been contacted yet as well. Advised NP Arnett stated if he hasn't heard from anyone by next week to let her know.

## 2024-02-22 NOTE — Progress Notes (Signed)
 KOHNER ORLICK                                          MRN: [id number]   02/22/2024   The [facility] Quality Team Specialist reviewed this patient medical record for the purposes of chart review for care gap closure. The following were reviewed: chart review for care gap closure-diabetic eye exam and kidney health evaluation for diabetes:eGFR  and uACR.    [facility] Quality Team

## 2024-02-22 NOTE — Progress Notes (Signed)
 Samuel Burnett                                          MRN: [id number]   02/22/2024   The [facility] Quality Team Specialist reviewed this patient medical record for the purposes of chart review for care gap closure. The following were reviewed: abstraction for care gap closure-glycemic status assessment.    [facility] Quality Team

## 2024-02-23 NOTE — Telephone Encounter (Signed)
 Last read by Ruffus LULLA Nose at 2:59PM on 02/23/2024.

## 2024-02-29 ENCOUNTER — Ambulatory Visit: Admitting: Internal Medicine

## 2024-02-29 ENCOUNTER — Ambulatory Visit

## 2024-03-29 ENCOUNTER — Encounter: Payer: Self-pay | Admitting: Student in an Organized Health Care Education/Training Program

## 2024-03-29 ENCOUNTER — Other Ambulatory Visit: Payer: Self-pay | Admitting: Student in an Organized Health Care Education/Training Program

## 2024-03-29 ENCOUNTER — Ambulatory Visit
Admission: RE | Admit: 2024-03-29 | Discharge: 2024-03-29 | Disposition: A | Discharge status: Home / Self Care | Source: Ambulatory Visit | Attending: Student in an Organized Health Care Education/Training Program | Admitting: Student in an Organized Health Care Education/Training Program

## 2024-03-29 ENCOUNTER — Ambulatory Visit (HOSPITAL_BASED_OUTPATIENT_CLINIC_OR_DEPARTMENT_OTHER): Admitting: Student in an Organized Health Care Education/Training Program

## 2024-03-29 VITALS — BP 127/97 | HR 72 | Temp 97.2°F | Resp 16 | Ht 71.0 in | Wt 140.0 lb

## 2024-03-29 DIAGNOSIS — M5412 Radiculopathy, cervical region: Secondary | ICD-10-CM | POA: Diagnosis not present

## 2024-03-29 DIAGNOSIS — M4125 Other idiopathic scoliosis, thoracolumbar region: Secondary | ICD-10-CM

## 2024-03-29 DIAGNOSIS — G894 Chronic pain syndrome: Secondary | ICD-10-CM

## 2024-03-29 DIAGNOSIS — M79601 Pain in right arm: Secondary | ICD-10-CM | POA: Insufficient documentation

## 2024-03-29 NOTE — Progress Notes (Signed)
 Safety precautions to be maintained throughout the outpatient stay will include: orient to surroundings, keep bed in low position, maintain call bell within reach at all times, provide assistance with transfer out of bed and ambulation.

## 2024-03-29 NOTE — Patient Instructions (Signed)
GENERAL RISKS AND COMPLICATIONS ° °What are the risk, side effects and possible complications? °Generally speaking, most procedures are safe.  However, with any procedure there are risks, side effects, and the possibility of complications.  The risks and complications are dependent upon the sites that are lesioned, or the type of nerve block to be performed.  The closer the procedure is to the spine, the more serious the risks are.  Great care is taken when placing the radio frequency needles, block needles or lesioning probes, but sometimes complications can occur. °Infection: Any time there is an injection through the skin, there is a risk of infection.  This is why sterile conditions are used for these blocks.  There are four possible types of infection. °Localized skin infection. °Central Nervous System Infection-This can be in the form of Meningitis, which can be deadly. °Epidural Infections-This can be in the form of an epidural abscess, which can cause pressure inside of the spine, causing compression of the spinal cord with subsequent paralysis. This would require an emergency surgery to decompress, and there are no guarantees that the patient would recover from the paralysis. °Discitis-This is an infection of the intervertebral discs.  It occurs in about 1% of discography procedures.  It is difficult to treat and it may lead to surgery. ° °      2. Pain: the needles have to go through skin and soft tissues, will cause soreness. °      3. Damage to internal structures:  The nerves to be lesioned may be near blood vessels or   ° other nerves which can be potentially damaged. °      4. Bleeding: Bleeding is more common if the patient is taking blood thinners such as  aspirin, Coumadin, Ticiid, Plavix, etc., or if he/she have some genetic predisposition  such as hemophilia. Bleeding into the spinal canal can cause compression of the spinal  cord with subsequent paralysis.  This would require an emergency  surgery to  decompress and there are no guarantees that the patient would recover from the  paralysis. °      5. Pneumothorax:  Puncturing of a lung is a possibility, every time a needle is introduced in  the area of the chest or upper back.  Pneumothorax refers to free air around the  collapsed lung(s), inside of the thoracic cavity (chest cavity).  Another two possible  complications related to a similar event would include: Hemothorax and Chylothorax.   These are variations of the Pneumothorax, where instead of air around the collapsed  lung(s), you may have blood or chyle, respectively. °      6. Spinal headaches: They may occur with any procedures in the area of the spine. °      7. Persistent CSF (Cerebro-Spinal Fluid) leakage: This is a rare problem, but may occur  with prolonged intrathecal or epidural catheters either due to the formation of a fistulous  track or a dural tear. °      8. Nerve damage: By working so close to the spinal cord, there is always a possibility of  nerve damage, which could be as serious as a permanent spinal cord injury with  paralysis. °      9. Death:  Although rare, severe deadly allergic reactions known as "Anaphylactic  reaction" can occur to any of the medications used. °     10. Worsening of the symptoms:  We can always make thing worse. ° °What are the chances   of something like this happening? Chances of any of this occuring are extremely low.  By statistics, you have more of a chance of getting killed in a motor vehicle accident: while driving to the hospital than any of the above occurring .  Nevertheless, you should be aware that they are possibilities.  In general, it is similar to taking a shower.  Everybody knows that you can slip, hit your head and get killed.  Does that mean that you should not shower again?  Nevertheless always keep in mind that statistics do not mean anything if you happen to be on the wrong side of them.  Even if a procedure has a 1 (one) in a  1,000,000 (million) chance of going wrong, it you happen to be that one..Also, keep in mind that by statistics, you have more of a chance of having something go wrong when taking medications.  Who should not have this procedure? If you are on a blood thinning medication (e.g. Coumadin, Plavix, see list of "Blood Thinners"), or if you have an active infection going on, you should not have the procedure.  If you are taking any blood thinners, please inform your physician.  How should I prepare for this procedure? Do not eat or drink anything at least six hours prior to the procedure. Bring a driver with you .  It cannot be a taxi. Come accompanied by an adult that can drive you back, and that is strong enough to help you if your legs get weak or numb from the local anesthetic. Take all of your medicines the morning of the procedure with just enough water to swallow them. If you have diabetes, make sure that you are scheduled to have your procedure done first thing in the morning, whenever possible. If you have diabetes, take only half of your insulin dose and notify our nurse that you have done so as soon as you arrive at the clinic. If you are diabetic, but only take blood sugar pills (oral hypoglycemic), then do not take them on the morning of your procedure.  You may take them after you have had the procedure. Do not take aspirin or any aspirin-containing medications, at least eleven (11) days prior to the procedure.  They may prolong bleeding. Wear loose fitting clothing that may be easy to take off and that you would not mind if it got stained with Betadine or blood. Do not wear any jewelry or perfume Remove any nail coloring.  It will interfere with some of our monitoring equipment.  NOTE: Remember that this is not meant to be interpreted as a complete list of all possible complications.  Unforeseen problems may occur.  BLOOD THINNERS The following drugs contain aspirin or other products,  which can cause increased bleeding during surgery and should not be taken for 2 weeks prior to and 1 week after surgery.  If you should need take something for relief of minor pain, you may take acetaminophen which is found in Tylenol,m Datril, Anacin-3 and Panadol. It is not blood thinner. The products listed below are.  Do not take any of the products listed below in addition to any listed on your instruction sheet.  A.P.C or A.P.C with Codeine Codeine Phosphate Capsules #3 Ibuprofen Ridaura  ABC compound Congesprin Imuran rimadil  Advil Cope Indocin Robaxisal  Alka-Seltzer Effervescent Pain Reliever and Antacid Coricidin or Coricidin-D  Indomethacin Rufen  Alka-Seltzer plus Cold Medicine Cosprin Ketoprofen S-A-C Tablets  Anacin Analgesic Tablets or Capsules Coumadin   Korlgesic Salflex  Anacin Extra Strength Analgesic tablets or capsules CP-2 Tablets Lanoril Salicylate  Anaprox Cuprimine Capsules Levenox Salocol  Anexsia-D Dalteparin Magan Salsalate  Anodynos Darvon compound Magnesium Salicylate Sine-off  Ansaid Dasin Capsules Magsal Sodium Salicylate  Anturane Depen Capsules Marnal Soma  APF Arthritis pain formula Dewitt's Pills Measurin Stanback  Argesic Dia-Gesic Meclofenamic Sulfinpyrazone  Arthritis Bayer Timed Release Aspirin Diclofenac Meclomen Sulindac  Arthritis pain formula Anacin Dicumarol Medipren Supac  Analgesic (Safety coated) Arthralgen Diffunasal Mefanamic Suprofen  Arthritis Strength Bufferin Dihydrocodeine Mepro Compound Suprol  Arthropan liquid Dopirydamole Methcarbomol with Aspirin Synalgos  ASA tablets/Enseals Disalcid Micrainin Tagament  Ascriptin Doan's Midol Talwin  Ascriptin A/D Dolene Mobidin Tanderil  Ascriptin Extra Strength Dolobid Moblgesic Ticlid  Ascriptin with Codeine Doloprin or Doloprin with Codeine Momentum Tolectin  Asperbuf Duoprin Mono-gesic Trendar  Aspergum Duradyne Motrin or Motrin IB Triminicin  Aspirin plain, buffered or enteric coated  Durasal Myochrisine Trigesic  Aspirin Suppositories Easprin Nalfon Trillsate  Aspirin with Codeine Ecotrin Regular or Extra Strength Naprosyn Uracel  Atromid-S Efficin Naproxen Ursinus  Auranofin Capsules Elmiron Neocylate Vanquish  Axotal Emagrin Norgesic Verin  Azathioprine Empirin or Empirin with Codeine Normiflo Vitamin E  Azolid Emprazil Nuprin Voltaren  Bayer Aspirin plain, buffered or children's or timed BC Tablets or powders Encaprin Orgaran Warfarin Sodium  Buff-a-Comp Enoxaparin Orudis Zorpin  Buff-a-Comp with Codeine Equegesic Os-Cal-Gesic   Buffaprin Excedrin plain, buffered or Extra Strength Oxalid   Bufferin Arthritis Strength Feldene Oxphenbutazone   Bufferin plain or Extra Strength Feldene Capsules Oxycodone with Aspirin   Bufferin with Codeine Fenoprofen Fenoprofen Pabalate or Pabalate-SF   Buffets II Flogesic Panagesic   Buffinol plain or Extra Strength Florinal or Florinal with Codeine Panwarfarin   Buf-Tabs Flurbiprofen Penicillamine   Butalbital Compound Four-way cold tablets Penicillin   Butazolidin Fragmin Pepto-Bismol   Carbenicillin Geminisyn Percodan   Carna Arthritis Reliever Geopen Persantine   Carprofen Gold's salt Persistin   Chloramphenicol Goody's Phenylbutazone   Chloromycetin Haltrain Piroxlcam   Clmetidine heparin Plaquenil   Cllnoril Hyco-pap Ponstel   Clofibrate Hydroxy chloroquine Propoxyphen         Before stopping any of these medications, be sure to consult the physician who ordered them.  Some, such as Coumadin (Warfarin) are ordered to prevent or treat serious conditions such as "deep thrombosis", "pumonary embolisms", and other heart problems.  The amount of time that you may need off of the medication may also vary with the medication and the reason for which you were taking it.  If you are taking any of these medications, please make sure you notify your pain physician before you undergo any procedures.         Moderate Conscious  Sedation, Adult Sedation is the use of medicines to help you relax and not feel pain. Moderate conscious sedation is a type of sedation that makes you less alert than normal. You are still able to respond to instructions, touch, or both. This type of sedation is used during short medical and dental procedures. It is milder than deep sedation, which is a type of sedation you cannot be easily woken up from. It is also milder than general anesthesia, which is the use of medicines to make you fall asleep. Moderate conscious sedation lets you return to your normal activities sooner. Tell a health care provider about: Any allergies you have. All medicines you are taking, including vitamins, herbs, steroids, eye drops, creams, and over-the-counter medicines. Any problems you or family members have had with anesthesia.  Any bleeding problems you have. Any surgeries you have had. Any medical conditions you have. Whether you are pregnant or may be pregnant. Any recent alcohol, tobacco, or drug use. What are the risks? Your health care provider will talk with you about risks. These may include: Oversedation. This is when you get too much medicine. Nausea or vomiting. Allergic reaction to medicines. Trouble breathing. If this happens, a breathing tube may be used. It will be removed when you can breathe better on your own. Heart trouble. Lung trouble. Emergence delirium. This is when you feel confused while the sedation wears off. This gets better with time. What happens before the procedure? When to stop eating and drinking Follow instructions from your health care provider about what you may eat and drink. These may include: 8 hours before your procedure Stop eating most foods. Do not eat meat, fried foods, or fatty foods. Eat only light foods, such as toast or crackers. All liquids are okay except energy drinks and alcohol. 6 hours before your procedure Stop eating. Drink only clear liquids, such  as water, clear fruit juice, black coffee, plain tea, and sports drinks. Do not drink energy drinks or alcohol. 2 hours before your procedure Stop drinking all liquids. You may be allowed to take medicines with small sips of water. If you do not follow your health care provider's instructions, your procedure may be delayed or canceled. Medicines Ask your health care provider about: Changing or stopping your regular medicines. These include any diabetes medicines or blood thinners you take. Taking medicines such as aspirin and ibuprofen. These medicines can thin your blood. Do not take them unless your health care provider tells you to. Taking over-the-counter medicines, vitamins, herbs, and supplements. Tests and exams You may have an exam or testing. You may have a blood or urine sample taken. General instructions Do not use any products that contain nicotine or tobacco for at least 4 weeks before the procedure. These products include cigarettes, chewing tobacco, and vaping devices, such as e-cigarettes. If you need help quitting, ask your health care provider. If you will be going home right after the procedure, plan to have a responsible adult: Take you home from the hospital or clinic. You will not be allowed to drive. Care for you for the time you are told. What happens during the procedure?  You will be given the sedative. It may be given: As a pill you can take by mouth. It can also be put into the rectum. As a spray through the nose. As an injection into muscle. As an injection into a vein through an IV. You may be given oxygen as needed. Your blood pressure, heart rate, breathing rate, and blood oxygen level will be monitored during the procedure. The medical or dental procedure will be done. The procedure may vary among health care providers and hospitals. What happens after the procedure? Your blood pressure, heart rate, breathing rate, and blood oxygen level will be  monitored until you leave the hospital or clinic. You will get fluids through an IV as needed. Do not drive or operate machinery until your health care provider says that it is safe. This information is not intended to replace advice given to you by your health care provider. Make sure you discuss any questions you have with your health care provider. Document Revised: 09/09/2021 Document Reviewed: 09/09/2021 [employer] Patient Education  2024 [employer] Epidural Steroid Injection Patient Information  Description: The epidural space surrounds the nerves as  they exit the spinal cord.  In some patients, the nerves can be compressed and inflamed by a bulging disc or a tight spinal canal (spinal stenosis).  By injecting steroids into the epidural space, we can bring irritated nerves into direct contact with a potentially helpful medication.  These steroids act directly on the irritated nerves and can reduce swelling and inflammation which often leads to decreased pain.  Epidural steroids may be injected anywhere along the spine and from the neck to the low back depending upon the location of your pain.   After numbing the skin with local anesthetic (like Novocaine), a small needle is passed into the epidural space slowly.  You may experience a sensation of pressure while this is being done.  The entire block usually last less than 10 minutes.  Conditions which may be treated by epidural steroids:  Low back and leg pain Neck and arm pain Spinal stenosis Post-laminectomy syndrome Herpes zoster (shingles) pain Pain from compression fractures  Preparation for the injection:  Do not eat any solid food or dairy products within 8 hours of your appointment.  You may drink clear liquids up to 3 hours before appointment.  Clear liquids include water, black coffee, juice or soda.  No milk or cream please. You may take your regular medication, including pain medications, with a sip of water before your  appointment  Diabetics should hold regular insulin (if taken separately) and take 1/2 normal NPH dos the morning of the procedure.  Carry some sugar containing items with you to your appointment. A driver must accompany you and be prepared to drive you home after your procedure.  Bring all your current medications with your. An IV may be inserted and sedation may be given at the discretion of the physician.   A blood pressure cuff, EKG and other monitors will often be applied during the procedure.  Some patients may need to have extra oxygen administered for a short period. You will be asked to provide medical information, including your allergies, prior to the procedure.  We must know immediately if you are taking blood thinners (like Coumadin/Warfarin)  Or if you are allergic to IV iodine contrast (dye). We must know if you could possible be pregnant.  Possible side-effects: Bleeding from needle site Infection (rare, may require surgery) Nerve injury (rare) Numbness & tingling (temporary) Difficulty urinating (rare, temporary) Spinal headache ( a headache worse with upright posture) Light -headedness (temporary) Pain at injection site (several days) Decreased blood pressure (temporary) Weakness in arm/leg (temporary) Pressure sensation in back/neck (temporary)  Call if you experience: Fever/chills associated with headache or increased back/neck pain. Headache worsened by an upright position. New onset weakness or numbness of an extremity below the injection site Hives or difficulty breathing (go to the emergency room) Inflammation or drainage at the infection site Severe back/neck pain Any new symptoms which are concerning to you  Please note:  Although the local anesthetic injected can often make your back or neck feel good for several hours after the injection, the pain will likely return.  It takes 3-7 days for steroids to work in the epidural space.  You may not notice any pain  relief for at least that one week.  If effective, we will often do a series of three injections spaced 3-6 weeks apart to maximally decrease your pain.  After the initial series, we generally will wait several months before considering a repeat injection of the same type.  If you have any  questions, please call [phone] [facility]
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
## 2024-04-12 ENCOUNTER — Ambulatory Visit

## 2024-04-13 ENCOUNTER — Encounter: Payer: Self-pay | Admitting: *Deleted

## 2024-04-13 ENCOUNTER — Ambulatory Visit

## 2024-04-13 ENCOUNTER — Ambulatory Visit: Admitting: Student in an Organized Health Care Education/Training Program

## 2024-04-13 VITALS — BP 138/99 | HR 90 | Temp 98.1°F | Ht 71.0 in | Wt 135.0 lb

## 2024-04-13 DIAGNOSIS — R634 Abnormal weight loss: Secondary | ICD-10-CM

## 2024-04-13 DIAGNOSIS — R1032 Left lower quadrant pain: Secondary | ICD-10-CM

## 2024-04-13 DIAGNOSIS — G8929 Other chronic pain: Secondary | ICD-10-CM

## 2024-04-13 DIAGNOSIS — R159 Full incontinence of feces: Secondary | ICD-10-CM

## 2024-04-13 MED ORDER — DICYCLOMINE HCL 20 MG PO TABS: 20.0000 mg | ORAL_TABLET | ORAL | 0 refills | Status: AC | PRN

## 2024-04-13 MED ORDER — PEG 3350-KCL-NABCB-NACL-NASULF 236 G PO SOLR: 4000.0000 mL | Freq: Once | ORAL | 0 refills | Status: AC

## 2024-04-13 NOTE — Addendum Note (Signed)
 Addended by: Sheralyn Pinegar, CHAN on: 04/13/2024 04:45 PM   Modules accepted: Orders
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
## 2024-04-13 NOTE — Patient Instructions (Signed)
 Metamucil at 2 to 3 teaspoons at night mixed in 10 ounces of water 

## 2024-04-18 ENCOUNTER — Ambulatory Visit: Admitting: Student in an Organized Health Care Education/Training Program

## 2024-05-04 ENCOUNTER — Ambulatory Visit: Admit: 2024-05-04

## 2024-05-09 ENCOUNTER — Ambulatory Visit

## 2024-06-06 ENCOUNTER — Ambulatory Visit
